# Patient Record
Sex: Female | Born: 1945 | Race: White | Hispanic: No | Smoking: Current every day smoker
Health system: Southern US, Community
[De-identification: ages and names within clinical notes are randomized; demographics above are authoritative.]

## PROBLEM LIST (undated history)

## (undated) DIAGNOSIS — M1990 Unspecified osteoarthritis, unspecified site: Secondary | ICD-10-CM

## (undated) DIAGNOSIS — E7800 Pure hypercholesterolemia, unspecified: Secondary | ICD-10-CM

## (undated) DIAGNOSIS — C801 Malignant (primary) neoplasm, unspecified: Secondary | ICD-10-CM

## (undated) DIAGNOSIS — M069 Rheumatoid arthritis, unspecified: Secondary | ICD-10-CM

## (undated) DIAGNOSIS — H409 Unspecified glaucoma: Secondary | ICD-10-CM

## (undated) DIAGNOSIS — R030 Elevated blood-pressure reading, without diagnosis of hypertension: Secondary | ICD-10-CM

## (undated) DIAGNOSIS — H269 Unspecified cataract: Secondary | ICD-10-CM

## (undated) DIAGNOSIS — E039 Hypothyroidism, unspecified: Secondary | ICD-10-CM

## (undated) DIAGNOSIS — N6019 Diffuse cystic mastopathy of unspecified breast: Secondary | ICD-10-CM

## (undated) DIAGNOSIS — F419 Anxiety disorder, unspecified: Secondary | ICD-10-CM

## (undated) DIAGNOSIS — I10 Essential (primary) hypertension: Secondary | ICD-10-CM

## (undated) DIAGNOSIS — I4892 Unspecified atrial flutter: Secondary | ICD-10-CM

## (undated) DIAGNOSIS — E079 Disorder of thyroid, unspecified: Secondary | ICD-10-CM

## (undated) HISTORY — PX: TONSILLECTOMY: SHX5217

## (undated) HISTORY — DX: Unspecified glaucoma: H40.9

## (undated) HISTORY — DX: Diffuse cystic mastopathy of unspecified breast: N60.19

## (undated) HISTORY — DX: Disorder of thyroid, unspecified: E07.9

## (undated) HISTORY — DX: Rheumatoid arthritis, unspecified: M06.9

## (undated) HISTORY — DX: Essential (primary) hypertension: I10

## (undated) HISTORY — PX: EYE SURGERY: SHX253

## (undated) HISTORY — DX: Elevated blood-pressure reading, without diagnosis of hypertension: R03.0

## (undated) HISTORY — DX: Unspecified cataract: H26.9

## (undated) HISTORY — DX: Unspecified osteoarthritis, unspecified site: M19.90

## (undated) HISTORY — PX: TONSILLECTOMY: SUR1361

## (undated) HISTORY — PX: TUBAL LIGATION: SHX77

## (undated) HISTORY — DX: Pure hypercholesterolemia, unspecified: E78.00

## (undated) HISTORY — DX: Unspecified atrial flutter: I48.92

## (undated) HISTORY — PX: HEMORRHOID SURGERY: SHX153

## (undated) HISTORY — DX: Malignant (primary) neoplasm, unspecified: C80.1

## (undated) HISTORY — PX: ABDOMINAL HYSTERECTOMY: SHX81

---

## 1973-05-23 HISTORY — PX: OTHER SURGICAL HISTORY: SHX169

## 2005-07-28 ENCOUNTER — Ambulatory Visit: Payer: Self-pay | Admitting: Family Medicine

## 2010-06-22 ENCOUNTER — Ambulatory Visit: Payer: Self-pay

## 2011-01-19 ENCOUNTER — Ambulatory Visit: Payer: Self-pay | Admitting: Family Medicine

## 2012-10-22 DIAGNOSIS — M069 Rheumatoid arthritis, unspecified: Secondary | ICD-10-CM | POA: Insufficient documentation

## 2013-06-03 ENCOUNTER — Ambulatory Visit: Payer: Self-pay | Admitting: Rheumatology

## 2013-09-09 ENCOUNTER — Ambulatory Visit: Payer: Self-pay | Admitting: Hematology and Oncology

## 2013-09-20 ENCOUNTER — Ambulatory Visit: Payer: Self-pay | Admitting: Hematology and Oncology

## 2013-09-20 LAB — CBC CANCER CENTER
BASOS PCT: 1.4 %
Basophil #: 0.1 x10 3/mm (ref 0.0–0.1)
EOS ABS: 0.2 x10 3/mm (ref 0.0–0.7)
EOS PCT: 1.8 %
HCT: 46.3 % (ref 35.0–47.0)
HGB: 16.1 g/dL — ABNORMAL HIGH (ref 12.0–16.0)
Lymphocyte #: 2 x10 3/mm (ref 1.0–3.6)
Lymphocyte %: 24 %
MCH: 32.3 pg (ref 26.0–34.0)
MCHC: 34.8 g/dL (ref 32.0–36.0)
MCV: 93 fL (ref 80–100)
MONOS PCT: 6.6 %
Monocyte #: 0.6 x10 3/mm (ref 0.2–0.9)
Neutrophil #: 5.6 x10 3/mm (ref 1.4–6.5)
Neutrophil %: 66.2 %
PLATELETS: 112 x10 3/mm — AB (ref 150–440)
RBC: 4.99 10*6/uL (ref 3.80–5.20)
RDW: 13.1 % (ref 11.5–14.5)
WBC: 8.4 x10 3/mm (ref 3.6–11.0)

## 2013-10-07 DIAGNOSIS — D696 Thrombocytopenia, unspecified: Secondary | ICD-10-CM | POA: Insufficient documentation

## 2013-10-21 ENCOUNTER — Ambulatory Visit: Payer: Self-pay | Admitting: Hematology and Oncology

## 2015-01-27 ENCOUNTER — Telehealth: Payer: Self-pay | Admitting: Family Medicine

## 2015-01-27 DIAGNOSIS — C4490 Unspecified malignant neoplasm of skin, unspecified: Secondary | ICD-10-CM | POA: Insufficient documentation

## 2015-01-27 DIAGNOSIS — M1990 Unspecified osteoarthritis, unspecified site: Secondary | ICD-10-CM | POA: Insufficient documentation

## 2015-01-27 DIAGNOSIS — E039 Hypothyroidism, unspecified: Secondary | ICD-10-CM | POA: Insufficient documentation

## 2015-01-27 DIAGNOSIS — G4700 Insomnia, unspecified: Secondary | ICD-10-CM | POA: Insufficient documentation

## 2015-01-27 DIAGNOSIS — E7800 Pure hypercholesterolemia, unspecified: Secondary | ICD-10-CM | POA: Insufficient documentation

## 2015-01-27 DIAGNOSIS — M069 Rheumatoid arthritis, unspecified: Secondary | ICD-10-CM | POA: Insufficient documentation

## 2015-01-27 DIAGNOSIS — R030 Elevated blood-pressure reading, without diagnosis of hypertension: Secondary | ICD-10-CM | POA: Insufficient documentation

## 2015-01-27 DIAGNOSIS — F4320 Adjustment disorder, unspecified: Secondary | ICD-10-CM | POA: Insufficient documentation

## 2015-01-27 DIAGNOSIS — R748 Abnormal levels of other serum enzymes: Secondary | ICD-10-CM | POA: Insufficient documentation

## 2015-01-27 DIAGNOSIS — J309 Allergic rhinitis, unspecified: Secondary | ICD-10-CM | POA: Insufficient documentation

## 2015-01-27 DIAGNOSIS — D72829 Elevated white blood cell count, unspecified: Secondary | ICD-10-CM | POA: Insufficient documentation

## 2015-01-27 DIAGNOSIS — Z720 Tobacco use: Secondary | ICD-10-CM | POA: Insufficient documentation

## 2015-01-27 DIAGNOSIS — N6019 Diffuse cystic mastopathy of unspecified breast: Secondary | ICD-10-CM | POA: Insufficient documentation

## 2015-01-27 NOTE — Telephone Encounter (Signed)
Pt called sayin gyou wanted to recheck her thyroid.  She needs a lab slip to be picked up.  Call back is [phone]  Thanks teri

## 2015-01-27 NOTE — Telephone Encounter (Signed)
Pt's Synthroid was increase 09/2014.  Also her liver enzymes and cholesterol were elevated.  She didn't start want to start a statin yet, and she had an U/S that showed fatty liver.  Should her cholesterol and met C be recheck?  Thanks,   -Mickel Baas

## 2015-01-27 NOTE — Telephone Encounter (Signed)
Pt advised that her lab slip is at the front desk.   Thanks,   -Mickel Baas

## 2015-01-27 NOTE — Telephone Encounter (Signed)
OK to order all of the labs as below. Thanks.

## 2015-02-03 ENCOUNTER — Telehealth: Payer: Self-pay

## 2015-02-03 DIAGNOSIS — E039 Hypothyroidism, unspecified: Secondary | ICD-10-CM

## 2015-02-03 LAB — LIPID PANEL
CHOL/HDL RATIO: 6.5 ratio — AB (ref 0.0–4.4)
Cholesterol, Total: 239 mg/dL — ABNORMAL HIGH (ref 100–199)
HDL: 37 mg/dL — AB (ref >39)
LDL Calculated: 157 mg/dL — ABNORMAL HIGH (ref 0–99)
Triglycerides: 227 mg/dL — ABNORMAL HIGH (ref 0–149)
VLDL CHOLESTEROL CAL: 45 mg/dL — AB (ref 5–40)

## 2015-02-03 LAB — COMPREHENSIVE METABOLIC PANEL
ALT: 25 IU/L (ref 0–32)
AST: 30 IU/L (ref 0–40)
Albumin/Globulin Ratio: 1.7 (ref 1.1–2.5)
Albumin: 4.4 g/dL (ref 3.5–4.8)
Alkaline Phosphatase: 102 IU/L (ref 39–117)
BUN/Creatinine Ratio: 11 (ref 11–26)
BUN: 10 mg/dL (ref 8–27)
Bilirubin Total: 0.5 mg/dL (ref 0.0–1.2)
CALCIUM: 9.1 mg/dL (ref 8.7–10.3)
CO2: 22 mmol/L (ref 18–29)
CREATININE: 0.88 mg/dL (ref 0.57–1.00)
Chloride: 102 mmol/L (ref 97–108)
GFR, EST AFRICAN AMERICAN: 77 mL/min/{1.73_m2} (ref >59)
GFR, EST NON AFRICAN AMERICAN: 67 mL/min/{1.73_m2} (ref >59)
GLUCOSE: 103 mg/dL — AB (ref 65–99)
Globulin, Total: 2.6 g/dL (ref 1.5–4.5)
Potassium: 4.7 mmol/L (ref 3.5–5.2)
Sodium: 142 mmol/L (ref 134–144)
TOTAL PROTEIN: 7 g/dL (ref 6.0–8.5)

## 2015-02-03 LAB — TSH: TSH: 1.88 u[IU]/mL (ref 0.450–4.500)

## 2015-02-03 MED ORDER — LEVOTHYROXINE SODIUM 75 MCG PO TABS: 75.0000 ug | ORAL_TABLET | Freq: Every day | ORAL | Status: DC

## 2015-02-03 NOTE — Telephone Encounter (Signed)
Advised pt as directed below. Pt stated that she wants to wait a month before deciding because she has an appointment with Dr. Jefm Camberos next month for her arthritis. She stated that she will call [facility] to let us know.  She is also requesting synthroid medication refill.  Thanks,

## 2015-02-03 NOTE — Telephone Encounter (Signed)
-----   Message from Margarita Rana, MD sent at 02/03/2015  8:09 AM EDT ----- Blood sugar borderline.  Thyroid normal. Liver enzymes normal. Cholesterol elevated and 10 year risk of heart disease is 15 percent. Please see if patient would consider a low dose statin. Know she used to be on Zocor. Thanks.

## 2015-05-27 ENCOUNTER — Other Ambulatory Visit: Payer: Self-pay | Admitting: Family Medicine

## 2015-05-27 DIAGNOSIS — E039 Hypothyroidism, unspecified: Secondary | ICD-10-CM

## 2015-05-28 NOTE — Telephone Encounter (Signed)
Last OV 09/2014  Thanks,   -Laura  

## 2015-07-30 DIAGNOSIS — M2041 Other hammer toe(s) (acquired), right foot: Secondary | ICD-10-CM | POA: Diagnosis not present

## 2015-07-30 DIAGNOSIS — M2010 Hallux valgus (acquired), unspecified foot: Secondary | ICD-10-CM | POA: Diagnosis not present

## 2015-07-30 DIAGNOSIS — M79671 Pain in right foot: Secondary | ICD-10-CM | POA: Diagnosis not present

## 2015-07-30 DIAGNOSIS — M79672 Pain in left foot: Secondary | ICD-10-CM | POA: Diagnosis not present

## 2015-07-30 DIAGNOSIS — M2042 Other hammer toe(s) (acquired), left foot: Secondary | ICD-10-CM | POA: Diagnosis not present

## 2015-08-10 DIAGNOSIS — M0579 Rheumatoid arthritis with rheumatoid factor of multiple sites without organ or systems involvement: Secondary | ICD-10-CM | POA: Diagnosis not present

## 2015-08-10 DIAGNOSIS — Z79899 Other long term (current) drug therapy: Secondary | ICD-10-CM | POA: Diagnosis not present

## 2015-08-13 ENCOUNTER — Encounter: Payer: Self-pay | Admitting: Physician Assistant

## 2015-08-13 ENCOUNTER — Ambulatory Visit (INDEPENDENT_AMBULATORY_CARE_PROVIDER_SITE_OTHER): Payer: PPO | Admitting: Physician Assistant

## 2015-08-13 VITALS — BP 140/70 | HR 83 | Temp 98.8°F | Resp 16 | Wt 114.4 lb

## 2015-08-13 DIAGNOSIS — J069 Acute upper respiratory infection, unspecified: Secondary | ICD-10-CM

## 2015-08-13 DIAGNOSIS — R05 Cough: Secondary | ICD-10-CM

## 2015-08-13 DIAGNOSIS — R059 Cough, unspecified: Secondary | ICD-10-CM

## 2015-08-13 MED ORDER — HYDROCODONE-HOMATROPINE 5-1.5 MG/5ML PO SYRP: 5.0000 mL | ORAL_SOLUTION | Freq: Three times a day (TID) | ORAL | Status: DC | PRN

## 2015-08-13 MED ORDER — AZITHROMYCIN 250 MG PO TABS: ORAL_TABLET | ORAL | Status: DC

## 2015-08-13 NOTE — Patient Instructions (Signed)
Upper Respiratory Infection, Adult Most upper respiratory infections (URIs) are a viral infection of the air passages leading to the lungs. A URI affects the nose, throat, and upper air passages. The most common type of URI is nasopharyngitis and is typically referred to as "the common cold." URIs run their course and usually go away on their own. Most of the time, a URI does not require medical attention, but sometimes a bacterial infection in the upper airways can follow a viral infection. This is called a secondary infection. Sinus and middle ear infections are common types of secondary upper respiratory infections. Bacterial pneumonia can also complicate a URI. A URI can worsen asthma and chronic obstructive pulmonary disease (COPD). Sometimes, these complications can require emergency medical care and may be life threatening.  CAUSES Almost all URIs are caused by viruses. A virus is a type of germ and can spread from one person to another.  RISKS FACTORS You may be at risk for a URI if:   You smoke.   You have chronic heart or lung disease.  You have a weakened defense (immune) system.   You are very young or very old.   You have nasal allergies or asthma.  You work in crowded or poorly ventilated areas.  You work in health care facilities or schools. SIGNS AND SYMPTOMS  Symptoms typically develop 2-3 days after you come in contact with a cold virus. Most viral URIs last 7-10 days. However, viral URIs from the influenza virus (flu virus) can last 14-18 days and are typically more severe. Symptoms may include:   Runny or stuffy (congested) nose.   Sneezing.   Cough.   Sore throat.   Headache.   Fatigue.   Fever.   Loss of appetite.   Pain in your forehead, behind your eyes, and over your cheekbones (sinus pain).  Muscle aches.  DIAGNOSIS  Your health care provider may diagnose a URI by:  Physical exam.  Tests to check that your symptoms are not due to  another condition such as:  Strep throat.  Sinusitis.  Pneumonia.  Asthma. TREATMENT  A URI goes away on its own with time. It cannot be cured with medicines, but medicines may be prescribed or recommended to relieve symptoms. Medicines may help:  Reduce your fever.  Reduce your cough.  Relieve nasal congestion. HOME CARE INSTRUCTIONS   Take medicines only as directed by your health care provider.   Gargle warm saltwater or take cough drops to comfort your throat as directed by your health care provider.  Use a warm mist humidifier or inhale steam from a shower to increase air moisture. This may make it easier to breathe.  Drink enough fluid to keep your urine clear or pale yellow.   Eat soups and other clear broths and maintain good nutrition.   Rest as needed.   Return to work when your temperature has returned to normal or as your health care provider advises. You may need to stay home longer to avoid infecting others. You can also use a face mask and careful hand washing to prevent spread of the virus.  Increase the usage of your inhaler if you have asthma.   Do not use any tobacco products, including cigarettes, chewing tobacco, or electronic cigarettes. If you need help quitting, ask your health care provider. PREVENTION  The best way to protect yourself from getting a cold is to practice good hygiene.   Avoid oral or hand contact with people with cold   symptoms.   Wash your hands often if contact occurs.  There is no clear evidence that vitamin C, vitamin E, echinacea, or exercise reduces the chance of developing a cold. However, it is always recommended to get plenty of rest, exercise, and practice good nutrition.  SEEK MEDICAL CARE IF:   You are getting worse rather than better.   Your symptoms are not controlled by medicine.   You have chills.  You have worsening shortness of breath.  You have brown or red mucus.  You have yellow or brown nasal  discharge.  You have pain in your face, especially when you bend forward.  You have a fever.  You have swollen neck glands.  You have pain while swallowing.  You have white areas in the back of your throat. SEEK IMMEDIATE MEDICAL CARE IF:   You have severe or persistent:  Headache.  Ear pain.  Sinus pain.  Chest pain.  You have chronic lung disease and any of the following:  Wheezing.  Prolonged cough.  Coughing up blood.  A change in your usual mucus.  You have a stiff neck.  You have changes in your:  Vision.  Hearing.  Thinking.  Mood. MAKE SURE YOU:   Understand these instructions.  Will watch your condition.  Will get help right away if you are not doing well or get worse.   This information is not intended to replace advice given to you by your health care provider. Make sure you discuss any questions you have with your health care provider.   Document Released: 11/02/2000 Document Revised: 09/23/2014 Document Reviewed: 08/14/2013 Elsevier Interactive Patient Education 2016 Elsevier Inc.  

## 2015-08-13 NOTE — Progress Notes (Signed)
Patient: Tamara Whitehead Female    DOB: 02-06-1946   70 y.o.   MRN: [id number] Visit Date: 08/13/2015  Today's Provider: Mar Daring, PA-C   Chief Complaint  Patient presents with  . URI   Subjective:    URI  This is a new problem. The current episode started in the past 7 days (Monday night). The problem has been gradually worsening. Maximum temperature: Low grade fever; unmeasured. Associated symptoms include congestion, coughing, ear pain (right ear  in the last few days but it feels better), nausea, rhinorrhea, sinus pain, sneezing and a sore throat (scratchy from the postnasal drip on and off). Pertinent negatives include no abdominal pain, chest pain, diarrhea, headaches, neck pain, plugged ear sensation, rash, vomiting or wheezing. She has tried decongestant and NSAIDs (Mucinex, Robitussin cough syrup, Advil, and Aleve) for the symptoms. The treatment provided no relief.   She does have RA and is getting ready to start humira.  She is worried that if she is sick she will not be able to start her humira.  She is currently on other immunosuppressants but they are not offering her full relief.  She also does have seasonal allergies.  She takes Human resources officer as needed. She has not yet started her allergy medication back at this time.  She also is a smoker.  She does have a history of bronchitis and sinus infections.    Allergies  Allergen Reactions  . Codeine Nausea And Vomiting    Can take cough syrups   Previous Medications   CALCIUM CARBONATE (CALCIUM 600 PO)    Take 600 mg by mouth daily.   COENZYME Q-10 100 MG CAPSULE    Take 100 mg by mouth daily.   FEXOFENADINE (ALLEGRA ALLERGY) 180 MG TABLET    Take by mouth.   FOLIC ACID (FOLVITE) 443 MCG TABLET    Take by mouth.   HYDROXYCHLOROQUINE (PLAQUENIL) 200 MG TABLET    TK 2 TS PO ONCE DAILY   IBUPROFEN (ADVIL,MOTRIN) 200 MG TABLET    Take by mouth.   LATANOPROST (XALATAN) 0.005 % OPHTHALMIC SOLUTION    Apply to eye.   LEFLUNOMIDE (ARAVA) 10 MG TABLET    Take by mouth.   LEVOTHYROXINE (SYNTHROID, LEVOTHROID) 75 MCG TABLET    TAKE ONE TABLET BY MOUTH EVERY DAY   MULTIPLE VITAMIN TABLET    Take by mouth.   NAPROXEN SODIUM (ALEVE) 220 MG TABLET    Take 220 mg by mouth 2 (two) times daily with a meal.   VITAMIN C (ASCORBIC ACID) 500 MG TABLET    Take by mouth.   VITAMIN E 400 UNIT CAPSULE    Take by mouth.    Review of Systems  Constitutional: Positive for chills.  HENT: Positive for congestion, ear pain (right ear  in the last few days but it feels better), postnasal drip, rhinorrhea, sinus pressure, sneezing and sore throat (scratchy from the postnasal drip on and off). Negative for trouble swallowing.   Respiratory: Positive for cough, chest tightness and shortness of breath (a little). Negative for wheezing.   Cardiovascular: Negative for chest pain, palpitations and leg swelling.  Gastrointestinal: Positive for nausea. Negative for vomiting, abdominal pain and diarrhea.  Musculoskeletal: Negative for neck pain.  Skin: Negative for rash.  Neurological: Negative for dizziness and headaches.    Social History  Substance Use Topics  . Smoking status: Current Every Day Smoker -- 0.25 packs/day    Types: Cigarettes  .  Smokeless tobacco: Not on file  . Alcohol Use: Yes     Comment: wine occasional   Objective:   BP 140/70 mmHg  Pulse 83  Temp(Src) 98.8 F (37.1 C) (Oral)  Resp 16  Wt 114 lb 6.4 oz (51.891 kg)  SpO2 96%  Physical Exam  Constitutional: She appears well-developed and well-nourished. No distress.  HENT:  Head: Normocephalic and atraumatic.  Right Ear: Hearing, external ear and ear canal normal. Tympanic membrane is not erythematous and not bulging. A middle ear effusion is present.  Left Ear: Hearing, external ear and ear canal normal. Tympanic membrane is not erythematous and not bulging. A middle ear effusion is present.  Nose: Mucosal edema and rhinorrhea present. Right sinus  exhibits no maxillary sinus tenderness and no frontal sinus tenderness. Left sinus exhibits no maxillary sinus tenderness and no frontal sinus tenderness.  Mouth/Throat: Uvula is midline, oropharynx is clear and moist and mucous membranes are normal. No oropharyngeal exudate, posterior oropharyngeal edema or posterior oropharyngeal erythema.  Eyes: Conjunctivae are normal. Pupils are equal, round, and reactive to light. Right eye exhibits no discharge. Left eye exhibits no discharge. No scleral icterus.  Neck: Normal range of motion. Neck supple. No tracheal deviation present. No thyromegaly present.  Cardiovascular: Normal rate, regular rhythm and normal heart sounds.  Exam reveals no gallop and no friction rub.   No murmur heard. Pulmonary/Chest: Effort normal and breath sounds normal. No stridor. No respiratory distress. She has no wheezes. She has no rales.  Lymphadenopathy:    She has no cervical adenopathy.  Skin: Skin is warm and dry. She is not diaphoretic.  Vitals reviewed.       Assessment & Plan:     1. Upper respiratory infection Worsening symptoms. Due to her immunosuppression treatment for her rheumatoid arthritis I will treat her with a Z-Pak as below. I did advise her to go ahead and start her Allegra as well being that allergy season is already started. She may continue Mucinex DM for congestion and daytime cough. I will give Hycodan cough syrup as below for nighttime cough. She needs to make sure she stays well hydrated. She may use Tylenol as needed for fever and body aches. She is to call the office if symptoms fail to improve or worsen. - azithromycin (ZITHROMAX) 250 MG tablet; Take 2 tablets PO on day one, and one tablet PO daily thereafter until completed.  Dispense: 6 tablet; Refill: 0  2. Cough See above medical treatment plan. - HYDROcodone-homatropine (HYCODAN) 5-1.5 MG/5ML syrup; Take 5 mLs by mouth every 8 (eight) hours as needed.  Dispense: 180 mL; Refill: 0         Mar Daring, PA-C  [facility]

## 2015-08-17 DIAGNOSIS — M0579 Rheumatoid arthritis with rheumatoid factor of multiple sites without organ or systems involvement: Secondary | ICD-10-CM | POA: Diagnosis not present

## 2015-08-17 DIAGNOSIS — D696 Thrombocytopenia, unspecified: Secondary | ICD-10-CM | POA: Diagnosis not present

## 2015-08-17 DIAGNOSIS — Z79899 Other long term (current) drug therapy: Secondary | ICD-10-CM | POA: Diagnosis not present

## 2015-11-16 DIAGNOSIS — M0579 Rheumatoid arthritis with rheumatoid factor of multiple sites without organ or systems involvement: Secondary | ICD-10-CM | POA: Diagnosis not present

## 2015-11-16 DIAGNOSIS — Z79899 Other long term (current) drug therapy: Secondary | ICD-10-CM | POA: Diagnosis not present

## 2015-11-30 DIAGNOSIS — D751 Secondary polycythemia: Secondary | ICD-10-CM | POA: Diagnosis not present

## 2015-11-30 DIAGNOSIS — D696 Thrombocytopenia, unspecified: Secondary | ICD-10-CM | POA: Diagnosis not present

## 2015-11-30 DIAGNOSIS — M0579 Rheumatoid arthritis with rheumatoid factor of multiple sites without organ or systems involvement: Secondary | ICD-10-CM | POA: Diagnosis not present

## 2015-12-05 ENCOUNTER — Other Ambulatory Visit: Payer: Self-pay | Admitting: Family Medicine

## 2015-12-05 DIAGNOSIS — E039 Hypothyroidism, unspecified: Secondary | ICD-10-CM

## 2016-03-28 DIAGNOSIS — M0579 Rheumatoid arthritis with rheumatoid factor of multiple sites without organ or systems involvement: Secondary | ICD-10-CM | POA: Diagnosis not present

## 2016-03-28 DIAGNOSIS — D696 Thrombocytopenia, unspecified: Secondary | ICD-10-CM | POA: Diagnosis not present

## 2016-03-28 DIAGNOSIS — D751 Secondary polycythemia: Secondary | ICD-10-CM | POA: Diagnosis not present

## 2016-03-28 DIAGNOSIS — Z79899 Other long term (current) drug therapy: Secondary | ICD-10-CM | POA: Diagnosis not present

## 2016-04-04 DIAGNOSIS — D696 Thrombocytopenia, unspecified: Secondary | ICD-10-CM | POA: Diagnosis not present

## 2016-04-04 DIAGNOSIS — M79671 Pain in right foot: Secondary | ICD-10-CM | POA: Diagnosis not present

## 2016-04-04 DIAGNOSIS — M0579 Rheumatoid arthritis with rheumatoid factor of multiple sites without organ or systems involvement: Secondary | ICD-10-CM | POA: Diagnosis not present

## 2016-04-04 DIAGNOSIS — G8929 Other chronic pain: Secondary | ICD-10-CM | POA: Diagnosis not present

## 2016-05-25 DIAGNOSIS — H401131 Primary open-angle glaucoma, bilateral, mild stage: Secondary | ICD-10-CM | POA: Diagnosis not present

## 2016-06-19 ENCOUNTER — Other Ambulatory Visit: Payer: Self-pay | Admitting: Physician Assistant

## 2016-06-19 DIAGNOSIS — E039 Hypothyroidism, unspecified: Secondary | ICD-10-CM

## 2016-06-20 NOTE — Telephone Encounter (Signed)
Last ov 08/13/15 Last filled 12/06/05 Please review. Thank you. sd

## 2016-10-05 ENCOUNTER — Encounter: Payer: PPO | Admitting: Physician Assistant

## 2016-10-05 DIAGNOSIS — M0579 Rheumatoid arthritis with rheumatoid factor of multiple sites without organ or systems involvement: Secondary | ICD-10-CM | POA: Diagnosis not present

## 2016-10-10 DIAGNOSIS — M0579 Rheumatoid arthritis with rheumatoid factor of multiple sites without organ or systems involvement: Secondary | ICD-10-CM | POA: Diagnosis not present

## 2016-10-10 DIAGNOSIS — M79642 Pain in left hand: Secondary | ICD-10-CM | POA: Diagnosis not present

## 2016-10-10 DIAGNOSIS — D696 Thrombocytopenia, unspecified: Secondary | ICD-10-CM | POA: Diagnosis not present

## 2016-11-07 ENCOUNTER — Encounter: Payer: Self-pay | Admitting: Family Medicine

## 2016-11-07 ENCOUNTER — Ambulatory Visit (INDEPENDENT_AMBULATORY_CARE_PROVIDER_SITE_OTHER): Payer: PPO | Admitting: Family Medicine

## 2016-11-07 VITALS — BP 160/84 | HR 85 | Temp 99.5°F | Ht 61.0 in | Wt 116.8 lb

## 2016-11-07 DIAGNOSIS — R030 Elevated blood-pressure reading, without diagnosis of hypertension: Secondary | ICD-10-CM | POA: Diagnosis not present

## 2016-11-07 DIAGNOSIS — E039 Hypothyroidism, unspecified: Secondary | ICD-10-CM

## 2016-11-07 DIAGNOSIS — L989 Disorder of the skin and subcutaneous tissue, unspecified: Secondary | ICD-10-CM | POA: Diagnosis not present

## 2016-11-07 DIAGNOSIS — M069 Rheumatoid arthritis, unspecified: Secondary | ICD-10-CM | POA: Diagnosis not present

## 2016-11-07 DIAGNOSIS — Z13220 Encounter for screening for lipoid disorders: Secondary | ICD-10-CM | POA: Diagnosis not present

## 2016-11-07 NOTE — Assessment & Plan Note (Signed)
Return for TSH.

## 2016-11-07 NOTE — Assessment & Plan Note (Signed)
She does have 2 seborrheic keratoses. Potentially cutaneous horns on her right arm. We'll have her see dermatology to ensure no malignant process.

## 2016-11-07 NOTE — Assessment & Plan Note (Signed)
Elevated today. I have reviewed her last several vital signs through rheumatology and all her blood pressures are normal. She reports elevated blood pressures in the past when she has been anxious the doctor's offices. Discuss having her check at home and if it starts to become elevated letting us know.

## 2016-11-07 NOTE — Assessment & Plan Note (Signed)
Followed by rheumatology. Continue to follow them.

## 2016-11-07 NOTE — Patient Instructions (Signed)
Nice to meet you. We will have you return for fasting lab work. We'll get you referred to dermatology.

## 2016-11-07 NOTE — Progress Notes (Signed)
Tommi Rumps, MD Phone: [phone]  Tamara Whitehead is a 71 y.o. female who presents today for new patient visit.  Hypothyroidism: Taking Synthroid. No weight changes. No heat or cold intolerance.  Rheumatoid arthritis: Followed by rheumatology. Notes this has been getting a little worse. She didn't do very well with the cold weather this last winter. Currently on Plaquenil and Humira.  Glaucoma: Currently following with ophthalmology. Currently on eyedrops. No recent changes.  No history of hypertension. Blood pressure is typically well controlled. Most recent check at rheumatology was in the 120s over 53s.  Patient notes some rough areas on her skin. They've come up relatively recently. She does have a history of skin cancer removed from her neck previously. Does have several seborrheic keratoses as well.  Active Ambulatory Problems    Diagnosis Date Noted  . Adaptation reaction 01/27/2015  . Allergic rhinitis 01/27/2015  . Current tobacco use 01/27/2015  . Elevated blood-pressure reading without diagnosis of hypertension 01/27/2015  . Leukocytosis 01/27/2015  . Bloodgood disease 01/27/2015  . Hypercholesteremia 01/27/2015  . Adult hypothyroidism 01/27/2015  . Cannot sleep 01/27/2015  . Osteoarthrosis 01/27/2015  . Arthritis or polyarthritis, rheumatoid ([place]) 01/27/2015  . Malignant neoplasm of skin 01/27/2015  . Elevated liver enzymes 01/27/2015  . Thrombocytopenia ([place]) 10/07/2013  . Rheumatoid arthritis ([place]) 10/22/2012  . Skin lesion 11/07/2016   Resolved Ambulatory Problems    Diagnosis Date Noted  . No Resolved Ambulatory Problems   Past Medical History:  Diagnosis Date  . Arthritis   . Cancer ([place])   . Cataract   . Elevated blood pressure   . Fibrocystic breast disease   . Glaucoma   . Hypercholesterolemia   . Thyroid disease     Family History  Problem Relation Age of Onset  . CAD Mother   . Arthritis Mother        Rheumatoid Arthritis  .  Arthritis Father        Rheumatoid/Osteoarthritis and Blindness, bleeding Tendencies,Glaucoma  . CAD Father   . Hypertension Maternal Grandmother   . COPD Neg Hx     Social History   Social History  . Marital status: Single    Spouse name: N/A  . Number of children: N/A  . Years of education: N/A   Occupational History  . Not on file.   Social History Main Topics  . Smoking status: Current Every Day Smoker    Packs/day: 0.25    Types: Cigarettes  . Smokeless tobacco: Never Used  . Alcohol use Yes     Comment: wine occasional  . Drug use: No  . Sexual activity: Not on file   Other Topics Concern  . Not on file   Social History Narrative  . No narrative on file    ROS  General:  Negative for nexplained weight loss, fever Skin: Negative for new or changing mole, sore that won't heal HEENT: Negative for trouble hearing, trouble seeing, ringing in ears, mouth sores, hoarseness, change in voice, dysphagia. CV:  Negative for chest pain, dyspnea, edema, palpitations Resp: Negative for cough, dyspnea, hemoptysis GI: Negative for nausea, vomiting, diarrhea, constipation, abdominal pain, melena, hematochezia. GU: Negative for dysuria, incontinence, urinary hesitance, hematuria, vaginal or penile discharge, polyuria, sexual difficulty, lumps in testicle or breasts MSK: Negative for muscle cramps or aches, joint pain or swelling Neuro: Negative for headaches, weakness, numbness, dizziness, passing out/fainting Psych: Negative for depression, anxiety, memory problems  Objective  Physical Exam Vitals:   11/07/16 1420  BP: Marland Kitchen)  160/84  Pulse: 85  Temp: 99.5 F (37.5 C)    BP Readings from Last 3 Encounters:  11/07/16 (!) 160/84  08/13/15 140/70   Wt Readings from Last 3 Encounters:  11/07/16 116 lb 12.8 oz (53 kg)  08/13/15 114 lb 6.4 oz (51.9 kg)    Physical Exam  Constitutional: No distress.  HENT:  Head: Normocephalic and atraumatic.  Mouth/Throat: Oropharynx  is clear and moist. No oropharyngeal exudate.  Eyes: Conjunctivae are normal. Pupils are equal, round, and reactive to light.  Cardiovascular: Normal rate, regular rhythm and normal heart sounds.   Pulmonary/Chest: Effort normal and breath sounds normal.  Abdominal: Soft. Bowel sounds are normal. She exhibits no distension. There is no tenderness. There is no rebound and no guarding.  Musculoskeletal: She exhibits no edema.  Neurological: She is alert. Gait normal.  Skin: Skin is warm and dry. She is not diaphoretic.  Seborrheic keratosis on posterior aspect of each knee, possible cutaneous horn is still right dorsal forearm and proximal right bicep  Psychiatric: Mood and affect normal.     Assessment/Plan:   Adult hypothyroidism Return for TSH.  Rheumatoid arthritis ([place]) Followed by rheumatology. Continue to follow them.  Elevated blood-pressure reading without diagnosis of hypertension Elevated today. I have reviewed her last several vital signs through rheumatology and all her blood pressures are normal. She reports elevated blood pressures in the past when she has been anxious the doctor's offices. Discuss having her check at home and if it starts to become elevated letting us know.  Skin lesion She does have 2 seborrheic keratoses. Potentially cutaneous horns on her right arm. We'll have her see dermatology to ensure no malignant process.   Orders Placed This Encounter  Procedures  . TSH    Standing Status:   Future    Standing Expiration Date:   11/07/2017  . Comp Met (CMET)    Standing Status:   Future    Standing Expiration Date:   11/07/2017  . Lipid panel    Standing Status:   Future    Standing Expiration Date:   11/07/2017  . Ambulatory referral to Dermatology    Referral Priority:   Routine    Referral Type:   Consultation    Referral Reason:   Specialty Services Required    Requested Specialty:   Dermatology    Number of Visits Requested:   1   Tommi Rumps, MD [place]

## 2016-11-14 ENCOUNTER — Other Ambulatory Visit (INDEPENDENT_AMBULATORY_CARE_PROVIDER_SITE_OTHER): Payer: PPO

## 2016-11-14 DIAGNOSIS — Z13220 Encounter for screening for lipoid disorders: Secondary | ICD-10-CM

## 2016-11-14 DIAGNOSIS — E039 Hypothyroidism, unspecified: Secondary | ICD-10-CM

## 2016-11-14 LAB — COMPREHENSIVE METABOLIC PANEL
ALK PHOS: 71 U/L (ref 39–117)
ALT: 27 U/L (ref 0–35)
AST: 32 U/L (ref 0–37)
Albumin: 4.5 g/dL (ref 3.5–5.2)
BUN: 17 mg/dL (ref 6–23)
CO2: 26 mEq/L (ref 19–32)
Calcium: 9.8 mg/dL (ref 8.4–10.5)
Chloride: 106 mEq/L (ref 96–112)
Creatinine, Ser: 0.99 mg/dL (ref 0.40–1.20)
GFR: 58.63 mL/min — ABNORMAL LOW (ref >60.00)
GLUCOSE: 113 mg/dL — AB (ref 70–99)
POTASSIUM: 4.5 meq/L (ref 3.5–5.1)
Sodium: 138 mEq/L (ref 135–145)
TOTAL PROTEIN: 7.2 g/dL (ref 6.0–8.3)
Total Bilirubin: 0.4 mg/dL (ref 0.2–1.2)

## 2016-11-14 LAB — LIPID PANEL
Cholesterol: 244 mg/dL — ABNORMAL HIGH (ref 0–200)
HDL: 41.2 mg/dL (ref >39.00)
LDL Cholesterol: 180 mg/dL — ABNORMAL HIGH (ref 0–99)
NONHDL: 202.68
Total CHOL/HDL Ratio: 6
Triglycerides: 111 mg/dL (ref 0.0–149.0)
VLDL: 22.2 mg/dL (ref 0.0–40.0)

## 2016-11-14 LAB — TSH: TSH: 1.54 u[IU]/mL (ref 0.35–4.50)

## 2016-11-16 ENCOUNTER — Other Ambulatory Visit: Payer: Self-pay | Admitting: Family Medicine

## 2016-11-16 DIAGNOSIS — E785 Hyperlipidemia, unspecified: Secondary | ICD-10-CM

## 2016-11-16 DIAGNOSIS — R7309 Other abnormal glucose: Secondary | ICD-10-CM

## 2016-11-16 MED ORDER — ROSUVASTATIN CALCIUM 20 MG PO TABS: 20.0000 mg | ORAL_TABLET | Freq: Every day | ORAL | 3 refills | Status: DC

## 2016-12-05 DIAGNOSIS — H401131 Primary open-angle glaucoma, bilateral, mild stage: Secondary | ICD-10-CM | POA: Diagnosis not present

## 2016-12-12 ENCOUNTER — Other Ambulatory Visit: Payer: Self-pay | Admitting: Family Medicine

## 2016-12-12 ENCOUNTER — Other Ambulatory Visit (INDEPENDENT_AMBULATORY_CARE_PROVIDER_SITE_OTHER): Payer: PPO

## 2016-12-12 DIAGNOSIS — E785 Hyperlipidemia, unspecified: Secondary | ICD-10-CM

## 2016-12-12 DIAGNOSIS — R945 Abnormal results of liver function studies: Principal | ICD-10-CM

## 2016-12-12 DIAGNOSIS — R7309 Other abnormal glucose: Secondary | ICD-10-CM

## 2016-12-12 DIAGNOSIS — R7989 Other specified abnormal findings of blood chemistry: Secondary | ICD-10-CM

## 2016-12-12 LAB — HEPATIC FUNCTION PANEL
ALT: 42 U/L — AB (ref 0–35)
AST: 42 U/L — ABNORMAL HIGH (ref 0–37)
Albumin: 4.7 g/dL (ref 3.5–5.2)
Alkaline Phosphatase: 53 U/L (ref 39–117)
BILIRUBIN TOTAL: 0.8 mg/dL (ref 0.2–1.2)
Bilirubin, Direct: 0.1 mg/dL (ref 0.0–0.3)
Total Protein: 7.2 g/dL (ref 6.0–8.3)

## 2016-12-12 LAB — LDL CHOLESTEROL, DIRECT: LDL DIRECT: 59 mg/dL

## 2016-12-12 LAB — HEMOGLOBIN A1C: Hgb A1c MFr Bld: 5.3 % (ref 4.6–6.5)

## 2016-12-27 ENCOUNTER — Telehealth: Payer: Self-pay | Admitting: *Deleted

## 2016-12-27 DIAGNOSIS — E039 Hypothyroidism, unspecified: Secondary | ICD-10-CM

## 2016-12-27 MED ORDER — LEVOTHYROXINE SODIUM 75 MCG PO TABS: 75.0000 ug | ORAL_TABLET | Freq: Every day | ORAL | 1 refills | Status: DC

## 2016-12-27 NOTE — Telephone Encounter (Signed)
Pt requested a medication refill for levothyroxine  Pharmacy [facility] [address]

## 2017-01-09 ENCOUNTER — Other Ambulatory Visit (INDEPENDENT_AMBULATORY_CARE_PROVIDER_SITE_OTHER): Payer: PPO

## 2017-01-09 DIAGNOSIS — R7989 Other specified abnormal findings of blood chemistry: Secondary | ICD-10-CM

## 2017-01-09 DIAGNOSIS — R945 Abnormal results of liver function studies: Principal | ICD-10-CM

## 2017-01-09 LAB — HEPATIC FUNCTION PANEL
ALT: 40 U/L — AB (ref 0–35)
AST: 39 U/L — AB (ref 0–37)
Albumin: 4.3 g/dL (ref 3.5–5.2)
Alkaline Phosphatase: 58 U/L (ref 39–117)
BILIRUBIN DIRECT: 0.2 mg/dL (ref 0.0–0.3)
BILIRUBIN TOTAL: 0.7 mg/dL (ref 0.2–1.2)
TOTAL PROTEIN: 7.8 g/dL (ref 6.0–8.3)

## 2017-01-14 ENCOUNTER — Other Ambulatory Visit: Payer: Self-pay | Admitting: Family Medicine

## 2017-01-14 DIAGNOSIS — R945 Abnormal results of liver function studies: Principal | ICD-10-CM

## 2017-01-14 DIAGNOSIS — R7989 Other specified abnormal findings of blood chemistry: Secondary | ICD-10-CM

## 2017-01-16 NOTE — Progress Notes (Signed)
Pt scheduled for 9/10 @ 8:30 am-mychart message sent with appt info

## 2017-01-30 ENCOUNTER — Ambulatory Visit
Admission: RE | Admit: 2017-01-30 | Discharge: 2017-01-30 | Disposition: A | Discharge status: Home / Self Care | Payer: PPO | Source: Ambulatory Visit | Attending: Family Medicine | Admitting: Family Medicine

## 2017-01-30 DIAGNOSIS — K838 Other specified diseases of biliary tract: Secondary | ICD-10-CM | POA: Diagnosis not present

## 2017-01-30 DIAGNOSIS — R945 Abnormal results of liver function studies: Secondary | ICD-10-CM

## 2017-01-30 DIAGNOSIS — R7989 Other specified abnormal findings of blood chemistry: Secondary | ICD-10-CM | POA: Diagnosis not present

## 2017-01-31 ENCOUNTER — Telehealth: Payer: Self-pay | Admitting: Family Medicine

## 2017-01-31 ENCOUNTER — Other Ambulatory Visit: Payer: Self-pay | Admitting: Family Medicine

## 2017-01-31 DIAGNOSIS — K838 Other specified diseases of biliary tract: Secondary | ICD-10-CM

## 2017-01-31 DIAGNOSIS — R945 Abnormal results of liver function studies: Principal | ICD-10-CM

## 2017-01-31 DIAGNOSIS — R7989 Other specified abnormal findings of blood chemistry: Secondary | ICD-10-CM

## 2017-01-31 NOTE — Telephone Encounter (Signed)
Patient advised of lab results see labs for doucmentation

## 2017-01-31 NOTE — Telephone Encounter (Signed)
Pt called to return your call.  Call pt @ work [phone]. Thank you!

## 2017-02-01 ENCOUNTER — Other Ambulatory Visit: Payer: PPO

## 2017-02-01 DIAGNOSIS — D2271 Melanocytic nevi of right lower limb, including hip: Secondary | ICD-10-CM | POA: Diagnosis not present

## 2017-02-01 DIAGNOSIS — B079 Viral wart, unspecified: Secondary | ICD-10-CM | POA: Diagnosis not present

## 2017-02-01 DIAGNOSIS — D2272 Melanocytic nevi of left lower limb, including hip: Secondary | ICD-10-CM | POA: Diagnosis not present

## 2017-02-01 DIAGNOSIS — D2261 Melanocytic nevi of right upper limb, including shoulder: Secondary | ICD-10-CM | POA: Diagnosis not present

## 2017-02-01 DIAGNOSIS — D485 Neoplasm of uncertain behavior of skin: Secondary | ICD-10-CM | POA: Diagnosis not present

## 2017-02-01 DIAGNOSIS — B078 Other viral warts: Secondary | ICD-10-CM | POA: Diagnosis not present

## 2017-02-01 DIAGNOSIS — D225 Melanocytic nevi of trunk: Secondary | ICD-10-CM | POA: Diagnosis not present

## 2017-03-14 ENCOUNTER — Other Ambulatory Visit (INDEPENDENT_AMBULATORY_CARE_PROVIDER_SITE_OTHER): Payer: PPO

## 2017-03-14 ENCOUNTER — Encounter: Payer: Self-pay | Admitting: Gastroenterology

## 2017-03-14 ENCOUNTER — Ambulatory Visit (INDEPENDENT_AMBULATORY_CARE_PROVIDER_SITE_OTHER): Payer: PPO | Admitting: Gastroenterology

## 2017-03-14 VITALS — BP 174/67 | HR 86 | Temp 97.4°F | Ht 61.0 in | Wt 111.0 lb

## 2017-03-14 DIAGNOSIS — K838 Other specified diseases of biliary tract: Secondary | ICD-10-CM | POA: Diagnosis not present

## 2017-03-14 DIAGNOSIS — R945 Abnormal results of liver function studies: Secondary | ICD-10-CM

## 2017-03-14 DIAGNOSIS — R7989 Other specified abnormal findings of blood chemistry: Secondary | ICD-10-CM

## 2017-03-14 NOTE — Patient Instructions (Addendum)
You are scheduled for an MRCP with and without contrast at [facility] [place] location on Tuesday, Nov 6th at 10:15am. Please arrive at 9:45am. You cannot have anything to eat or drink 4 hours prior.   If you need to reschedule this appointment for any reason, please contact central scheduling at [phone].

## 2017-03-14 NOTE — Progress Notes (Signed)
Gastroenterology Consultation  Referring Provider:     Leone Haven, MD Primary Care Physician:  Leone Haven, MD Primary Gastroenterologist:  Dr. Allen Norris     Reason for Consultation:     Abnormal liver enzymes and a dilated bile duct        HPI:   Tamara Whitehead is a 71 y.o. y/o female referred for consultation & management of Abnormal liver enzymes and a dilated bile duct by Dr. [name], Angela Adam, MD.  This patient comes today with abnormal liver enzymes with the AST of 39 in the ALT of 40.  The patient states that she is followed by rheumatology and has had her liver enzymes checked regularly. The patient has had normal liver enzymes all the way back to May 2016 except for an isolated AST of 40 back in March 2017.  She reports that her liver enzymes were normal in June of this year but increased in July.  She states that the only indication she changed during that time was to add Crestor.  She denies any nausea vomiting fevers chills or abdominal pain. The patient had imaging 2 years ago that showed her bile duct to be 6 mm and now at her last ultrasound it showed it to have increased to 7.8 which was reported to be mildly dilated.  Past Medical History:  Diagnosis Date  . Arthritis    Osteoarthritis/Rheumatoid  . Cancer (HCC)    Skin  . Cataract   . Elevated blood pressure   . Fibrocystic breast disease   . Glaucoma   . Hypercholesterolemia   . Thyroid disease    Hypothyroid    Past Surgical History:  Procedure Laterality Date  . ABDOMINAL HYSTERECTOMY     Total abd. hysterectomu and bilateral salping-oophorectomy  . EYE SURGERY Right    Glaucoma surgery  . HEMORRHOID SURGERY    . status post dilatation and curettage  1975  . TONSILLECTOMY    . TUBAL LIGATION      Prior to Admission medications   Medication Sig Start Date End Date Taking? Authorizing Provider  Adalimumab 40 MG/0.8ML PNKT Inject 40 mg into the skin every 14 (fourteen) days. 10/24/16  Yes  [provider]  Calcium Carbonate (CALCIUM 600 PO) Take 600 mg by mouth daily.   Yes [provider]  Coenzyme Q-10 100 MG capsule Take 100 mg by mouth daily.   Yes [provider]  folic acid (FOLVITE) 932 MCG tablet Take by mouth.   Yes [provider]  hydroxychloroquine (PLAQUENIL) 200 MG tablet TK 2 TS PO ONCE DAILY 07/07/15  Yes [provider]  ibuprofen (ADVIL,MOTRIN) 200 MG tablet Take by mouth.   Yes [provider]  latanoprost (XALATAN) 0.005 % ophthalmic solution Apply to eye.   Yes [provider]  levothyroxine (SYNTHROID, LEVOTHROID) 75 MCG tablet Take 1 tablet (75 mcg total) by mouth daily. 12/27/16  Yes Leone Haven, MD  Multiple Vitamin tablet Take by mouth. 06/22/10  Yes [provider]  naproxen sodium (ALEVE) 220 MG tablet Take 220 mg by mouth 2 (two) times daily with a meal.   Yes [provider]  rosuvastatin (CRESTOR) 20 MG tablet Take 1 tablet (20 mg total) by mouth daily. 11/16/16  Yes Leone Haven, MD  vitamin C (ASCORBIC ACID) 500 MG tablet Take by mouth. 06/22/10  Yes [provider]  vitamin E 400 UNIT capsule Take by mouth. 06/22/10  Yes [provider]  Family History  Problem Relation Age of Onset  . CAD Mother   . Arthritis Mother        Rheumatoid Arthritis  . Arthritis Father        Rheumatoid/Osteoarthritis and Blindness, bleeding Tendencies,Glaucoma  . CAD Father   . Hypertension Maternal Grandmother   . COPD Neg Hx      Social History  Substance Use Topics  . Smoking status: Current Every Day Smoker    Packs/day: 0.25    Types: Cigarettes  . Smokeless tobacco: Never Used  . Alcohol use Yes     Comment: wine occasional    Allergies as of 03/14/2017 - Review Complete 03/14/2017  Allergen Reaction Noted  . Codeine Nausea And Vomiting 01/27/2015    Review of Systems:    All systems reviewed and negative except where noted in HPI.    Physical Exam:  BP (!) 174/67   Pulse 86   Temp (!) 97.4 F (36.3 C) (Oral)   Ht 5\' 1"  (1.549 m)   Wt 111 lb (50.3 kg)   BMI 20.97 kg/m  No LMP recorded. Patient has had a hysterectomy. Psych:  Alert and cooperative. Normal mood and affect. General:   Alert,  Well-developed, well-nourished, pleasant and cooperative in NAD Head:  Normocephalic and atraumatic. Eyes:  Sclera clear, no icterus.   Conjunctiva pink. Ears:  Normal auditory acuity. Nose:  No deformity, discharge, or lesions. Mouth:  No deformity or lesions,oropharynx pink & moist. Neck:  Supple; no masses or thyromegaly. Lungs:  Respirations even and unlabored.  Clear throughout to auscultation.   No wheezes, crackles, or rhonchi. No acute distress. Heart:  Regular rate and rhythm; no murmurs, clicks, rubs, or gallops. Abdomen:  Normal bowel sounds.  No bruits.  Soft, non-tender and non-distended without masses, hepatosplenomegaly or hernias noted.  No guarding or rebound tenderness.  Negative Carnett sign.   Rectal:  Deferred.  Msk:  Symmetrical without gross deformities.  Good, equal movement & strength bilaterally. Pulses:  Normal pulses noted. Extremities:  No clubbing or edema.  No cyanosis. Neurologic:  Alert and oriented x3;  grossly normal neurologically. Skin:  Intact without significant lesions or rashes.  No jaundice. Lymph Nodes:  No significant cervical adenopathy. Psych:  Alert and cooperative. Normal mood and affect.  Imaging Studies: No results found.  Assessment and Plan:   Tamara Whitehead is a 71 y.o. y/o female who has been found to have abnormal liver enzymes and a dilated bile duct.  Her bilirubin and outlet phosphatase are both normal. The patient's increased liver enzymes appear to have gone up with her starting Crestor and holding this medication should be considered to see if her liver enzymes return to normal.  The patient has also been recommended to have an MRCP as recommended by radiology to  better evaluate the bile duct for strictures or neoplasms.  The patient has been explained the plan and is concerned about the cost of the MRCP and we'll decide whether or not to undergo that test depending on its covered by her insurance.  The patient has been explained the plan and agrees with it.  Lucilla Lame, MD. Marval Regal   Note: This dictation was prepared with Dragon dictation along with smaller phrase technology. Any transcriptional errors that result from this process are unintentional.

## 2017-03-14 NOTE — Addendum Note (Signed)
Addended by: Arby Barrette on: 03/14/2017 01:17 PM   Modules accepted: Orders

## 2017-03-15 ENCOUNTER — Other Ambulatory Visit: Payer: PPO

## 2017-03-15 LAB — IRON,TIBC AND FERRITIN PANEL
%SAT: 29 % (ref 11–50)
FERRITIN: 63 ng/mL (ref 20–288)
IRON: 117 ug/dL (ref 45–160)
TIBC: 401 ug/dL (ref 250–450)

## 2017-03-15 LAB — HEPATITIS C ANTIBODY
HEP C AB: NONREACTIVE
SIGNAL TO CUT-OFF: 0.26 (ref <1.00)

## 2017-03-15 LAB — HEPATITIS B SURFACE ANTIGEN: Hepatitis B Surface Ag: NONREACTIVE

## 2017-03-15 LAB — HEPATITIS B SURFACE ANTIBODY,QUALITATIVE: HEP B S AB: NONREACTIVE

## 2017-03-15 LAB — HEPATITIS B CORE ANTIBODY, TOTAL: HEP B C TOTAL AB: NONREACTIVE

## 2017-03-15 LAB — HEPATITIS A ANTIBODY, TOTAL: Hepatitis A AB,Total: REACTIVE — AB

## 2017-03-28 ENCOUNTER — Ambulatory Visit: Payer: PPO

## 2017-03-28 ENCOUNTER — Other Ambulatory Visit: Payer: Self-pay | Admitting: Gastroenterology

## 2017-03-28 DIAGNOSIS — R945 Abnormal results of liver function studies: Secondary | ICD-10-CM

## 2017-03-28 DIAGNOSIS — R7989 Other specified abnormal findings of blood chemistry: Secondary | ICD-10-CM

## 2017-03-29 ENCOUNTER — Ambulatory Visit
Admission: RE | Admit: 2017-03-29 | Discharge: 2017-03-29 | Disposition: A | Discharge status: Home / Self Care | Payer: PPO | Source: Ambulatory Visit | Attending: Gastroenterology | Admitting: Gastroenterology

## 2017-03-29 DIAGNOSIS — R932 Abnormal findings on diagnostic imaging of liver and biliary tract: Secondary | ICD-10-CM | POA: Diagnosis not present

## 2017-03-29 DIAGNOSIS — R7989 Other specified abnormal findings of blood chemistry: Secondary | ICD-10-CM

## 2017-03-29 DIAGNOSIS — R945 Abnormal results of liver function studies: Secondary | ICD-10-CM | POA: Diagnosis not present

## 2017-03-29 LAB — POCT I-STAT CREATININE: Creatinine, Ser: 0.9 mg/dL (ref 0.44–1.00)

## 2017-03-29 MED ORDER — GADOBENATE DIMEGLUMINE 529 MG/ML IV SOLN
10.0000 mL | Freq: Once | INTRAVENOUS | Status: AC | PRN
  Administered 2017-03-29: 10 mL via INTRAVENOUS

## 2017-03-31 ENCOUNTER — Telehealth: Payer: Self-pay

## 2017-03-31 NOTE — Telephone Encounter (Signed)
Pt notified of MRCP results. Pt is having labs drawn on 04/10/17. Will have those forwarded to [name] once completed.

## 2017-03-31 NOTE — Telephone Encounter (Signed)
-----   Message from Lucilla Lame, MD sent at 03/30/2017 10:41 AM EST ----- Let the patient know that her bile duct and pancreatic duct and the entire MRI was normal. She should have her liver enzymes checked again to see if they have come back to normal.

## 2017-04-10 DIAGNOSIS — M0579 Rheumatoid arthritis with rheumatoid factor of multiple sites without organ or systems involvement: Secondary | ICD-10-CM | POA: Diagnosis not present

## 2017-04-10 DIAGNOSIS — R6889 Other general symptoms and signs: Secondary | ICD-10-CM | POA: Diagnosis not present

## 2017-04-22 DIAGNOSIS — R05 Cough: Secondary | ICD-10-CM | POA: Diagnosis not present

## 2017-04-22 DIAGNOSIS — R0602 Shortness of breath: Secondary | ICD-10-CM | POA: Diagnosis not present

## 2017-04-22 DIAGNOSIS — J40 Bronchitis, not specified as acute or chronic: Secondary | ICD-10-CM | POA: Diagnosis not present

## 2017-07-03 DIAGNOSIS — H401131 Primary open-angle glaucoma, bilateral, mild stage: Secondary | ICD-10-CM | POA: Diagnosis not present

## 2017-07-10 DIAGNOSIS — Z79899 Other long term (current) drug therapy: Secondary | ICD-10-CM | POA: Diagnosis not present

## 2017-07-10 DIAGNOSIS — M069 Rheumatoid arthritis, unspecified: Secondary | ICD-10-CM | POA: Diagnosis not present

## 2017-08-21 ENCOUNTER — Other Ambulatory Visit: Payer: Self-pay | Admitting: Family Medicine

## 2017-08-21 DIAGNOSIS — E039 Hypothyroidism, unspecified: Secondary | ICD-10-CM

## 2018-01-07 ENCOUNTER — Other Ambulatory Visit: Payer: Self-pay | Admitting: Family Medicine

## 2018-01-07 DIAGNOSIS — E039 Hypothyroidism, unspecified: Secondary | ICD-10-CM

## 2018-01-08 NOTE — Telephone Encounter (Signed)
Patient has not been seen in over a year.  She needs to have an appointment scheduled and a thyroid function test completed before I can refill.  Once she is scheduled and her TSH is drawn I can refill that medicine.  Thanks.

## 2018-01-08 NOTE — Telephone Encounter (Signed)
Last OV 11/07/16 last filled 08/23/17 90 0rf

## 2018-01-09 NOTE — Telephone Encounter (Signed)
Patient states she can complete it tomorrow during her AWV but she has not been on it for a couple of weeks due to being out of the medication. Please place order

## 2018-01-09 NOTE — Telephone Encounter (Signed)
Noted.  TSH ordered.  She needs to be scheduled for a follow-up visit in the office with me.  Thanks.

## 2018-01-10 ENCOUNTER — Ambulatory Visit (INDEPENDENT_AMBULATORY_CARE_PROVIDER_SITE_OTHER): Payer: PPO

## 2018-01-10 VITALS — BP 142/82 | HR 63 | Temp 98.8°F | Resp 15 | Ht 61.0 in | Wt 108.0 lb

## 2018-01-10 DIAGNOSIS — Z0000 Encounter for general adult medical examination without abnormal findings: Secondary | ICD-10-CM

## 2018-01-10 NOTE — Progress Notes (Signed)
Subjective:   Tamara Whitehead is a 72 y.o. female who presents for an Initial Medicare Annual Wellness Visit.  Review of Systems    No ROS.  Medicare Wellness Visit. Additional risk factors are reflected in the social history.  Cardiac Risk Factors include: advanced age (>46men, >42 women)     Objective:    Today's Vitals   01/10/18 0921  BP: (!) 142/82  Pulse: 63  Resp: 15  Temp: 98.8 F (37.1 C)  TempSrc: Oral  SpO2: 95%  Weight: 108 lb (49 kg)  Height: 5\' 1"  (1.549 m)   Body mass index is 20.41 kg/m.  Advanced Directives 01/10/2018  Does Patient Have a Medical Advance Directive? Yes  Type of Paramedic of [place];Living will  Does patient want to make changes to medical advance directive? No - Patient declined  Copy of [place] in Chart? No - copy requested    Current Medications (verified) Outpatient Encounter Medications as of 01/10/2018  Medication Sig  . Adalimumab 40 MG/0.8ML PNKT Inject 40 mg into the skin every 14 (fourteen) days.  . Calcium Carbonate (CALCIUM 600 PO) Take 600 mg by mouth daily.  . Coenzyme Q-10 100 MG capsule Take 100 mg by mouth daily.  . hydroxychloroquine (PLAQUENIL) 200 MG tablet TK 2 TS PO ONCE DAILY  . latanoprost (XALATAN) 0.005 % ophthalmic solution Apply to eye.  . levothyroxine (SYNTHROID, LEVOTHROID) 75 MCG tablet TAKE 1 TABLET(75 MCG) BY MOUTH DAILY  . Multiple Vitamin tablet Take by mouth.  . naproxen sodium (ALEVE) 220 MG tablet Take 220 mg by mouth 2 (two) times daily with a meal.  . rosuvastatin (CRESTOR) 20 MG tablet Take 1 tablet (20 mg total) by mouth daily.  . Vitamins C E (VITAMIN C/VITAMIN E PO) Take 1 capsule by mouth daily.  . [DISCONTINUED] folic acid (FOLVITE) 322 MCG tablet Take by mouth.  . [DISCONTINUED] vitamin C (ASCORBIC ACID) 500 MG tablet Take by mouth.  . [DISCONTINUED] vitamin E 400 UNIT capsule Take by mouth.  Marland Kitchen ibuprofen (ADVIL,MOTRIN) 200 MG tablet Take by  mouth.   No facility-administered encounter medications on file as of 01/10/2018.     Allergies (verified) Codeine   History: Past Medical History:  Diagnosis Date  . Arthritis    Osteoarthritis/Rheumatoid  . Cancer (HCC)    Skin  . Cataract   . Elevated blood pressure   . Fibrocystic breast disease   . Glaucoma   . Hypercholesterolemia   . Thyroid disease    Hypothyroid   Past Surgical History:  Procedure Laterality Date  . ABDOMINAL HYSTERECTOMY     Total abd. hysterectomu and bilateral salping-oophorectomy  . EYE SURGERY Right    Glaucoma surgery  . HEMORRHOID SURGERY    . status post dilatation and curettage  1975  . TONSILLECTOMY    . TUBAL LIGATION     Family History  Problem Relation Age of Onset  . CAD Mother   . Arthritis Mother        Rheumatoid Arthritis  . Arthritis Father        Rheumatoid/Osteoarthritis and Blindness, bleeding Tendencies,Glaucoma  . CAD Father   . Hypertension Maternal Grandmother   . COPD Neg Hx    Social History   Socioeconomic History  . Marital status: Single    Spouse name: Not on file  . Number of children: Not on file  . Years of education: Not on file  . Highest education level: Not on  file  Occupational History  . Not on file  Social Needs  . Financial resource strain: Not very hard  . Food insecurity:    Worry: Never true    Inability: Never true  . Transportation needs:    Medical: No    Non-medical: No  Tobacco Use  . Smoking status: Current Every Day Smoker    Packs/day: 0.25    Types: Cigarettes  . Smokeless tobacco: Never Used  Substance and Sexual Activity  . Alcohol use: Yes    Comment: wine occasional  . Drug use: No  . Sexual activity: Not on file  Lifestyle  . Physical activity:    Days per week: Not on file    Minutes per session: Not on file  . Stress: Not on file  Relationships  . Social connections:    Talks on phone: Not on file    Gets together: Not on file    Attends religious  service: Not on file    Active member of club or organization: Not on file    Attends meetings of clubs or organizations: Not on file    Relationship status: Not on file  Other Topics Concern  . Not on file  Social History Narrative  . Not on file    Tobacco Counseling Ready to quit: Not Answered Counseling given: Not Answered   Clinical Intake:  Pre-visit preparation completed: Yes  Pain : No/denies pain     Nutritional Status: BMI of 19-24  Normal Diabetes: No  How often do you need to have someone help you when you read instructions, pamphlets, or other written materials from your doctor or pharmacy?: 1 - Never  Interpreter Needed?: No      Activities of Daily Living In your present state of health, do you have any difficulty performing the following activities: 01/10/2018  Hearing? N  Vision? N  Difficulty concentrating or making decisions? N  Walking or climbing stairs? N  Dressing or bathing? N  Doing errands, shopping? N  Preparing Food and eating ? N  Using the Toilet? N  In the past six months, have you accidently leaked urine? Y  Comment Managed with a daily liner  Do you have problems with loss of bowel control? N  Managing your Medications? N  Managing your Finances? N  Housekeeping or managing your Housekeeping? N  Some recent data might be hidden     Immunizations and Health Maintenance Immunization History  Administered Date(s) Administered  . H1N1 04/07/2008  . Influenza Split 05/28/2010  . PPD Test 05/11/2015  . Pneumococcal Conjugate-13 09/29/2014  . Pneumococcal Polysaccharide-23 10/07/2011  . Pneumococcal-Unspecified 10/07/2011   Health Maintenance Due  Topic Date Due  . TETANUS/TDAP  01/25/1964  . MAMMOGRAM  01/25/1995  . COLONOSCOPY  01/25/1995  . DEXA SCAN  01/24/2010  . INFLUENZA VACCINE  12/21/2017    Patient Care Team: Leone Haven, MD as PCP - General (Family Medicine)  Indicate any recent Medical Services you  may have received from other than [facility] providers in the past year (date may be approximate).     Assessment:   This is a routine wellness examination for [name].  The goal of the wellness visit is to assist the patient how to close the gaps in care and create a preventative care plan for the patient.   The roster of all physicians providing medical care to patient is listed in the Snapshot section of the chart.  Osteoarthritis. Osteoporosis risk reviewed.  Dexa  scan deferred per patient request.   Safety issues reviewed; Smoke and carbon monoxide detectors in the home. No firearms in the home. Wears seatbelts when driving or riding with others. No violence in the home.  They do not have excessive sun exposure.  Discussed the need for sun protection: hats, long sleeves and the use of sunscreen if there is significant sun exposure.  Patient is alert, normal appearance, oriented to person/place/and time.  Correctly identified the president of the [place] and recalls of 3/3 words. Performs simple calculations and can read correct time from watch face.  Displays appropriate judgement.  No new identified risk were noted.  No failures at ADL's or IADL's.    BMI- discussed the importance of a healthy diet, water intake and the benefits of aerobic exercise. Educational material provided.   24 hour diet recall: 3 meals daily  Sleep patterns- Sleeps 5-6 hours at night.  Takes OTC aleve pm.   TDAP vaccine deferred per patient preference.  Follow up with insurance.  Educational material provided.  Mammogram deferred per patient request.   Colonoscopy/Cologuard discussed. Educational material provided.  Influenza discussed.   All health maintenance deferred at this time due to taking care of her adult son.  She has concerns about his health and wants to be completely available for him right now.  She plans to follow up with her doctor to close gaps in care as things resolve.   Hearing/Vision  screen Hearing Screening Comments: Patient is able to hear conversational tones without difficulty.  No issues reported.   Vision Screening Comments: Followed by [facility] Wears reading glasses Last OV 06/2017 Cataract extraction, L eye only Glaucoma; drops in use Visual acuity not assessed per patient preference since they have regular follow up with the ophthalmologist  Dietary issues and exercise activities discussed: Current Exercise Habits: Home exercise routine, Type of exercise: walking, Time (Minutes): 30, Frequency (Times/Week): 5, Weekly Exercise (Minutes/Week): 150, Intensity: Mild  Goals    . Maintain weight      Ensure Enlive supplement drink daily with healthy diet      Depression Screen PHQ 2/9 Scores 01/10/2018 11/07/2016  PHQ - 2 Score 0 0  PHQ- 9 Score - 2    Fall Risk Fall Risk  01/10/2018  Falls in the past year? No   Cognitive Function: MMSE - Mini Mental State Exam 01/10/2018  Orientation to time 5  Orientation to Place 5  Registration 3  Attention/ Calculation 5  Recall 3  Language- name 2 objects 2  Language- repeat 1  Language- follow 3 step command 3  Language- read & follow direction 1  Write a sentence 1  Copy design 1  Total score 30        Screening Tests Health Maintenance  Topic Date Due  . TETANUS/TDAP  01/25/1964  . MAMMOGRAM  01/25/1995  . COLONOSCOPY  01/25/1995  . DEXA SCAN  01/24/2010  . INFLUENZA VACCINE  12/21/2017  . Hepatitis C Screening  Completed  . PNA vac Low Risk Adult  Completed     Plan:    End of life planning; Advance aging; Advanced directives discussed. Copy of current HCPOA/Living Will requested upon completion.    I have personally reviewed and noted the following in the patient's chart:   . Medical and social history . Use of alcohol, tobacco or illicit drugs  . Current medications and supplements . Functional ability and status . Nutritional status . Physical activity . Advanced  directives .  List of other physicians . Hospitalizations, surgeries, and ER visits in previous 12 months . Vitals . Screenings to include cognitive, depression, and falls . Referrals and appointments  In addition, I have reviewed and discussed with patient certain preventive protocols, quality metrics, and best practice recommendations. A written personalized care plan for preventive services as well as general preventive health recommendations were provided to patient.     Varney Biles, LPN   [id number]

## 2018-01-10 NOTE — Patient Instructions (Addendum)
Tamara Whitehead , Thank you for taking time to come for your Medicare Wellness Visit. I appreciate your ongoing commitment to your health goals. Please review the following plan we discussed and let me know if I can assist you in the future.   Please try to stop smoking.  Consider mammogram, cologuard, influenza vaccine, tdap vaccine, and dexa scan within the year.   These are the goals we discussed: Goals    . Maintain weight      Ensure Enlive supplement drink daily with healthy diet       This is a list of the screening recommended for you and due dates:  Health Maintenance  Topic Date Due  . Tetanus Vaccine  01/25/1964  . Mammogram  01/25/1995  . Colon Cancer Screening  01/25/1995  . DEXA scan (bone density measurement)  01/24/2010  . Flu Shot  12/21/2017  .  Hepatitis C: One time screening is recommended by Center for Disease Control  (CDC) for  adults born from [birth year] through 1966.   Completed  . Pneumonia vaccines  Completed   Bone Densitometry Bone densitometry is an imaging test that uses a special X-ray to measure the amount of calcium and other minerals in your bones (bone density). This test is also known as a bone mineral density test or dual-energy X-ray absorptiometry (DXA). The test can measure bone density at your hip and your spine. It is similar to having a regular X-ray. You may have this test to:  Diagnose a condition that causes weak or thin bones (osteoporosis).  Predict your risk of a broken bone (fracture).  Determine how well osteoporosis treatment is working.  Tell a health care provider about:  Any allergies you have.  All medicines you are taking, including vitamins, herbs, eye drops, creams, and over-the-counter medicines.  Any problems you or family members have had with anesthetic medicines.  Any blood disorders you have.  Any surgeries you have had.  Any medical conditions you have.  Possibility of pregnancy.  Any other medical test you  had within the previous 14 days that used contrast material. What are the risks? Generally, this is a safe procedure. However, problems can occur and may include the following:  This test exposes you to a very small amount of radiation.  The risks of radiation exposure may be greater to unborn children.  What happens before the procedure?  Do not take any calcium supplements for 24 hours before having the test. You can otherwise eat and drink what you usually do.  Take off all metal jewelry, eyeglasses, dental appliances, and any other metal objects. What happens during the procedure?  You may lie on an exam table. There will be an X-ray generator below you and an imaging device above you.  Other devices, such as boxes or braces, may be used to position your body properly for the scan.  You will need to lie still while the machine slowly scans your body.  The images will show up on a computer monitor. What happens after the procedure? You may need more testing at a later time. This information is not intended to replace advice given to you by your health care provider. Make sure you discuss any questions you have with your health care provider. Document Released: 05/31/2004 Document Revised: 10/15/2015 Document Reviewed: 10/17/2013 Elsevier Interactive Patient Education  2018 [place] A mammogram is an X-ray of the breasts that is done to check for abnormal changes. This procedure  can screen for and detect any changes that may suggest breast cancer. A mammogram can also identify other changes and variations in the breast, such as:  Inflammation of the breast tissue (mastitis).  An infected area that contains a collection of pus (abscess).  A fluid-filled sac (cyst).  Fibrocystic changes. This is when breast tissue becomes denser, which can make the tissue feel rope-like or uneven under the skin.  Tumors that are not cancerous (benign).  Tell a health care  provider about:  Any allergies you have.  If you have breast implants.  If you have had previous breast disease, biopsy, or surgery.  If you are breastfeeding.  Any possibility that you could be pregnant, if this applies.  If you are younger than age 28.  If you have a family history of breast cancer. What are the risks? Generally, this is a safe procedure. However, problems may occur, including:  Exposure to radiation. Radiation levels are very low with this test.  The results being misinterpreted.  The need for further tests.  The inability of the mammogram to detect certain cancers.  What happens before the procedure?  Schedule your test about 1-2 weeks after your menstrual period. This is usually when your breasts are the least tender.  If you have had a mammogram done at a different facility in the past, get the mammogram X-rays or have them sent to your current exam facility in order to compare them.  Wash your breasts and under your arms the day of the test.  Do not wear deodorants, perfumes, lotions, or powders anywhere on your body on the day of the test.  Remove any jewelry from your neck.  Wear clothes that you can change into and out of easily. What happens during the procedure?  You will undress from the waist up and put on a gown.  You will stand in front of the X-ray machine.  Each breast will be placed between two plastic or glass plates. The plates will compress your breast for a few seconds. Try to stay as relaxed as possible during the procedure. This does not cause any harm to your breasts and any discomfort you feel will be very brief.  X-rays will be taken from different angles of each breast. The procedure may vary among health care providers and hospitals. What happens after the procedure?  The mammogram will be examined by a specialist (radiologist).  You may need to repeat certain parts of the test, depending on the quality of the images.  This is commonly done if the radiologist needs a better view of the breast tissue.  Ask when your test results will be ready. Make sure you get your test results.  You may resume your normal activities. This information is not intended to replace advice given to you by your health care provider. Make sure you discuss any questions you have with your health care provider. Document Released: 05/06/2000 Document Revised: 10/12/2015 Document Reviewed: 07/18/2014 Elsevier Interactive Patient Education  [employer].

## 2018-01-12 NOTE — Progress Notes (Signed)
I have reviewed the above note and agree.  Finnlee Guarnieri, M.D.  

## 2018-05-02 ENCOUNTER — Ambulatory Visit: Payer: PPO | Admitting: Family Medicine

## 2018-05-02 ENCOUNTER — Telehealth: Payer: Self-pay | Admitting: Family Medicine

## 2018-05-02 ENCOUNTER — Encounter

## 2018-05-02 DIAGNOSIS — M0579 Rheumatoid arthritis with rheumatoid factor of multiple sites without organ or systems involvement: Secondary | ICD-10-CM | POA: Diagnosis not present

## 2018-05-02 DIAGNOSIS — R030 Elevated blood-pressure reading, without diagnosis of hypertension: Secondary | ICD-10-CM | POA: Diagnosis not present

## 2018-06-09 IMAGING — MR MR ABDOMEN WO/W CM MRCP
18 of 20 series · 43 of 48 positions shown · IV contrast (10ML MULTIHANCE)
Comparison: Ultrasound 2185

CLINICAL DATA: Abnormal results of liver function tests.

EXAM:
MRI ABDOMEN WITHOUT AND WITH CONTRAST (INCLUDING MRCP)
TECHNIQUE: Multiplanar multisequence MR imaging of the abdomen was performed
both before and after the administration of intravenous contrast.
Heavily T2-weighted images of the biliary and pancreatic ducts were
obtained, and three-dimensional MRCP images were rendered by post
processing.
CONTRAST:  10mL MULTIHANCE GADOBENATE DIMEGLUMINE 529 MG/ML IV SOLN

[Series 2: bSSFP · coronal · 6.0mm · 0.74mm/px · 1 of 27 slices shown]
[im 1/27]
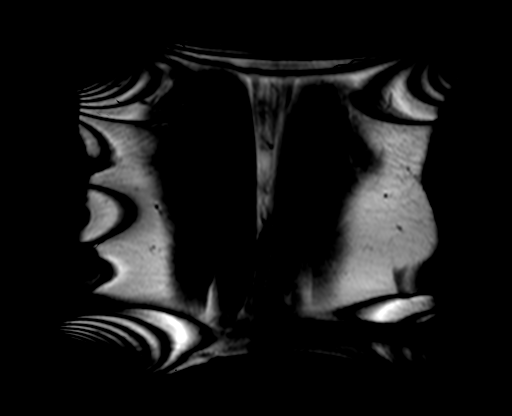

[Series 3: T2 fat-sat · axial · 6.0mm · 1.48mm/px · 1 of 30 slices shown]
[im 1/30]
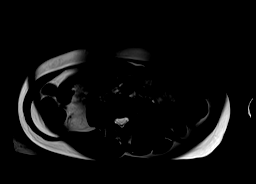

[Series 4: axial ssfse / · axial · 6.0mm · 1.19mm/px · 1 of 30 slices shown]
[im 1/30]
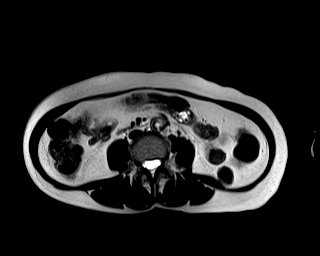

[Series 5: MRCP · coronal · 50.0mm · 0.78mm/px · 1 of 3 slices shown]
[im 1/3]
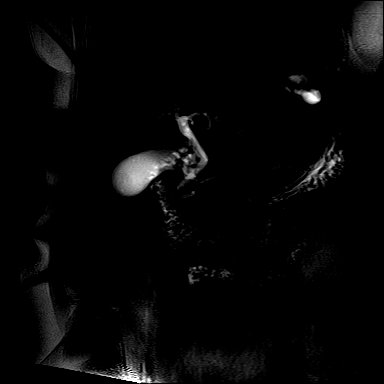

[Series 6: T1 · axial · 6.0mm · 0.78mm/px · z∈[-56,+153]mm · 2 of 60 slices shown]
[im 1/60]
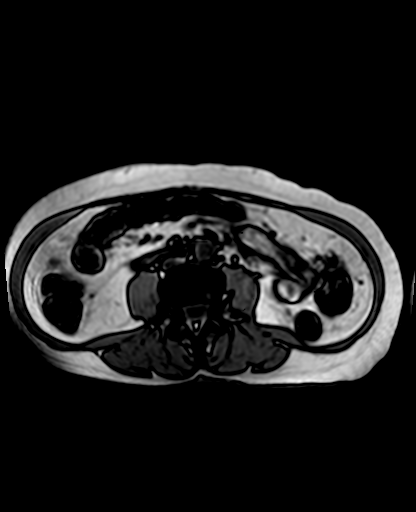
[im 60/60]
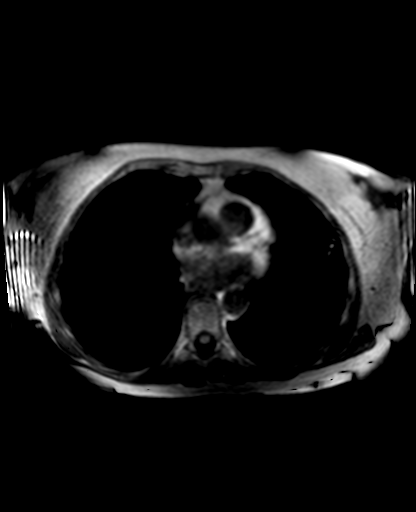

[Series 7: axial dynamic pre · axial · non-contrast · 4.0mm · 1.19mm/px · z∈[-78,+174]mm · 3 of 64 slices shown]
[im 1/64]
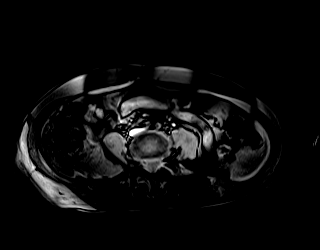
[im 32/64]
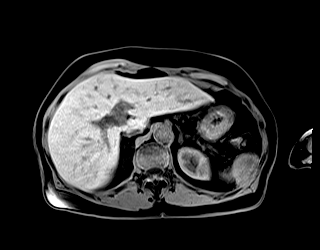
[im 64/64]
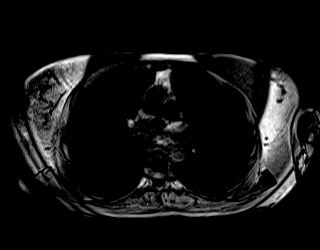

[Series 8: cor thins · coronal · 3.5mm · 0.94mm/px · 1 of 11 slices shown]
[im 1/11]
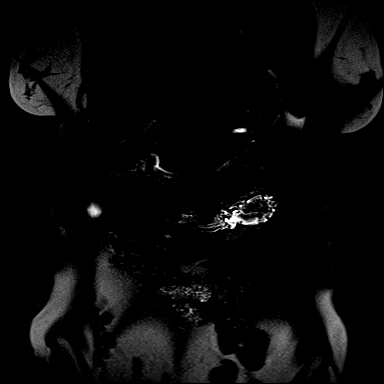

[Series 9: DWI · axial · 6.0mm · 2.00mm/px · z∈[-67,+163]mm · 5 of 99 slices shown]
[im 1/99]
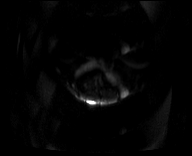
[im 25/99]
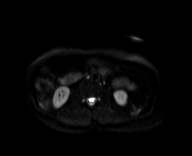
[im 50/99]
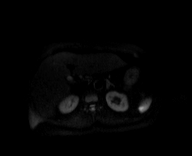
[im 74/99]
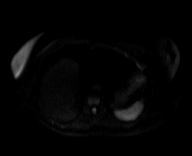
[im 99/99]
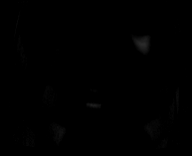

[Series 10: ax dwi_adc · axial · 6.0mm · 2.00mm/px · z∈[-67,+163]mm · 2 of 33 slices shown]
[im 1/33]
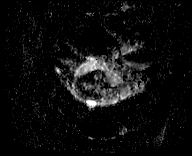
[im 33/33]
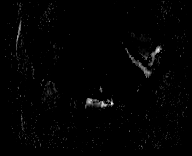

[Series 12: T2 · coronal · 1.5mm · 0.99mm/px · 2 of 40 slices shown]
[im 1/40]
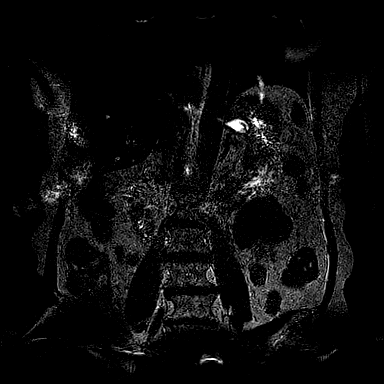
[im 40/40]
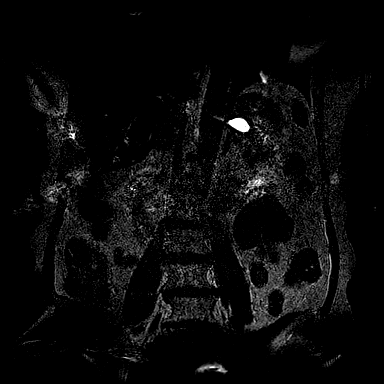

[Series 14: axial dynamic post · axial · 4.0mm · 1.19mm/px · z∈[-78,+174]mm · 3 of 64 slices shown (1 of 6)]
[im 1/64]
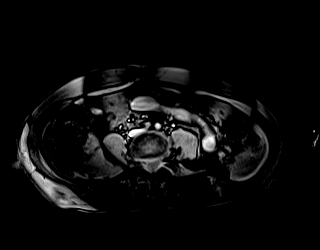
[im 32/64]
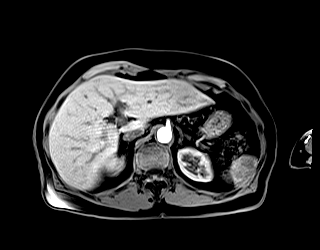
[im 64/64]
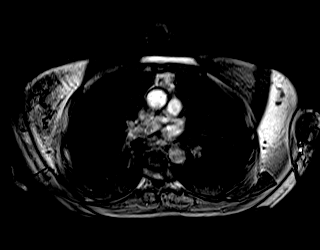

[Series 15: axial dynamic post · axial · 4.0mm · 1.19mm/px · z∈[-78,+174]mm · 3 of 64 slices shown (2 of 6)]
[im 1/64]
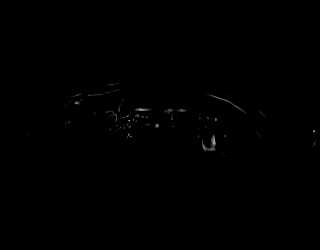
[im 32/64]
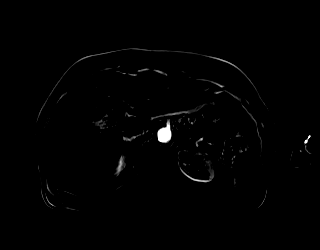
[im 64/64]
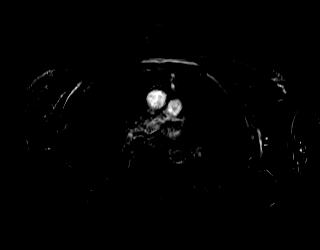

[Series 16: axial dynamic post · axial · 4.0mm · 1.19mm/px · z∈[-78,+174]mm · 3 of 64 slices shown (3 of 6)]
[im 1/64]
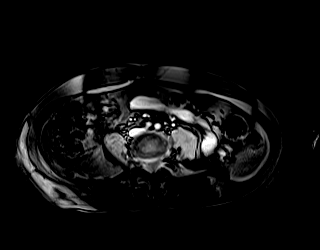
[im 32/64]
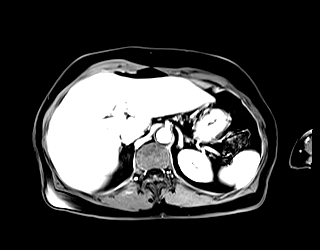
[im 64/64]
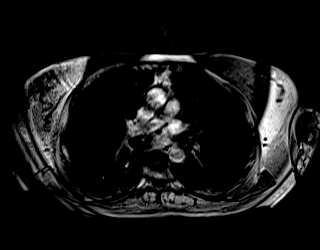

[Series 17: axial dynamic post · axial · 4.0mm · 1.19mm/px · z∈[-78,+174]mm · 3 of 64 slices shown (4 of 6)]
[im 1/64]
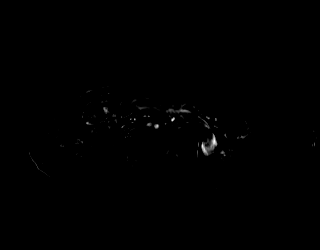
[im 32/64]
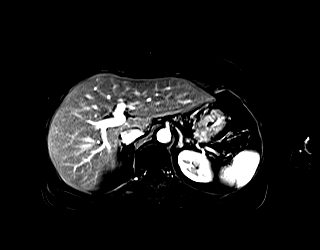
[im 64/64]
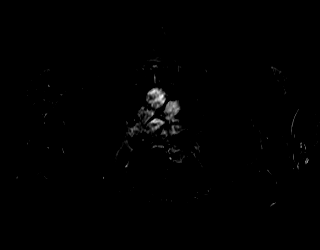

[Series 18: axial dynamic post · axial · 4.0mm · 1.19mm/px · z∈[-78,+174]mm · 3 of 64 slices shown (5 of 6)]
[im 1/64]
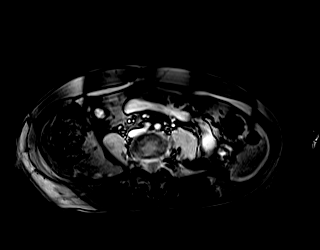
[im 32/64]
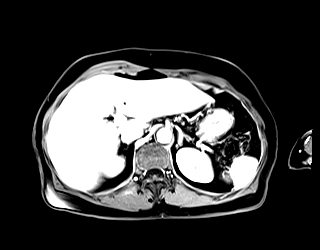
[im 64/64]
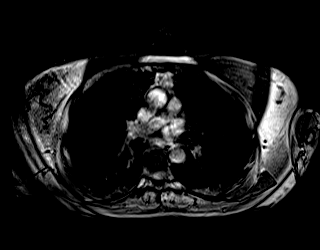

[Series 19: axial dynamic post · axial · 4.0mm · 1.19mm/px · z∈[-78,+174]mm · 3 of 64 slices shown (6 of 6)]
[im 1/64]
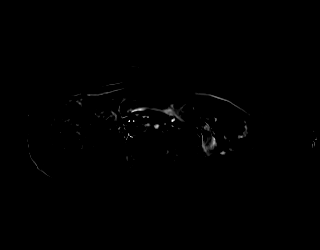
[im 32/64]
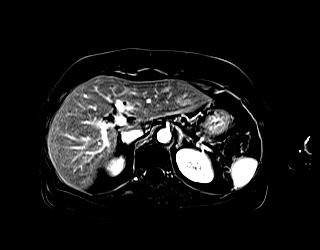
[im 64/64]
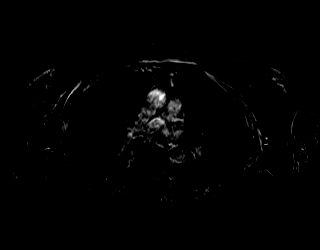

[Series 21: axial dynamic delayed · axial · 4.0mm · 1.19mm/px · z∈[-78,+174]mm · 3 of 64 slices shown]
[im 1/64]
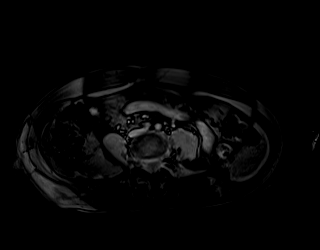
[im 32/64]
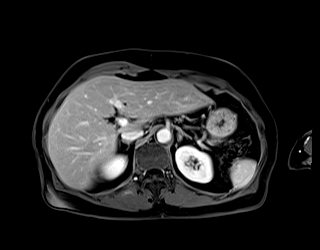
[im 64/64]
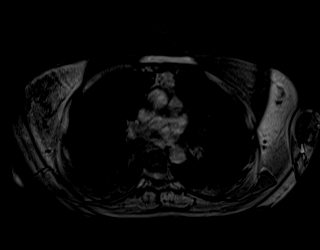

[Series 22: axial dynamic delayed_sub · axial · 4.0mm · 1.19mm/px · z∈[-78,+174]mm · 3 of 64 slices shown]
[im 1/64]
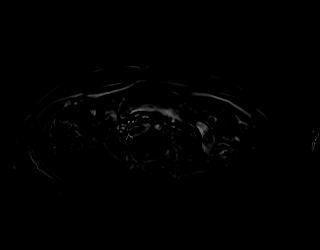
[im 32/64]
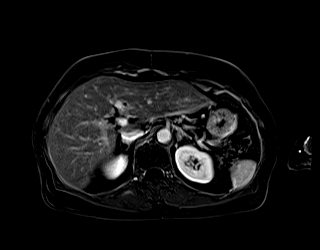
[im 64/64]
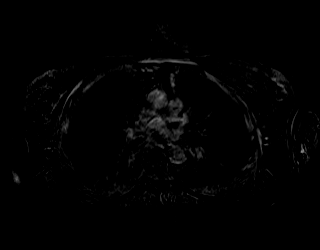

[43 of 48 positions shown; findings below may reference images not displayed]

FINDINGS: Lower chest:  Lung bases are clear.

Hepatobiliary: Normal hepatic parenchymal intensity. No biliary duct
dilatation. No focal hepatic lesion. Gallbladder normal. No hepatic
steatosis evident by opposed phase imaging.

The common bile duct is normal caliber. No filling defect. No
intrahepatic duct dilatation.

Pancreas: Normal pancreatic parenchymal intensity. No ductal
dilatation or inflammation.

Spleen: Normal spleen.

Adrenals/urinary tract: Adrenal glands and kidneys are normal.

Stomach/Bowel: Stomach and limited of the small bowel is
unremarkable

Vascular/Lymphatic: Abdominal aortic normal caliber. No
retroperitoneal periportal lymphadenopathy.

Musculoskeletal: No aggressive osseous lesion
IMPRESSION: 1. Normal liver by MR imaging.
2. Normal biliary tree and gallbladder.
3. Normal pancreas.

## 2018-11-15 DIAGNOSIS — M0579 Rheumatoid arthritis with rheumatoid factor of multiple sites without organ or systems involvement: Secondary | ICD-10-CM | POA: Diagnosis not present

## 2018-11-15 DIAGNOSIS — M1812 Unilateral primary osteoarthritis of first carpometacarpal joint, left hand: Secondary | ICD-10-CM | POA: Diagnosis not present

## 2018-11-15 DIAGNOSIS — Z862 Personal history of diseases of the blood and blood-forming organs and certain disorders involving the immune mechanism: Secondary | ICD-10-CM | POA: Diagnosis not present

## 2019-01-15 ENCOUNTER — Other Ambulatory Visit: Payer: Self-pay

## 2019-01-15 ENCOUNTER — Encounter: Payer: Self-pay | Admitting: Family Medicine

## 2019-01-15 ENCOUNTER — Ambulatory Visit (INDEPENDENT_AMBULATORY_CARE_PROVIDER_SITE_OTHER): Payer: Medicare HMO | Admitting: Family Medicine

## 2019-01-15 ENCOUNTER — Ambulatory Visit (INDEPENDENT_AMBULATORY_CARE_PROVIDER_SITE_OTHER): Payer: Medicare HMO

## 2019-01-15 VITALS — BP 110/70 | HR 61 | Temp 97.9°F | Ht 61.0 in | Wt 109.2 lb

## 2019-01-15 DIAGNOSIS — M069 Rheumatoid arthritis, unspecified: Secondary | ICD-10-CM | POA: Diagnosis not present

## 2019-01-15 DIAGNOSIS — D696 Thrombocytopenia, unspecified: Secondary | ICD-10-CM | POA: Diagnosis not present

## 2019-01-15 DIAGNOSIS — Z1211 Encounter for screening for malignant neoplasm of colon: Secondary | ICD-10-CM

## 2019-01-15 DIAGNOSIS — Z720 Tobacco use: Secondary | ICD-10-CM

## 2019-01-15 DIAGNOSIS — E7800 Pure hypercholesterolemia, unspecified: Secondary | ICD-10-CM | POA: Diagnosis not present

## 2019-01-15 DIAGNOSIS — E039 Hypothyroidism, unspecified: Secondary | ICD-10-CM | POA: Diagnosis not present

## 2019-01-15 DIAGNOSIS — Z0000 Encounter for general adult medical examination without abnormal findings: Secondary | ICD-10-CM | POA: Diagnosis not present

## 2019-01-15 LAB — COMPREHENSIVE METABOLIC PANEL
ALT: 29 U/L (ref 0–35)
AST: 31 U/L (ref 0–37)
Albumin: 4.8 g/dL (ref 3.5–5.2)
Alkaline Phosphatase: 80 U/L (ref 39–117)
BUN: 9 mg/dL (ref 6–23)
CO2: 27 mEq/L (ref 19–32)
Calcium: 9.4 mg/dL (ref 8.4–10.5)
Chloride: 103 mEq/L (ref 96–112)
Creatinine, Ser: 0.87 mg/dL (ref 0.40–1.20)
GFR: 63.65 mL/min (ref >60.00)
Glucose, Bld: 108 mg/dL — ABNORMAL HIGH (ref 70–99)
Potassium: 4.2 mEq/L (ref 3.5–5.1)
Sodium: 138 mEq/L (ref 135–145)
Total Bilirubin: 0.7 mg/dL (ref 0.2–1.2)
Total Protein: 7.2 g/dL (ref 6.0–8.3)

## 2019-01-15 LAB — TSH: TSH: 8.58 u[IU]/mL — ABNORMAL HIGH (ref 0.35–4.50)

## 2019-01-15 LAB — CBC
HCT: 47.2 % — ABNORMAL HIGH (ref 36.0–46.0)
Hemoglobin: 16 g/dL — ABNORMAL HIGH (ref 12.0–15.0)
MCHC: 33.8 g/dL (ref 30.0–36.0)
MCV: 97.8 fl (ref 78.0–100.0)
Platelets: 204 10*3/uL (ref 150.0–400.0)
RBC: 4.82 Mil/uL (ref 3.87–5.11)
RDW: 13.2 % (ref 11.5–15.5)
WBC: 9.5 10*3/uL (ref 4.0–10.5)

## 2019-01-15 LAB — LIPID PANEL
Cholesterol: 288 mg/dL — ABNORMAL HIGH (ref 0–200)
HDL: 39.3 mg/dL (ref >39.00)
NonHDL: 248.9
Total CHOL/HDL Ratio: 7
Triglycerides: 233 mg/dL — ABNORMAL HIGH (ref 0.0–149.0)
VLDL: 46.6 mg/dL — ABNORMAL HIGH (ref 0.0–40.0)

## 2019-01-15 LAB — LDL CHOLESTEROL, DIRECT: Direct LDL: 195 mg/dL

## 2019-01-15 NOTE — Assessment & Plan Note (Signed)
Check lipid panel  

## 2019-01-15 NOTE — Assessment & Plan Note (Signed)
Stable.  She will continue to see rheumatology.

## 2019-01-15 NOTE — Assessment & Plan Note (Signed)
Check TSH.  Determine need for Synthroid once this returns.

## 2019-01-15 NOTE — Progress Notes (Signed)
Subjective:   Tamara Whitehead is a 73 y.o. female who presents for Medicare Annual (Subsequent) preventive examination.  Review of Systems:  No ROS.  Medicare Wellness Virtual Visit.  Visual/audio telehealth visit, UTA vital signs.   Patient has in office visit later today with her doctor. See social history for additional risk factors.   Cardiac Risk Factors include: advanced age (>60men, >60 women)     Objective:     Vitals: There were no vitals taken for this visit.  There is no height or weight on file to calculate BMI.  Advanced Directives 01/15/2019 01/10/2018  Does Patient Have a Medical Advance Directive? Yes Yes  Type of Advance Directive Living will [name];Living will  Does patient want to make changes to medical advance directive? No - Patient declined No - Patient declined  Copy of [place] in Chart? - No - copy requested    Tobacco Social History   Tobacco Use  Smoking Status Current Every Day Smoker  . Packs/day: 0.25  . Types: Cigarettes  Smokeless Tobacco Never Used     Ready to quit: Not Answered Counseling given: Not Answered   Clinical Intake:  Pre-visit preparation completed: Yes        Diabetes: No  How often do you need to have someone help you when you read instructions, pamphlets, or other written materials from your doctor or pharmacy?: 1 - Never  Interpreter Needed?: No     Past Medical History:  Diagnosis Date  . Arthritis    Osteoarthritis/Rheumatoid  . Cancer (HCC)    Skin  . Cataract   . Elevated blood pressure   . Fibrocystic breast disease   . Glaucoma   . Hypercholesterolemia   . Thyroid disease    Hypothyroid   Past Surgical History:  Procedure Laterality Date  . ABDOMINAL HYSTERECTOMY     Total abd. hysterectomu and bilateral salping-oophorectomy  . EYE SURGERY Right    Glaucoma surgery  . HEMORRHOID SURGERY    . status post dilatation and curettage  1975  .  TONSILLECTOMY    . TUBAL LIGATION     Family History  Problem Relation Age of Onset  . CAD Mother   . Arthritis Mother        Rheumatoid Arthritis  . Arthritis Father        Rheumatoid/Osteoarthritis and Blindness, bleeding Tendencies,Glaucoma  . CAD Father   . Hypertension Maternal Grandmother   . COPD Neg Hx    Social History   Socioeconomic History  . Marital status: Single    Spouse name: Not on file  . Number of children: Not on file  . Years of education: Not on file  . Highest education level: Not on file  Occupational History  . Not on file  Social Needs  . Financial resource strain: Not very hard  . Food insecurity    Worry: Never true    Inability: Never true  . Transportation needs    Medical: No    Non-medical: No  Tobacco Use  . Smoking status: Current Every Day Smoker    Packs/day: 0.25    Types: Cigarettes  . Smokeless tobacco: Never Used  Substance and Sexual Activity  . Alcohol use: Yes    Comment: wine occasional  . Drug use: No  . Sexual activity: Not on file  Lifestyle  . Physical activity    Days per week: 5 days    Minutes per session: 30  min  . Stress: Only a little  Relationships  . Social Herbalist on phone: Not on file    Gets together: Not on file    Attends religious service: Not on file    Active member of club or organization: Not on file    Attends meetings of clubs or organizations: Not on file    Relationship status: Not on file  Other Topics Concern  . Not on file  Social History Narrative  . Not on file    Outpatient Encounter Medications as of 01/15/2019  Medication Sig  . Adalimumab 40 MG/0.8ML PNKT Inject 40 mg into the skin every 14 (fourteen) days.  . Calcium Carbonate (CALCIUM 600 PO) Take 600 mg by mouth daily.  . Coenzyme Q-10 100 MG capsule Take 100 mg by mouth daily.  Marland Kitchen ibuprofen (ADVIL,MOTRIN) 200 MG tablet Take by mouth.  . Multiple Vitamin tablet Take by mouth.  . naproxen sodium (ALEVE) 220  MG tablet Take 220 mg by mouth 2 (two) times daily with a meal.  . levothyroxine (SYNTHROID, LEVOTHROID) 75 MCG tablet TAKE 1 TABLET(75 MCG) BY MOUTH DAILY  . [DISCONTINUED] hydroxychloroquine (PLAQUENIL) 200 MG tablet TK 2 TS PO ONCE DAILY  . [DISCONTINUED] latanoprost (XALATAN) 0.005 % ophthalmic solution Apply to eye.  . [DISCONTINUED] rosuvastatin (CRESTOR) 20 MG tablet Take 1 tablet (20 mg total) by mouth daily.  . [DISCONTINUED] Vitamins C E (VITAMIN C/VITAMIN E PO) Take 1 capsule by mouth daily.   No facility-administered encounter medications on file as of 01/15/2019.     Activities of Daily Living In your present state of health, do you have any difficulty performing the following activities: 01/15/2019  Hearing? N  Vision? N  Difficulty concentrating or making decisions? N  Walking or climbing stairs? N  Dressing or bathing? N  Doing errands, shopping? N  Preparing Food and eating ? N  Using the Toilet? N  In the past six months, have you accidently leaked urine? N  Do you have problems with loss of bowel control? N  Managing your Medications? N  Managing your Finances? N  Housekeeping or managing your Housekeeping? N  Some recent data might be hidden    Patient Care Team: Leone Haven, MD as PCP - General (Family Medicine)    Assessment:   This is a routine wellness examination for Tamara Whitehead.  I connected with patient 01/15/19 at  9:00 AM EDT by an audio enabled telemedicine application and verified that I am speaking with the correct person using two identifiers. Patient stated full name and DOB. Patient gave permission to continue with virtual visit. Patient's location was at home and Nurse's location was at [place] office.   Health Maintenance Due: Influenza vaccine 2020- discussed; to be completed in season with doctor or local pharmacy.   Tdap- postponed per patient preference.   Dexa Scan- waiting for pandemic to improve; postponed. Mammogram- waiting for  pandemic to improve; postponed. Eye Exam- plans to schedule Colonoscopy- declined Cologuard- consent given  Hgb A1c- 12/12/16 (5.3) Update all pending maintenance due as appropriate.   See completed HM at the end of note.   Eye: Visual acuity not assessed. Virtual visit. Wears corrective lenses. She plans to schedule.Glaucoma- drops no longer in use until next scheduled appointment.  Dental: Visits every 6 months.    Hearing: Demonstrates normal hearing during visit.  Safety:  Patient feels safe at home- yes Patient does have smoke detectors at home- yes Patient does  wear sunscreen or protective clothing when in direct sunlight - yes Patient does wear seat belt when in a moving vehicle - yes Patient drives- yes Adequate lighting in walkways free from debris- yes Grab bars and handrails used as appropriate- yes Ambulates with no assistive device  Social: Alcohol intake - yes      Smoking history- current Smokers in home? self Illicit drug use? none  Depression: PHQ 2 &9 complete. See screening below. Denies irritability, anhedonia, sadness/tearfullness.  Stable.   Falls: See screening below.    Medication: Taking as directed and without issues. Holding levothyroxine until blood work complete.   Covid-19: Precautions and sickness symptoms discussed. Wears mask, social distancing, hand hygiene as appropriate.   Activities of Daily Living Patient denies needing assistance with: household chores, feeding themselves, getting from bed to chair, getting to the toilet, bathing/showering, dressing, managing money, or preparing meals.   Memory: Patient is alert. Patient denies difficulty focusing or concentrating. Correctly identified the president of the [place], season and recall. Patient likes to read and works full time as a Radio broadcast assistant for brain stimulation.  BMI- discussed the importance of a healthy diet, water intake and the benefits of aerobic exercise.  Educational  material provided.  Physical activity- walking 5 days weekly, 30 minutes  Diet: regular Water: good intake  Advanced Directive: End of life planning; Advance aging; Advanced directives discussed.  Copy of current HCPOA/Living Will requested.    Other Providers Patient Care Team: Leone Haven, MD as PCP - General (Family Medicine)  Exercise Activities and Dietary recommendations Current Exercise Habits: Home exercise routine, Type of exercise: walking, Intensity: Mild  Goals      Patient Stated   . Follow up with Primary Care Provider (pt-stated)       Fall Risk Fall Risk  01/15/2019 01/10/2018  Falls in the past year? 0 No   Depression Screen PHQ 2/9 Scores 01/15/2019 01/10/2018 11/07/2016  PHQ - 2 Score 1 0 0  PHQ- 9 Score - - 2     Cognitive Function MMSE - Mini Mental State Exam 01/10/2018  Orientation to time 5  Orientation to Place 5  Registration 3  Attention/ Calculation 5  Recall 3  Language- name 2 objects 2  Language- repeat 1  Language- follow 3 step command 3  Language- read & follow direction 1  Write a sentence 1  Copy design 1  Total score 30     6CIT Screen 01/15/2019  What Year? 0 points  What month? 0 points  What time? 0 points  Count back from 20 0 points  Months in reverse 0 points  Repeat phrase 0 points  Total Score 0    Immunization History  Administered Date(s) Administered  . H1N1 04/07/2008  . Influenza Split 05/28/2010  . PPD Test 05/11/2015  . Pneumococcal Conjugate-13 09/29/2014  . Pneumococcal Polysaccharide-23 10/07/2011  . Pneumococcal-Unspecified 10/07/2011   Screening Tests Health Maintenance  Topic Date Due  . Fecal DNA (Cologuard)  01/25/1995  . MAMMOGRAM  08/07/2019 (Originally 01/25/1995)  . DEXA SCAN  08/07/2019 (Originally 01/24/2010)  . INFLUENZA VACCINE  08/21/2019 (Originally 12/22/2018)  . TETANUS/TDAP  01/15/2020 (Originally [birth year]25/1965)  . Hepatitis C Screening  Completed  . PNA vac Low Risk Adult   Completed      Plan:    Keep all routine maintenance appointments.   Follow up 01/15/19 with your doctor  Medicare Attestation I have personally reviewed: The patient's medical and social history Their use of alcohol,  tobacco or illicit drugs Their current medications and supplements The patient's functional ability including ADLs,fall risks, home safety risks, cognitive, and hearing and visual impairment Diet and physical activities Evidence for depression   In addition, I have reviewed and discussed with patient certain preventive protocols, quality metrics, and best practice recommendations. A written personalized care plan for preventive services as well as general preventive health recommendations were provided to patient.     Varney Biles, LPN  01/19/5620

## 2019-01-15 NOTE — Patient Instructions (Signed)
Nice to see you. We will get lab work today. Please consider quitting smoking as we discussed. When you are ready to get a mammogram and bone density scan please let us know. Please get your flu shot at pharmacy.

## 2019-01-15 NOTE — Assessment & Plan Note (Addendum)
Smoking cessation counseling was provided.  Approximately 4 minutes were spent discussing the rationale for tobacco cessation and strategies for doing so.  Discussed the risk of lung cancer, oral cancer, cardiovascular disease, stroke, heart attack, and PAD.  Adjuncts, including nicotine patches, nicotine lozenges, varenicline and buproprion were recommended.  Plan for follow-up in 6 months regarding this. Discussed that the patient does not meet criteria for lung cancer screening given the duration of her smoking.

## 2019-01-15 NOTE — Patient Instructions (Addendum)
  Tamara Whitehead , Thank you for taking time to come for your Medicare Wellness Visit. I appreciate your ongoing commitment to your health goals. Please review the following plan we discussed and let me know if I can assist you in the future.   These are the goals we discussed: Goals      Patient Stated   . Follow up with Primary Care Provider (pt-stated)       This is a list of the screening recommended for you and due dates:  Health Maintenance  Topic Date Due  . Tetanus Vaccine  01/25/1964  . Mammogram  01/25/1995  . Colon Cancer Screening  01/25/1995  . DEXA scan (bone density measurement)  01/24/2010  . Flu Shot  12/22/2018  .  Hepatitis C: One time screening is recommended by Center for Disease Control  (CDC) for  adults born from [birth year] through 1966.   Completed  . Pneumonia vaccines  Completed

## 2019-01-15 NOTE — Assessment & Plan Note (Signed)
History of this.  Check CBC.

## 2019-01-15 NOTE — Progress Notes (Signed)
Tommi Rumps, MD Phone: [phone]  Tamara Whitehead is a 73 y.o. female who presents today for f/u.  HYPOTHYROIDISM Disease Monitoring Weight changes: stable  Skin Changes: no  Heat/Cold intolerance: no Constipation: No  Medication Monitoring Compliance:  Off medication   Last TSH:   Lab Results  Component Value Date   TSH 1.54 11/14/2016   HYPERLIPIDEMIA Symptoms Chest pain on exertion:  no   Leg claudication:   no Medications: Compliance- not on medication   Rheumatoid arthritis: Patient is on Humira.  This is working quite well and she has had no flare since going on this.  No longer on Plaquenil.  Rare arthritic pain.  Tobacco abuse: Smoking half a pack per day.  She is not ready to quit.  She used to smoke a pack per day.  She started smoking in her 17s.    Social History   Tobacco Use  Smoking Status Current Every Day Smoker  . Packs/day: 0.25  . Types: Cigarettes  Smokeless Tobacco Never Used     ROS see history of present illness  Objective  Physical Exam Vitals:   01/15/19 0943  BP: 110/70  Pulse: 61  Temp: 97.9 F (36.6 C)  SpO2: 99%    BP Readings from Last 3 Encounters:  01/15/19 110/70  01/10/18 (!) 142/82  03/14/17 (!) 174/67   Wt Readings from Last 3 Encounters:  01/15/19 109 lb 3.2 oz (49.5 kg)  01/10/18 108 lb (49 kg)  03/14/17 111 lb (50.3 kg)    Physical Exam Constitutional:      General: She is not in acute distress.    Appearance: She is not diaphoretic.  Neck:     Thyroid: No thyroid mass, thyromegaly or thyroid tenderness.  Cardiovascular:     Rate and Rhythm: Normal rate and regular rhythm.     Heart sounds: Normal heart sounds.  Pulmonary:     Effort: Pulmonary effort is normal.     Breath sounds: Normal breath sounds.  Musculoskeletal:     Right lower leg: No edema.     Left lower leg: No edema.  Skin:    General: Skin is warm and dry.  Neurological:     Mental Status: She is alert.       Assessment/Plan: Please see individual problem list.  Adult hypothyroidism Check TSH.  Determine need for Synthroid once this returns.  Rheumatoid arthritis ([place]) Stable.  She will continue to see rheumatology.  Current tobacco use Smoking cessation counseling was provided.  Approximately 4 minutes were spent discussing the rationale for tobacco cessation and strategies for doing so.  Discussed the risk of lung cancer, oral cancer, cardiovascular disease, stroke, heart attack, and PAD.  Adjuncts, including nicotine patches, nicotine lozenges, varenicline and buproprion were recommended.  Plan for follow-up in 6 months regarding this. Discussed that the patient does not meet criteria for lung cancer screening given the duration of her smoking.  Hypercholesteremia Check lipid panel.  Thrombocytopenia ([place]) History of this.  Check CBC.   Health Maintenance: Patient deferred flu shot until later in the season.  She will get this at the pharmacy.  She defers mammogram and bone density scan given the COVID-19 pandemic.  She understands the risk that we could be missing a breast cancer though she notes no breast lesions or nipple discharge or any other breast issues.  She understands the risk of fractures without completing a bone density scan to determine if she has osteoporosis.  She is going to  complete Cologuard.  She denies family history of colon cancer.  No personal history of polyps.  No rectal bleeding.  I did have a discussion with her that if the Cologuard was positive she may be responsible for the cost of her colonoscopy as it would no longer be a screening test and that she would need to undergo a colonoscopy if it was positive.  Orders Placed This Encounter  Procedures  . Comp Met (CMET)  . TSH  . Lipid panel  . CBC    No orders of the defined types were placed in this encounter.    Tommi Rumps, MD [place]

## 2019-01-16 ENCOUNTER — Other Ambulatory Visit: Payer: Self-pay | Admitting: Family Medicine

## 2019-01-16 DIAGNOSIS — E039 Hypothyroidism, unspecified: Secondary | ICD-10-CM

## 2019-01-16 DIAGNOSIS — D582 Other hemoglobinopathies: Secondary | ICD-10-CM

## 2019-01-16 MED ORDER — LEVOTHYROXINE SODIUM 50 MCG PO TABS
50.0000 ug | ORAL_TABLET | Freq: Every day | ORAL | 1 refills | Status: DC
Start: 2019-01-16 — End: 2019-08-29

## 2019-01-16 NOTE — Progress Notes (Signed)
I have reviewed the above note and agree.  Aiyla Baucom, M.D.  

## 2019-01-17 ENCOUNTER — Telehealth: Payer: Self-pay

## 2019-01-17 ENCOUNTER — Telehealth: Payer: Self-pay | Admitting: Family Medicine

## 2019-01-17 DIAGNOSIS — E782 Mixed hyperlipidemia: Secondary | ICD-10-CM

## 2019-01-17 NOTE — Telephone Encounter (Signed)
Patient returning call for lab results. Not specified if [facility] can release- no documentation.

## 2019-01-18 NOTE — Telephone Encounter (Signed)
err

## 2019-01-20 MED ORDER — ROSUVASTATIN CALCIUM 40 MG PO TABS: 40.0000 mg | ORAL_TABLET | Freq: Every day | ORAL | 3 refills | Status: DC

## 2019-01-20 NOTE — Telephone Encounter (Signed)
Crestor sent to pharmacy. If it is too expensive she should contact us and let us know. She will need labs 6 weeks after she starts the medication. Please get her scheduled for those. Please include in the lab notes that the lipid panel and the hepatic function panel do not need to be completed until 6 weeks from now and please change the 02/07/19 lab notes to say that only the cbc should be drawn. Thanks.

## 2019-01-21 NOTE — Telephone Encounter (Signed)
Called and spoke with patient and informed her the medication Crestor was sent to pharmacy and if it too expensive to let us know.  Also patient will call back to schedule labs because she is not picking up RX today.  Nina,cma

## 2019-01-21 NOTE — Telephone Encounter (Signed)
Called and spoke with patient and patient understood her results and she will call back after she picks up the thyroid medication to schedule in 6 weeks, already has appt for labs for her cholesterol.  Tamara Whitehead,cma

## 2019-02-07 ENCOUNTER — Other Ambulatory Visit: Payer: Medicare HMO

## 2019-03-25 DIAGNOSIS — Z1211 Encounter for screening for malignant neoplasm of colon: Secondary | ICD-10-CM | POA: Diagnosis not present

## 2019-03-30 LAB — COLOGUARD: Cologuard: NEGATIVE

## 2019-04-04 ENCOUNTER — Ambulatory Visit (INDEPENDENT_AMBULATORY_CARE_PROVIDER_SITE_OTHER): Payer: Medicare HMO | Admitting: Internal Medicine

## 2019-04-04 ENCOUNTER — Encounter: Payer: Self-pay | Admitting: Emergency Medicine

## 2019-04-04 ENCOUNTER — Other Ambulatory Visit: Payer: Self-pay

## 2019-04-04 ENCOUNTER — Emergency Department: Payer: Medicare HMO

## 2019-04-04 ENCOUNTER — Inpatient Hospital Stay
Admission: EM | Admit: 2019-04-04 | Discharge: 2019-04-10 | DRG: 871 | Disposition: A | Discharge status: Home Health | Payer: Medicare HMO | Attending: Family Medicine | Admitting: Family Medicine

## 2019-04-04 VITALS — Ht 61.0 in | Wt 102.0 lb

## 2019-04-04 DIAGNOSIS — R05 Cough: Secondary | ICD-10-CM

## 2019-04-04 DIAGNOSIS — R059 Cough, unspecified: Secondary | ICD-10-CM

## 2019-04-04 DIAGNOSIS — J9601 Acute respiratory failure with hypoxia: Secondary | ICD-10-CM | POA: Diagnosis not present

## 2019-04-04 DIAGNOSIS — A419 Sepsis, unspecified organism: Principal | ICD-10-CM | POA: Diagnosis present

## 2019-04-04 DIAGNOSIS — J181 Lobar pneumonia, unspecified organism: Secondary | ICD-10-CM | POA: Diagnosis not present

## 2019-04-04 DIAGNOSIS — Z8701 Personal history of pneumonia (recurrent): Secondary | ICD-10-CM

## 2019-04-04 DIAGNOSIS — E876 Hypokalemia: Secondary | ICD-10-CM | POA: Diagnosis present

## 2019-04-04 DIAGNOSIS — J90 Pleural effusion, not elsewhere classified: Secondary | ICD-10-CM | POA: Diagnosis not present

## 2019-04-04 DIAGNOSIS — D849 Immunodeficiency, unspecified: Secondary | ICD-10-CM | POA: Insufficient documentation

## 2019-04-04 DIAGNOSIS — J189 Pneumonia, unspecified organism: Secondary | ICD-10-CM

## 2019-04-04 DIAGNOSIS — R748 Abnormal levels of other serum enzymes: Secondary | ICD-10-CM

## 2019-04-04 DIAGNOSIS — Z20828 Contact with and (suspected) exposure to other viral communicable diseases: Secondary | ICD-10-CM | POA: Diagnosis present

## 2019-04-04 DIAGNOSIS — E039 Hypothyroidism, unspecified: Secondary | ICD-10-CM | POA: Diagnosis present

## 2019-04-04 DIAGNOSIS — M069 Rheumatoid arthritis, unspecified: Secondary | ICD-10-CM | POA: Diagnosis present

## 2019-04-04 DIAGNOSIS — E785 Hyperlipidemia, unspecified: Secondary | ICD-10-CM | POA: Diagnosis present

## 2019-04-04 DIAGNOSIS — D696 Thrombocytopenia, unspecified: Secondary | ICD-10-CM | POA: Diagnosis present

## 2019-04-04 DIAGNOSIS — E871 Hypo-osmolality and hyponatremia: Secondary | ICD-10-CM | POA: Diagnosis not present

## 2019-04-04 DIAGNOSIS — I4892 Unspecified atrial flutter: Secondary | ICD-10-CM | POA: Diagnosis not present

## 2019-04-04 DIAGNOSIS — B370 Candidal stomatitis: Secondary | ICD-10-CM | POA: Diagnosis not present

## 2019-04-04 DIAGNOSIS — R112 Nausea with vomiting, unspecified: Secondary | ICD-10-CM | POA: Diagnosis present

## 2019-04-04 DIAGNOSIS — R7989 Other specified abnormal findings of blood chemistry: Secondary | ICD-10-CM | POA: Diagnosis not present

## 2019-04-04 DIAGNOSIS — E860 Dehydration: Secondary | ICD-10-CM | POA: Diagnosis present

## 2019-04-04 DIAGNOSIS — R000 Tachycardia, unspecified: Secondary | ICD-10-CM | POA: Diagnosis present

## 2019-04-04 DIAGNOSIS — R531 Weakness: Secondary | ICD-10-CM | POA: Diagnosis not present

## 2019-04-04 DIAGNOSIS — Z85828 Personal history of other malignant neoplasm of skin: Secondary | ICD-10-CM

## 2019-04-04 DIAGNOSIS — F419 Anxiety disorder, unspecified: Secondary | ICD-10-CM | POA: Diagnosis present

## 2019-04-04 DIAGNOSIS — E440 Moderate protein-calorie malnutrition: Secondary | ICD-10-CM | POA: Diagnosis not present

## 2019-04-04 DIAGNOSIS — H409 Unspecified glaucoma: Secondary | ICD-10-CM | POA: Diagnosis present

## 2019-04-04 DIAGNOSIS — Z90710 Acquired absence of both cervix and uterus: Secondary | ICD-10-CM

## 2019-04-04 DIAGNOSIS — J309 Allergic rhinitis, unspecified: Secondary | ICD-10-CM | POA: Diagnosis present

## 2019-04-04 DIAGNOSIS — R42 Dizziness and giddiness: Secondary | ICD-10-CM

## 2019-04-04 DIAGNOSIS — I639 Cerebral infarction, unspecified: Secondary | ICD-10-CM | POA: Diagnosis not present

## 2019-04-04 DIAGNOSIS — Z79890 Hormone replacement therapy (postmenopausal): Secondary | ICD-10-CM

## 2019-04-04 DIAGNOSIS — F17210 Nicotine dependence, cigarettes, uncomplicated: Secondary | ICD-10-CM | POA: Diagnosis present

## 2019-04-04 DIAGNOSIS — K760 Fatty (change of) liver, not elsewhere classified: Secondary | ICD-10-CM | POA: Diagnosis present

## 2019-04-04 DIAGNOSIS — Z885 Allergy status to narcotic agent status: Secondary | ICD-10-CM

## 2019-04-04 DIAGNOSIS — I10 Essential (primary) hypertension: Secondary | ICD-10-CM | POA: Diagnosis present

## 2019-04-04 DIAGNOSIS — I4891 Unspecified atrial fibrillation: Secondary | ICD-10-CM | POA: Diagnosis not present

## 2019-04-04 DIAGNOSIS — Z8261 Family history of arthritis: Secondary | ICD-10-CM

## 2019-04-04 DIAGNOSIS — Z79899 Other long term (current) drug therapy: Secondary | ICD-10-CM

## 2019-04-04 DIAGNOSIS — Z681 Body mass index (BMI) 19 or less, adult: Secondary | ICD-10-CM | POA: Diagnosis not present

## 2019-04-04 DIAGNOSIS — R9431 Abnormal electrocardiogram [ECG] [EKG]: Secondary | ICD-10-CM | POA: Diagnosis not present

## 2019-04-04 DIAGNOSIS — R55 Syncope and collapse: Secondary | ICD-10-CM

## 2019-04-04 DIAGNOSIS — Z8249 Family history of ischemic heart disease and other diseases of the circulatory system: Secondary | ICD-10-CM

## 2019-04-04 HISTORY — DX: Pneumonia, unspecified organism: J18.9

## 2019-04-04 LAB — CBC WITH DIFFERENTIAL/PLATELET
Abs Immature Granulocytes: 0.29 10*3/uL — ABNORMAL HIGH (ref 0.00–0.07)
Basophils Absolute: 0.1 10*3/uL (ref 0.0–0.1)
Basophils Relative: 0 %
Eosinophils Absolute: 0 10*3/uL (ref 0.0–0.5)
Eosinophils Relative: 0 %
HCT: 44.7 % (ref 36.0–46.0)
Hemoglobin: 15.7 g/dL — ABNORMAL HIGH (ref 12.0–15.0)
Immature Granulocytes: 1 %
Lymphocytes Relative: 2 %
Lymphs Abs: 0.5 10*3/uL — ABNORMAL LOW (ref 0.7–4.0)
MCH: 32.5 pg (ref 26.0–34.0)
MCHC: 35.1 g/dL (ref 30.0–36.0)
MCV: 92.5 fL (ref 80.0–100.0)
Monocytes Absolute: 0.6 10*3/uL (ref 0.1–1.0)
Monocytes Relative: 2 %
Neutro Abs: 23.2 10*3/uL — ABNORMAL HIGH (ref 1.7–7.7)
Neutrophils Relative %: 95 %
Platelets: 156 10*3/uL (ref 150–400)
RBC: 4.83 MIL/uL (ref 3.87–5.11)
RDW: 12.9 % (ref 11.5–15.5)
WBC: 24.6 10*3/uL — ABNORMAL HIGH (ref 4.0–10.5)
nRBC: 0 % (ref 0.0–0.2)

## 2019-04-04 LAB — PROTIME-INR
INR: 1.4 — ABNORMAL HIGH (ref 0.8–1.2)
Prothrombin Time: 17 seconds — ABNORMAL HIGH (ref 11.4–15.2)

## 2019-04-04 LAB — COMPREHENSIVE METABOLIC PANEL
ALT: 60 U/L — ABNORMAL HIGH (ref 0–44)
AST: 59 U/L — ABNORMAL HIGH (ref 15–41)
Albumin: 3.8 g/dL (ref 3.5–5.0)
Alkaline Phosphatase: 70 U/L (ref 38–126)
Anion gap: 14 (ref 5–15)
BUN: 14 mg/dL (ref 8–23)
CO2: 23 mmol/L (ref 22–32)
Calcium: 9.3 mg/dL (ref 8.9–10.3)
Chloride: 94 mmol/L — ABNORMAL LOW (ref 98–111)
Creatinine, Ser: 1.1 mg/dL — ABNORMAL HIGH (ref 0.44–1.00)
GFR calc Af Amer: 57 mL/min — ABNORMAL LOW (ref >60)
GFR calc non Af Amer: 49 mL/min — ABNORMAL LOW (ref >60)
Glucose, Bld: 150 mg/dL — ABNORMAL HIGH (ref 70–99)
Potassium: 4 mmol/L (ref 3.5–5.1)
Sodium: 131 mmol/L — ABNORMAL LOW (ref 135–145)
Total Bilirubin: 1.2 mg/dL (ref 0.3–1.2)
Total Protein: 7.8 g/dL (ref 6.5–8.1)

## 2019-04-04 LAB — LACTIC ACID, PLASMA: Lactic Acid, Venous: 1.8 mmol/L (ref 0.5–1.9)

## 2019-04-04 LAB — URINALYSIS, COMPLETE (UACMP) WITH MICROSCOPIC
Bilirubin Urine: NEGATIVE
Glucose, UA: NEGATIVE mg/dL
Ketones, ur: NEGATIVE mg/dL
Leukocytes,Ua: NEGATIVE
Nitrite: NEGATIVE
Protein, ur: 100 mg/dL — AB
Specific Gravity, Urine: 1.021 (ref 1.005–1.030)
pH: 5 (ref 5.0–8.0)

## 2019-04-04 MED ORDER — SODIUM CHLORIDE 0.9 % IV BOLUS (SEPSIS)
500.0000 mL | Freq: Once | INTRAVENOUS | Status: AC
  Administered 2019-04-04: 500 mL via INTRAVENOUS

## 2019-04-04 MED ORDER — VANCOMYCIN HCL IN DEXTROSE 1-5 GM/200ML-% IV SOLN
1000.0000 mg | Freq: Once | INTRAVENOUS | Status: AC
  Administered 2019-04-04: 1000 mg via INTRAVENOUS
  Filled 2019-04-04: qty 200

## 2019-04-04 MED ORDER — SODIUM CHLORIDE 0.9 % IV BOLUS (SEPSIS)
1000.0000 mL | Freq: Once | INTRAVENOUS | Status: AC
  Administered 2019-04-04: 17:00:00 1000 mL via INTRAVENOUS

## 2019-04-04 MED ORDER — METRONIDAZOLE IN NACL 5-0.79 MG/ML-% IV SOLN
500.0000 mg | Freq: Once | INTRAVENOUS | Status: AC
  Administered 2019-04-04: 500 mg via INTRAVENOUS
  Filled 2019-04-04: qty 100

## 2019-04-04 MED ORDER — ACETAMINOPHEN 325 MG PO TABS
ORAL_TABLET | ORAL | Status: AC
  Administered 2019-04-05: 650 mg via ORAL
  Filled 2019-04-04: qty 2

## 2019-04-04 MED ORDER — ACETAMINOPHEN 325 MG PO TABS
650.0000 mg | ORAL_TABLET | Freq: Once | ORAL | Status: AC | PRN
  Administered 2019-04-04: 16:00:00 650 mg via ORAL

## 2019-04-04 MED ORDER — ONDANSETRON HCL 4 MG/2ML IJ SOLN
4.0000 mg | Freq: Four times a day (QID) | INTRAMUSCULAR | Status: DC | PRN
  Administered 2019-04-04 – 2019-04-08 (×3): 4 mg via INTRAVENOUS
  Filled 2019-04-04 (×3): qty 2

## 2019-04-04 MED ORDER — SODIUM CHLORIDE 0.9 % IV SOLN
2.0000 g | Freq: Once | INTRAVENOUS | Status: AC
  Administered 2019-04-04: 2 g via INTRAVENOUS
  Filled 2019-04-04: qty 2

## 2019-04-04 NOTE — ED Triage Notes (Signed)
Here for syncope today.  Pt has had vomiting for 2 days.  Mucous membranes dry.  Febrile in triage.  Generalized weakness.  Unlabored.  Pain between shoulder blades per pt.  No chest pain.

## 2019-04-04 NOTE — H&P (Addendum)
History and Physical:    DAILYNN NANCARROW   GGY:[id number] DOB: May 02, 1946 DOA: 04/04/2019  Referring MD/provider:  PCP: Leone Haven, MD   Patient coming from: Dr. Duffy Bruce  Chief Complaint: Vomiting and "feeling faint"  History of Present Illness:   Tamara Whitehead is an 73 y.o. female with medical history significant for rheumatoid arthritis on Humira, recent use of prednisone, osteoarthritis, hypercholesterolemia, hypothyroidism.  She presented to the hospital because of vomiting and "feeling faint".  Said she started vomiting about 2 days ago.  She vomited about 4 times today.  Vomitus is nonbloody but it was not much because she had nothing in her stomach.  She has also had a cough that is productive of clear sputum for about 2 days now.  She has pleuritic pain between her shoulder blades when she coughs.  She was feeling very weak today and she was unsteady on her feet.  She also felt faint.  She did not check her temperature at home.  No shortness of breath, wheezing, abdominal pain, diarrhea, headache, urinary symptoms or loss of consciousness.  She has not traveled recently and she has not had any contact with individuals with coronavirus infection.   ED Course: Patient was found to be febrile and tachycardic the patient had a chest x-ray in the ED which showed right upper lobe pneumonia.  She received IV vancomycin, cefepime and Flagyl.  She was also given 1.5 L of normal saline.  ROS:   ROS all other systems reviewed were negative  Past Medical History:   Past Medical History:  Diagnosis Date  . Arthritis    Osteoarthritis/Rheumatoid  . Cancer (HCC)    Skin  . Cataract   . Elevated blood pressure   . Fibrocystic breast disease   . Glaucoma   . Hypercholesterolemia   . Thyroid disease    Hypothyroid    Past Surgical History:   Past Surgical History:  Procedure Laterality Date  . ABDOMINAL HYSTERECTOMY     Total abd. hysterectomu and bilateral  salping-oophorectomy  . EYE SURGERY Right    Glaucoma surgery  . HEMORRHOID SURGERY    . status post dilatation and curettage  1975  . TONSILLECTOMY    . TUBAL LIGATION      Social History:   Social History   Socioeconomic History  . Marital status: Single    Spouse name: Not on file  . Number of children: Not on file  . Years of education: Not on file  . Highest education level: Not on file  Occupational History  . Not on file  Social Needs  . Financial resource strain: Not very hard  . Food insecurity    Worry: Never true    Inability: Never true  . Transportation needs    Medical: No    Non-medical: No  Tobacco Use  . Smoking status: Current Every Day Smoker    Packs/day: 0.25    Types: Cigarettes  . Smokeless tobacco: Never Used  Substance and Sexual Activity  . Alcohol use: Yes    Comment: wine occasional  . Drug use: No  . Sexual activity: Not on file  Lifestyle  . Physical activity    Days per week: 5 days    Minutes per session: 30 min  . Stress: Only a little  Relationships  . Social Herbalist on phone: Not on file    Gets together: Not on file    Attends religious service:  Not on file    Active member of club or organization: Not on file    Attends meetings of clubs or organizations: Not on file    Relationship status: Not on file  . Intimate partner violence    Fear of current or ex partner: Not on file    Emotionally abused: Not on file    Physically abused: Not on file    Forced sexual activity: Not on file  Other Topics Concern  . Not on file  Social History Narrative  . Not on file    Allergies   Codeine  Family history:   Family History  Problem Relation Age of Onset  . CAD Mother   . Arthritis Mother        Rheumatoid Arthritis  . Arthritis Father        Rheumatoid/Osteoarthritis and Blindness, bleeding Tendencies,Glaucoma  . CAD Father   . Hypertension Maternal Grandmother   . COPD Neg Hx     Current  Medications:   Prior to Admission medications   Medication Sig Start Date End Date Taking? Authorizing Provider  Adalimumab 40 MG/0.8ML PNKT Inject 40 mg into the skin every 14 (fourteen) days. 10/24/16  Yes [provider]  Calcium Carbonate (CALCIUM 600 PO) Take 600 mg by mouth daily.   Yes [provider]  Coenzyme Q-10 100 MG capsule Take 100 mg by mouth daily.   Yes [provider]  levothyroxine (SYNTHROID) 50 MCG tablet Take 1 tablet (50 mcg total) by mouth daily before breakfast. 01/16/19  Yes Leone Haven, MD  rosuvastatin (CRESTOR) 40 MG tablet Take 1 tablet (40 mg total) by mouth daily. 01/20/19  Yes Leone Haven, MD  naproxen sodium (ALEVE) 220 MG tablet Take 220 mg by mouth 2 (two) times daily with a meal.    [provider]    Physical Exam:   Vitals:   04/04/19 1519  BP: 134/76  Pulse: (!) 110  Resp: 18  Temp: (!) 102.3 F (39.1 C)  TempSrc: Oral  SpO2: 97%     Physical Exam: Blood pressure 134/76, pulse (!) 110, temperature (!) 102.3 F (39.1 C), temperature source Oral, resp. rate 18, SpO2 97 %. Gen: No acute distress. Head: Normocephalic, atraumatic. Eyes: Pupils equal, round and reactive to light. Extraocular movements intact.  Sclerae nonicteric. No lid lag. Mouth: Oropharynx normal Neck: Supple, no thyromegaly, no lymphadenopathy, no jugular venous distention. Chest: Lungs are clear to auscultation with good air movement. No rales, rhonchi or wheezes.  CV: Heart sounds are regular with an S1, S2. No murmurs, rubs, clicks, or gallops.  Abdomen: Soft, nontender, nondistended with normal active bowel sounds. No hepatosplenomegaly or palpable masses. Extremities: Extremities are without clubbing, or cyanosis. No edema. Pedal pulses 2+.  Skin: Warm and dry. No rashes, lesions or wounds. Neuro: Alert and oriented times 3; grossly nonfocal.  Psych: Insight is good and judgment is appropriate. Mood and affect  normal.   Data Review:    Labs: Basic Metabolic Panel: Recent Labs  Lab 04/04/19 1530  NA 131*  K 4.0  CL 94*  CO2 23  GLUCOSE 150*  BUN 14  CREATININE 1.10*  CALCIUM 9.3   Liver Function Tests: Recent Labs  Lab 04/04/19 1530  AST 59*  ALT 60*  ALKPHOS 70  BILITOT 1.2  PROT 7.8  ALBUMIN 3.8   No results for input(s): LIPASE, AMYLASE in the last 168 hours. No results for input(s): AMMONIA in the last 168 hours. CBC:  Recent Labs  Lab 04/04/19 1530  WBC 24.6*  NEUTROABS 23.2*  HGB 15.7*  HCT 44.7  MCV 92.5  PLT 156   Cardiac Enzymes: No results for input(s): CKTOTAL, CKMB, CKMBINDEX, TROPONINI in the last 168 hours.  BNP (last 3 results) No results for input(s): PROBNP in the last 8760 hours. CBG: No results for input(s): GLUCAP in the last 168 hours.  Urinalysis No results found for: COLORURINE, APPEARANCEUR, LABSPEC, PHURINE, GLUCOSEU, HGBUR, BILIRUBINUR, KETONESUR, PROTEINUR, UROBILINOGEN, NITRITE, LEUKOCYTESUR    Radiographic Studies: Dg Chest 2 View  Result Date: 04/04/2019 CLINICAL DATA:  Patient feels like she is going to pass out. Nauseous for 2 days. EXAM: CHEST - 2 VIEW COMPARISON:  01/19/2011 FINDINGS: There is focal consolidation in the right upper lobe that is new from the prior study. This is consistent with pneumonia. No other areas of lung consolidation. Lungs are hyperexpanded. There is linear scarring or atelectasis at the bases. No evidence of pulmonary edema. No pleural effusion or pneumothorax. Cardiac silhouette is normal in size. No mediastinal or hilar masses. No evidence of adenopathy. Skeletal structures are intact. IMPRESSION: Right upper lobe consolidation consistent with pneumonia. Electronically Signed   By: Lajean Manes M.D.   On: 04/04/2019 16:25    EKG: Independently reviewed.  Sinus tachycardia, no acute ST-T changes.   Assessment/Plan:   Active Problems:   Community acquired pneumonia   Hyponatremia   Nausea and  vomiting   Dehydration   Sepsis secondary to community-acquired pneumonia in an immunocompromised patient: Admit to medical unit and monitor on telemetry. She is febrile, tachycardic and has significant leukocytosis.  Treat with empiric IV Rocephin and azithromycin.  Place on contact and airborne isolation while coronavirus test is pending.  Hyponatremia likely from dehydration: She has already received IV fluids in the ED. Hold additional IV fluids for now and repeat BMP tomorrow.  Nausea and vomiting: Antiemetics as needed  Mildly elevated liver enzymes: Repeat CMP tomorrow  Rheumatoid arthritis and osteoarthritis: Analgesics as needed for pain       There is no height or weight on file to calculate BMI.  Other information:   DVT prophylaxis: Lovenox ordered. Code Status: Full code. Family Communication: Plan discussed with patient and her son, Ronalee Belts, at the bedside. Disposition Plan: Possible discharge to home in 2 to 3 days Consults called: None Admission status: Inpatient  The medical decision making on this patient was of high complexity and the patient is at high risk for clinical deterioration, therefore this is a level 3 visit.  The medical decision making is of moderate complexity, therefore this is a level 2 visit.  Jennye Boroughs [facility] Pager [phone]   How to contact the [facility] Attending or Consulting provider [place] or covering provider during after hours [place], for this patient?   1. Check the care team in [facility] and look for a) attending/consulting [facility] provider listed and b) the [facility] team listed 2. Log into [email or web address] and use [place]'s universal password to access. If you do not have the password, please contact the hospital operator. 3. Locate the [facility] provider you are looking for under [facility] and page to a number that you can be directly reached. 4. If you still have difficulty reaching the provider, please page the [facility]  (Director on Call) for the Hospitalists listed on amion for assistance.  04/04/2019, 6:28 PM

## 2019-04-04 NOTE — Progress Notes (Signed)
CODE SEPSIS - PHARMACY COMMUNICATION  **Broad Spectrum Antibiotics should be administered within 1 hour of Sepsis diagnosis**  Time Code Sepsis Called/Page Received: 1609  Antibiotics Ordered: cefepime, metronidzole, vancomycin  Time of 1st antibiotic administration: 1633  Additional action taken by pharmacy: [place] Resident 04/04/2019  4:20 PM

## 2019-04-04 NOTE — ED Notes (Signed)
Left AC cultures by dorian NT

## 2019-04-04 NOTE — Consult Note (Signed)
PHARMACY -  BRIEF ANTIBIOTIC NOTE   Pharmacy has received consult(s) for cefepime and vancomycin from an ED provider. Patient also ordered metronidazole. The patient's profile has been reviewed for ht/wt/allergies/indication/available labs. No allergies to antibiotics in chart.     Patient is a 73 y/o F who presents to [facility] ED c/o syncope, vomiting, fever. WBC 24.6 with left shift and febrile to 102.3.   One time order(s) placed for  -Vancomycin 1 g already ordered -Cefepime 2 g already ordered  Further antibiotics/pharmacy consults should be ordered by admitting physician if indicated.                       Thank you, [place] Resident 04/04/2019  4:16 PM

## 2019-04-04 NOTE — Progress Notes (Signed)
Telephone Note  I connected with Tamara Whitehead   on 04/04/19 at  1:00 PM EST by a telephone  and verified that I am speaking with the correct person using two identifiers.  Location patient: home Location provider:work or home office Persons participating in the virtual visit: patient, provider, pts son  I discussed the limitations of evaluation and management by telemedicine and the availability of in person appointments. The patient expressed understanding and agreed to proceed.   HPI: 1. Nausea/vomiting x 2 days and cough productive when she walks she is unsteady on her feet and feels like she is about to pass out and unable to tolerate po intake. No chest pain but having pain in mid back in between shoulder blades mild to moderate  H/o GB distension on previous imaging. Denies chest pain   ROS: See pertinent positives and negatives per HPI.  Past Medical History:  Diagnosis Date  . Arthritis    Osteoarthritis/Rheumatoid  . Cancer (HCC)    Skin  . Cataract   . Elevated blood pressure   . Fibrocystic breast disease   . Glaucoma   . Hypercholesterolemia   . Thyroid disease    Hypothyroid    Past Surgical History:  Procedure Laterality Date  . ABDOMINAL HYSTERECTOMY     Total abd. hysterectomu and bilateral salping-oophorectomy  . EYE SURGERY Right    Glaucoma surgery  . HEMORRHOID SURGERY    . status post dilatation and curettage  1975  . TONSILLECTOMY    . TUBAL LIGATION      Family History  Problem Relation Age of Onset  . CAD Mother   . Arthritis Mother        Rheumatoid Arthritis  . Arthritis Father        Rheumatoid/Osteoarthritis and Blindness, bleeding Tendencies,Glaucoma  . CAD Father   . Hypertension Maternal Grandmother   . COPD Neg Hx     SOCIAL HX: lives at home   Current Outpatient Medications:  .  Adalimumab 40 MG/0.8ML PNKT, Inject 40 mg into the skin every 14 (fourteen) days., Disp: , Rfl:  .  Calcium Carbonate (CALCIUM 600 PO), Take 600  mg by mouth daily., Disp: , Rfl:  .  Coenzyme Q-10 100 MG capsule, Take 100 mg by mouth daily., Disp: , Rfl:  .  ibuprofen (ADVIL,MOTRIN) 200 MG tablet, Take by mouth., Disp: , Rfl:  .  levothyroxine (SYNTHROID) 50 MCG tablet, Take 1 tablet (50 mcg total) by mouth daily before breakfast., Disp: 90 tablet, Rfl: 1 .  Multiple Vitamin tablet, Take by mouth., Disp: , Rfl:  .  naproxen sodium (ALEVE) 220 MG tablet, Take 220 mg by mouth 2 (two) times daily with a meal., Disp: , Rfl:  .  rosuvastatin (CRESTOR) 40 MG tablet, Take 1 tablet (40 mg total) by mouth daily., Disp: 90 tablet, Rfl: 3  EXAM:  VITALS per patient if applicable:  GENERAL: alert, oriented, appears well and in no acute distress   PSYCH/NEURO: pleasant and cooperative, no obvious depression or anxiety, speech and thought processing grossly intact  ASSESSMENT AND PLAN:  Discussed the following assessment and plan:  Presyncope with nausea vomiting and cough ddx likely dehydrated with possibility of pneumonia as pt is smoker. R/o PE with presyncope if orthostatics negative  Possibly GB issues with prev CBD dilatation and distension of GB on imaging causing nausea vs possibly pneumonia  -rec labs cmet, cbc, repeat tsh, UA. Trop, d dimer  -rec vitals, orthostatics -CXR, [name] -consider CTA chest  if d dimer elevated  -called ED charge RN and let her know pt on the way  Son will take pt to eD spoke with him  -we discussed possible serious and likely etiologies, options for evaluation and workup, limitations of telemedicine visit vs in person visit, treatment, treatment risks and precautions. Pt prefers to treat via telemedicine empirically rather then risking or undertaking an in person visit at this moment. Patient agrees to seek prompt in person care if worsening, new symptoms arise, or if is not improving with treatment.   I discussed the assessment and treatment plan with the patient. The patient was provided an opportunity to  ask questions and all were answered. The patient agreed with the plan and demonstrated an understanding of the instructions.   The patient was advised to call back or seek an in-person evaluation if the symptoms worsen or if the condition fails to improve as anticipated.  Time spent 15 min Delorise Jackson, MD

## 2019-04-04 NOTE — ED Provider Notes (Addendum)
[facility] Emergency Department Provider Note  ____________________________________________   First MD Initiated Contact with Patient 04/04/19 1611     (approximate)  I have reviewed the triage vital signs and the nursing notes.   HISTORY  Chief Complaint Emesis and Loss of Consciousness    HPI Tamara Whitehead is a 73 y.o. female with past medical history of rheumatoid arthritis on Humira and recent burst of prednisone here with nausea, vomiting, and generalized weakness.  The patient states that her symptoms started approximately 2 days ago.  She states that starting approximately 2-1/2 days ago, she began to feel very fatigued and nauseous.  She felt weak every time she tried to move and short of breath.  She then shortly thereafter developed a productive, harsh cough with occasional yellow-green sputum.  She has since had worsening chills and general fatigue.  She states that over the last 24 hours, her symptoms have worsened.  She has been essentially only able to tolerate a small amount of food and liquid over the last day.  She called her PCP today who told her to come to the ED.  She is had persistent worsening cough.  No alleviating factors.  No known coronavirus exposures.        Past Medical History:  Diagnosis Date  . Arthritis    Osteoarthritis/Rheumatoid  . Cancer (HCC)    Skin  . Cataract   . Elevated blood pressure   . Fibrocystic breast disease   . Glaucoma   . Hypercholesterolemia   . Thyroid disease    Hypothyroid    Patient Active Problem List   Diagnosis Date Noted  . Skin lesion 11/07/2016  . Allergic rhinitis 01/27/2015  . Current tobacco use 01/27/2015  . Elevated blood-pressure reading without diagnosis of hypertension 01/27/2015  . Leukocytosis 01/27/2015  . Hypercholesteremia 01/27/2015  . Adult hypothyroidism 01/27/2015  . Cannot sleep 01/27/2015  . Osteoarthrosis 01/27/2015  . Malignant neoplasm of skin 01/27/2015   . Elevated liver enzymes 01/27/2015  . Thrombocytopenia ([place]) 10/07/2013  . Rheumatoid arthritis ([place]) 10/22/2012    Past Surgical History:  Procedure Laterality Date  . ABDOMINAL HYSTERECTOMY     Total abd. hysterectomu and bilateral salping-oophorectomy  . EYE SURGERY Right    Glaucoma surgery  . HEMORRHOID SURGERY    . status post dilatation and curettage  1975  . TONSILLECTOMY    . TUBAL LIGATION      Prior to Admission medications   Medication Sig Start Date End Date Taking? Authorizing Provider  Adalimumab 40 MG/0.8ML PNKT Inject 40 mg into the skin every 14 (fourteen) days. 10/24/16   [provider]  Calcium Carbonate (CALCIUM 600 PO) Take 600 mg by mouth daily.    [provider]  Coenzyme Q-10 100 MG capsule Take 100 mg by mouth daily.    [provider]  ibuprofen (ADVIL,MOTRIN) 200 MG tablet Take by mouth.    [provider]  levothyroxine (SYNTHROID) 50 MCG tablet Take 1 tablet (50 mcg total) by mouth daily before breakfast. 01/16/19   Leone Haven, MD  Multiple Vitamin tablet Take by mouth. 06/22/10   [provider]  naproxen sodium (ALEVE) 220 MG tablet Take 220 mg by mouth 2 (two) times daily with a meal.    [provider]  rosuvastatin (CRESTOR) 40 MG tablet Take 1 tablet (40 mg total) by mouth daily. 01/20/19   Leone Haven, MD    Allergies Codeine  Family History  Problem Relation Age of Onset  . CAD Mother   . Arthritis Mother        Rheumatoid Arthritis  . Arthritis Father        Rheumatoid/Osteoarthritis and Blindness, bleeding Tendencies,Glaucoma  . CAD Father   . Hypertension Maternal Grandmother   . COPD Neg Hx     Social History Social History   Tobacco Use  . Smoking status: Current Every Day Smoker    Packs/day: 0.25    Types: Cigarettes  . Smokeless tobacco: Never Used  Substance Use Topics  . Alcohol use: Yes    Comment: wine occasional  . Drug use: No    Review of  Systems  Review of Systems  Constitutional: Positive for fatigue. Negative for fever.  HENT: Negative for congestion and sore throat.   Eyes: Negative for visual disturbance.  Respiratory: Positive for cough and shortness of breath.   Cardiovascular: Negative for chest pain.  Gastrointestinal: Positive for nausea and vomiting. Negative for abdominal pain and diarrhea.  Genitourinary: Negative for flank pain.  Musculoskeletal: Negative for back pain and neck pain.  Skin: Negative for rash and wound.  Neurological: Positive for weakness.  All other systems reviewed and are negative.    ____________________________________________  PHYSICAL EXAM:      VITAL SIGNS: ED Triage Vitals  Enc Vitals Group     BP 04/04/19 1519 134/76     Pulse Rate 04/04/19 1519 (!) 110     Resp 04/04/19 1519 18     Temp 04/04/19 1519 (!) 102.3 F (39.1 C)     Temp Source 04/04/19 1519 Oral     SpO2 04/04/19 1519 97 %     Weight --      Height --      Head Circumference --      Peak Flow --      Pain Score 04/04/19 1514 4     Pain Loc --      Pain Edu? --      Excl. in [place]? --      Physical Exam Vitals signs and nursing note reviewed.  Constitutional:      General: She is not in acute distress.    Appearance: She is well-developed.  HENT:     Head: Normocephalic and atraumatic.     Mouth/Throat:     Mouth: Mucous membranes are dry.  Eyes:     Conjunctiva/sclera: Conjunctivae normal.  Neck:     Musculoskeletal: Neck supple.  Cardiovascular:     Rate and Rhythm: Regular rhythm. Tachycardia present.     Heart sounds: Normal heart sounds. No murmur. No friction rub.  Pulmonary:     Effort: Pulmonary effort is normal. No respiratory distress.     Breath sounds: Rhonchi (right-sided) and rales (RML) present. No wheezing.  Abdominal:     General: There is no distension.     Palpations: Abdomen is soft.     Tenderness: There is no abdominal tenderness.     Comments: Non-tender, specifically  no RUQ TTP, neg Murphy's  Skin:    General: Skin is warm.     Capillary Refill: Capillary refill takes less than 2 seconds.  Neurological:     Mental Status: She is alert and oriented to person, place, and time.     Motor: No abnormal muscle tone.       ____________________________________________   LABS (all labs ordered are listed, but only abnormal results are displayed)  Labs Reviewed  COMPREHENSIVE METABOLIC PANEL -  Abnormal; Notable for the following components:      Result Value   Sodium 131 (*)    Chloride 94 (*)    Glucose, Bld 150 (*)    Creatinine, Ser 1.10 (*)    AST 59 (*)    ALT 60 (*)    GFR calc non Af Amer 49 (*)    GFR calc Af Amer 57 (*)    All other components within normal limits  CBC WITH DIFFERENTIAL/PLATELET - Abnormal; Notable for the following components:   WBC 24.6 (*)    Hemoglobin 15.7 (*)    Neutro Abs 23.2 (*)    Lymphs Abs 0.5 (*)    Abs Immature Granulocytes 0.29 (*)    All other components within normal limits  PROTIME-INR - Abnormal; Notable for the following components:   Prothrombin Time 17.0 (*)    INR 1.4 (*)    All other components within normal limits  CULTURE, BLOOD (ROUTINE X 2)  CULTURE, BLOOD (ROUTINE X 2)  URINE CULTURE  SARS CORONAVIRUS 2 (TAT 6-24 HRS)  LACTIC ACID, PLASMA  LACTIC ACID, PLASMA  URINALYSIS, COMPLETE (UACMP) WITH MICROSCOPIC    ____________________________________________  EKG: Sinus tachycardia, VR 108. PR 114, QRS 72, QTc 372. Non-specific st changes, no acute elevations or depressions. ________________________________________  RADIOLOGY All imaging, including plain films, CT scans, and ultrasounds, independently reviewed by me, and interpretations confirmed via formal radiology reads.  ED MD interpretation:   CXR: RUL PNA  Official radiology report(s): Dg Chest 2 View  Result Date: 04/04/2019 CLINICAL DATA:  Patient feels like she is going to pass out. Nauseous for 2 days. EXAM: CHEST - 2  VIEW COMPARISON:  01/19/2011 FINDINGS: There is focal consolidation in the right upper lobe that is new from the prior study. This is consistent with pneumonia. No other areas of lung consolidation. Lungs are hyperexpanded. There is linear scarring or atelectasis at the bases. No evidence of pulmonary edema. No pleural effusion or pneumothorax. Cardiac silhouette is normal in size. No mediastinal or hilar masses. No evidence of adenopathy. Skeletal structures are intact. IMPRESSION: Right upper lobe consolidation consistent with pneumonia. Electronically Signed   By: Lajean Manes M.D.   On: 04/04/2019 16:25    ____________________________________________  PROCEDURES   Procedure(s) performed (including Critical Care):  .Critical Care Performed by: Duffy Bruce, MD Authorized by: Duffy Bruce, MD   Critical care provider statement:    Critical care time (minutes):  45   Critical care time was exclusive of:  Separately billable procedures and treating other patients and teaching time   Critical care was necessary to treat or prevent imminent or life-threatening deterioration of the following conditions:  Respiratory failure, circulatory failure, cardiac failure and sepsis   Critical care was time spent personally by me on the following activities:  Development of treatment plan with patient or surrogate, discussions with consultants, evaluation of patient's response to treatment, examination of patient, obtaining history from patient or surrogate, ordering and performing treatments and interventions, ordering and review of laboratory studies, ordering and review of radiographic studies, pulse oximetry, re-evaluation of patient's condition and review of old charts   I assumed direction of critical care for this patient from another provider in my specialty: no      ____________________________________________  INITIAL IMPRESSION / MDM / [place] / ED COURSE  As part of my  medical decision making, I reviewed the following data within the [place] notes reviewed and incorporated, Old  chart reviewed, Notes from prior ED visits, and [place] Controlled Substance Database       *MABREY HOWLAND was evaluated in Emergency Department on 04/04/2019 for the symptoms described in the history of present illness. She was evaluated in the context of the global COVID-19 pandemic, which necessitated consideration that the patient might be at risk for infection with the SARS-CoV-2 virus that causes COVID-19. Institutional protocols and algorithms that pertain to the evaluation of patients at risk for COVID-19 are in a state of rapid change based on information released by regulatory bodies including the CDC and federal and state organizations. These policies and algorithms were followed during the patient's care in the ED.  Some ED evaluations and interventions may be delayed as a result of limited staffing during the pandemic.*     Medical Decision Making:  73 yo F here with sepsis 2/2 RUL PNA. No COVID exposures, CXR is more c/w focal bacterial PNA. Pt initially activated as CODE SEPSIS 2/2 fever, tachycardia. Broad spectrum ABX started initially 2/2 initial unknown source and immunosupp status. She has no RUQ or other abd TTP, suspect her n/v is 2/2 her PNA. Admit to medicine.   ____________________________________________  FINAL CLINICAL IMPRESSION(S) / ED DIAGNOSES  Final diagnoses:  Sepsis due to pneumonia ([place])  Immunosuppressed status ([place])     MEDICATIONS GIVEN DURING THIS VISIT:  Medications  acetaminophen (TYLENOL) 325 MG tablet (has no administration in time range)  sodium chloride 0.9 % bolus 1,000 mL (1,000 mLs Intravenous New Bag/Given 04/04/19 1635)    And  sodium chloride 0.9 % bolus 500 mL (has no administration in time range)  metroNIDAZOLE (FLAGYL) IVPB 500 mg (has no administration in time range)  vancomycin (VANCOCIN) IVPB 1000  mg/200 mL premix (has no administration in time range)  acetaminophen (TYLENOL) tablet 650 mg (650 mg Oral Given 04/04/19 1545)  ceFEPIme (MAXIPIME) 2 g in sodium chloride 0.9 % 100 mL IVPB (2 g Intravenous New Bag/Given 04/04/19 1633)     ED Discharge Orders    None       Note:  This document was prepared using Dragon voice recognition software and may include unintentional dictation errors.   Duffy Bruce, MD 04/04/19 1710    Duffy Bruce, MD 04/04/19 [phone]

## 2019-04-05 ENCOUNTER — Other Ambulatory Visit: Payer: Self-pay

## 2019-04-05 DIAGNOSIS — A419 Sepsis, unspecified organism: Secondary | ICD-10-CM

## 2019-04-05 DIAGNOSIS — J189 Pneumonia, unspecified organism: Secondary | ICD-10-CM

## 2019-04-05 LAB — CBC WITH DIFFERENTIAL/PLATELET
Abs Immature Granulocytes: 0.1 10*3/uL — ABNORMAL HIGH (ref 0.00–0.07)
Basophils Absolute: 0 10*3/uL (ref 0.0–0.1)
Basophils Relative: 0 %
Eosinophils Absolute: 0 10*3/uL (ref 0.0–0.5)
Eosinophils Relative: 0 %
HCT: 38.9 % (ref 36.0–46.0)
Hemoglobin: 13.6 g/dL (ref 12.0–15.0)
Immature Granulocytes: 1 %
Lymphocytes Relative: 3 %
Lymphs Abs: 0.5 10*3/uL — ABNORMAL LOW (ref 0.7–4.0)
MCH: 32.2 pg (ref 26.0–34.0)
MCHC: 35 g/dL (ref 30.0–36.0)
MCV: 92.2 fL (ref 80.0–100.0)
Monocytes Absolute: 0.4 10*3/uL (ref 0.1–1.0)
Monocytes Relative: 3 %
Neutro Abs: 14.8 10*3/uL — ABNORMAL HIGH (ref 1.7–7.7)
Neutrophils Relative %: 93 %
Platelets: 127 10*3/uL — ABNORMAL LOW (ref 150–400)
RBC: 4.22 MIL/uL (ref 3.87–5.11)
RDW: 13.1 % (ref 11.5–15.5)
Smear Review: NORMAL
WBC: 15.9 10*3/uL — ABNORMAL HIGH (ref 4.0–10.5)
nRBC: 0 % (ref 0.0–0.2)

## 2019-04-05 LAB — COMPREHENSIVE METABOLIC PANEL
ALT: 93 U/L — ABNORMAL HIGH (ref 0–44)
AST: 143 U/L — ABNORMAL HIGH (ref 15–41)
Albumin: 3 g/dL — ABNORMAL LOW (ref 3.5–5.0)
Alkaline Phosphatase: 54 U/L (ref 38–126)
Anion gap: 11 (ref 5–15)
BUN: 18 mg/dL (ref 8–23)
CO2: 21 mmol/L — ABNORMAL LOW (ref 22–32)
Calcium: 8 mg/dL — ABNORMAL LOW (ref 8.9–10.3)
Chloride: 101 mmol/L (ref 98–111)
Creatinine, Ser: 1.02 mg/dL — ABNORMAL HIGH (ref 0.44–1.00)
GFR calc Af Amer: >60 mL/min (ref >60)
GFR calc non Af Amer: 54 mL/min — ABNORMAL LOW (ref >60)
Glucose, Bld: 124 mg/dL — ABNORMAL HIGH (ref 70–99)
Potassium: 3.2 mmol/L — ABNORMAL LOW (ref 3.5–5.1)
Sodium: 133 mmol/L — ABNORMAL LOW (ref 135–145)
Total Bilirubin: 1.1 mg/dL (ref 0.3–1.2)
Total Protein: 6.7 g/dL (ref 6.5–8.1)

## 2019-04-05 LAB — MAGNESIUM: Magnesium: 2 mg/dL (ref 1.7–2.4)

## 2019-04-05 LAB — SARS CORONAVIRUS 2 (TAT 6-24 HRS): SARS Coronavirus 2: NEGATIVE

## 2019-04-05 MED ORDER — SODIUM CHLORIDE 0.9 % IV SOLN
2.0000 g | INTRAVENOUS | Status: DC
  Administered 2019-04-05 – 2019-04-08 (×4): 2 g via INTRAVENOUS
  Filled 2019-04-05 (×3): qty 2
  Filled 2019-04-05: qty 20

## 2019-04-05 MED ORDER — CALCIUM CARBONATE ANTACID 500 MG PO CHEW
500.0000 mg | CHEWABLE_TABLET | Freq: Every day | ORAL | Status: DC
  Administered 2019-04-05 – 2019-04-10 (×6): 500 mg via ORAL
  Filled 2019-04-05 (×4): qty 3
  Filled 2019-04-05: qty 1
  Filled 2019-04-05 (×2): qty 3

## 2019-04-05 MED ORDER — ROSUVASTATIN CALCIUM 20 MG PO TABS
40.0000 mg | ORAL_TABLET | Freq: Every day | ORAL | Status: DC
  Administered 2019-04-05: 40 mg via ORAL
  Filled 2019-04-05 (×2): qty 2
  Filled 2019-04-05: qty 4

## 2019-04-05 MED ORDER — HYDROCODONE-HOMATROPINE 5-1.5 MG/5ML PO SYRP
5.0000 mL | ORAL_SOLUTION | Freq: Four times a day (QID) | ORAL | Status: DC | PRN
  Administered 2019-04-05 – 2019-04-09 (×9): 5 mL via ORAL
  Filled 2019-04-05 (×14): qty 5

## 2019-04-05 MED ORDER — ACETAMINOPHEN 325 MG PO TABS
650.0000 mg | ORAL_TABLET | Freq: Four times a day (QID) | ORAL | Status: DC | PRN
  Administered 2019-04-05 – 2019-04-07 (×3): 650 mg via ORAL
  Filled 2019-04-05 (×2): qty 2

## 2019-04-05 MED ORDER — LEVOTHYROXINE SODIUM 50 MCG PO TABS
50.0000 ug | ORAL_TABLET | Freq: Every day | ORAL | Status: DC
  Administered 2019-04-05 – 2019-04-10 (×5): 50 ug via ORAL
  Filled 2019-04-05 (×6): qty 1

## 2019-04-05 MED ORDER — POTASSIUM CHLORIDE IN NACL 20-0.9 MEQ/L-% IV SOLN
INTRAVENOUS | Status: DC
Start: 2019-04-05 — End: 2019-04-06
  Administered 2019-04-05 – 2019-04-06 (×2): via INTRAVENOUS
  Filled 2019-04-05 (×3): qty 1000

## 2019-04-05 MED ORDER — ENOXAPARIN SODIUM 40 MG/0.4ML ~~LOC~~ SOLN
40.0000 mg | SUBCUTANEOUS | Status: DC
  Administered 2019-04-05 – 2019-04-08 (×4): 40 mg via SUBCUTANEOUS
  Filled 2019-04-05 (×5): qty 0.4

## 2019-04-05 MED ORDER — BENZONATATE 100 MG PO CAPS
100.0000 mg | ORAL_CAPSULE | Freq: Two times a day (BID) | ORAL | Status: DC | PRN
  Administered 2019-04-05 – 2019-04-09 (×4): 100 mg via ORAL
  Filled 2019-04-05 (×4): qty 1

## 2019-04-05 MED ORDER — SODIUM CHLORIDE 0.9 % IV SOLN
500.0000 mg | INTRAVENOUS | Status: AC
  Administered 2019-04-05 – 2019-04-07 (×3): 500 mg via INTRAVENOUS
  Filled 2019-04-05 (×2): qty 500

## 2019-04-05 NOTE — Progress Notes (Signed)
NP Ouma made aware that tele monitoring reports pt with irregular heart rate being in and out of aflutter and ST with PACs heart rate as high at 170s but not sustained, also reported that pt coughing during this episode, HR dropped back down a sustained at 108, per Ouma she will look into this momentarily

## 2019-04-05 NOTE — Progress Notes (Signed)
NP Ouma made aware that pt had episode of increase HR back to 170s, non sustained, asymptomatic and lying in bed, HR decrease back to 116, pt with no complaints, night nurse aware

## 2019-04-05 NOTE — ED Notes (Signed)
Assisted pt to bathroom

## 2019-04-05 NOTE — Progress Notes (Signed)
Report given to Warrick Parisian RN

## 2019-04-05 NOTE — ED Notes (Signed)
Provided pt with meal tray.

## 2019-04-05 NOTE — Progress Notes (Signed)
   04/05/19 1800  Clinical Encounter Type  Visited With Patient  Visit Type Initial;Spiritual support  Referral From Nurse  Consult/Referral To Chaplain  Spiritual Encounters  Spiritual Needs Brochure;Prayer;Emotional  Stress Factors  Patient Stress Factors Financial concerns;Health changes  Chaplain received OR for AD and visit patient. Patient was lying bed resting. Chaplain introduce herself and ask patient how she was doing and patient explained her stay here has been in the ER for two days until room was available. Patient also express the pain that has been  Having in her chest, but the doctor gave her something that she believes is working for her now. Patient express to Chaplain her stressors with health and financial situations. Chaplain presented active listening and empathy. Chaplain offered encouraging words and prayer and patient was expressive through thankfulness and tears. Chaplain left documentation with patient to read over and patient will complete at her timing.

## 2019-04-05 NOTE — Progress Notes (Signed)
  BRIEF OVERNIGHT REPORT  SUBJECTIVE: Patient is going in and out of irregular rhythm up to 170s non-sustained per primary nurse.  OBJECTIVE: On arrival to the bedside, she was afebrile with blood pressure 140/53 mm Hg and pulse rate 114 beats/min. There were no focal neurological deficits; she was alert and oriented x4, and denies SOB, chest pain or palpitation.  ASSESSMENT: 73 y.o female with past medical hx of thrombocytopenia, hypothyroidism, RA, on Humira and recent steroid use, HLD, recurrent pneumonias, anxiety, and glaucoma admitted with sepsis secondary to pneumonia.  PLAN:  1. Irregular heart rhythm -  Elevated HR up to 170S asymptomatic. No evidence of hemodynamic instability. Concerns for afib with rvr, patient also on Azithromycin which can cause arrythmias - Will check EKG - Metoprolol IV prn - Check BMP+Mag and replace   Rufina Falco, DNP, CCRN, FNP-C [facility] Nurse Practitioner Between 7pm to [place] - Pager [phone]   After 7am go to [email or web address] - [email or web address] [facility]  [facility] [employer]  [phone]

## 2019-04-05 NOTE — ED Notes (Signed)
NAD. Pt informed of bed on floor. Unlabored.

## 2019-04-05 NOTE — Progress Notes (Addendum)
Progress Note    Tamara Whitehead  YKD:[id number] DOB: June 29, 1945  DOA: 04/04/2019 PCP: Leone Haven, MD      Brief Narrative:    Medical records reviewed and are as summarized below:  Tamara Whitehead is an 73 y.o. female with history of rheumatoid arthritis on Humira, osteoarthritis, recent use of prednisone, hypercholesterolemia, hypothyroidism,.  She presented to the hospital with vomiting, dizziness, unsteady gait, productive cough, pleuritic pain between the shoulder blades and exertional shortness of breath.  She was found to have sepsis secondary to right upper lobe pneumonia.      Assessment/Plan:   Active Problems:   Community acquired pneumonia   Hyponatremia   Nausea and vomiting   Dehydration     Sepsis secondary to community-acquired pneumonia in an immunocompromised patient: Continue IV Rocephin and Zithromax.  Follow-up blood cultures.  Coronavirus test was negative.  Leukocytosis is improving.  Hyponatremia: Treat with IV fluids  Hypokalemia: Replete potassium intravenously and IV fluids  Dehydration/mild elevated creatinine: Hydrate with IV fluids.  Nausea and vomiting: Resolved  Elevated liver enzymes: Liver enzymes trending up.  Etiology unclear.  Continue to monitor liver enzymes.    Family Communication/Anticipated D/C date and plan/Code Status   DVT prophylaxis: Lovenox Code Status: Full code Family Communication: Plan discussed with the patient Disposition Plan: Home in 1 to 2 days      Subjective:   C/o severe cough with pleuritic chest pain. She also feels short of breath with little exertion.  No vomiting or diarrhea.  Objective:    Vitals:   04/05/19 0805 04/05/19 0939 04/05/19 1247 04/05/19 1458  BP: (!) 116/55 (!) 122/55 (!) 115/94 117/61  Pulse: 88 78 85 92  Resp: (!) 22 20 16 17   Temp: 98.7 F (37.1 C)  99.3 F (37.4 C)   TempSrc: Oral  Oral   SpO2: 93% 93% 95% 93%    Intake/Output Summary (Last 24  hours) at 04/05/2019 1551 Last data filed at 04/05/2019 1132 Gross per 24 hour  Intake 2490 ml  Output -  Net 2490 ml   There were no vitals filed for this visit.  Exam:  GEN: NAD SKIN: No rash EYES: EOMI ENT: MMM CV: RRR PULM: CTA B ABD: soft, ND, NT, +BS CNS: AAO x 3, non focal EXT: No edema or tenderness   Data Reviewed:   I have personally reviewed following labs and imaging studies:  Labs: Labs show the following:   Basic Metabolic Panel: Recent Labs  Lab 04/04/19 1530 04/05/19 0840  NA 131* 133*  K 4.0 3.2*  CL 94* 101  CO2 23 21*  GLUCOSE 150* 124*  BUN 14 18  CREATININE 1.10* 1.02*  CALCIUM 9.3 8.0*   GFR Estimated Creatinine Clearance: 35.4 mL/min (A) (by C-G formula based on SCr of 1.02 mg/dL (H)). Liver Function Tests: Recent Labs  Lab 04/04/19 1530 04/05/19 0840  AST 59* 143*  ALT 60* 93*  ALKPHOS 70 54  BILITOT 1.2 1.1  PROT 7.8 6.7  ALBUMIN 3.8 3.0*   No results for input(s): LIPASE, AMYLASE in the last 168 hours. No results for input(s): AMMONIA in the last 168 hours. Coagulation profile Recent Labs  Lab 04/04/19 1530  INR 1.4*    CBC: Recent Labs  Lab 04/04/19 1530 04/05/19 0645  WBC 24.6* 15.9*  NEUTROABS 23.2* 14.8*  HGB 15.7* 13.6  HCT 44.7 38.9  MCV 92.5 92.2  PLT 156 127*   Cardiac Enzymes: No results for  input(s): CKTOTAL, CKMB, CKMBINDEX, TROPONINI in the last 168 hours. BNP (last 3 results) No results for input(s): PROBNP in the last 8760 hours. CBG: No results for input(s): GLUCAP in the last 168 hours. D-Dimer: No results for input(s): DDIMER in the last 72 hours. Hgb A1c: No results for input(s): HGBA1C in the last 72 hours. Lipid Profile: No results for input(s): CHOL, HDL, LDLCALC, TRIG, CHOLHDL, LDLDIRECT in the last 72 hours. Thyroid function studies: No results for input(s): TSH, T4TOTAL, T3FREE, THYROIDAB in the last 72 hours.  Invalid input(s): FREET3 Anemia work up: No results for  input(s): VITAMINB12, FOLATE, FERRITIN, TIBC, IRON, RETICCTPCT in the last 72 hours. Sepsis Labs: Recent Labs  Lab 04/04/19 1530 04/05/19 0645  WBC 24.6* 15.9*  LATICACIDVEN 1.8  --     Microbiology Recent Results (from the past 240 hour(s))  Culture, blood (Routine x 2)     Status: None (Preliminary result)   Collection Time: 04/04/19  3:28 PM   Specimen: BLOOD  Result Value Ref Range Status   Specimen Description BLOOD RIGHT ANTECUBITAL  Final   Special Requests   Final    BOTTLES DRAWN AEROBIC AND ANAEROBIC Blood Culture adequate volume   Culture   Final    NO GROWTH < 24 HOURS Performed at [facility], [address], [place], [place] [address]    Report Status PENDING  Incomplete  Culture, blood (Routine x 2)     Status: None (Preliminary result)   Collection Time: 04/04/19  3:31 PM   Specimen: BLOOD  Result Value Ref Range Status   Specimen Description BLOOD LEFT ANTECUBITAL  Final   Special Requests   Final    BOTTLES DRAWN AEROBIC AND ANAEROBIC Blood Culture results may not be optimal due to an excessive volume of blood received in culture bottles   Culture   Final    NO GROWTH < 24 HOURS Performed at [facility], [address] [place], [place] [address]    Report Status PENDING  Incomplete  SARS CORONAVIRUS 2 (TAT 6-24 HRS) Nasopharyngeal Nasopharyngeal Swab     Status: None   Collection Time: 04/04/19  4:53 PM   Specimen: Nasopharyngeal Swab  Result Value Ref Range Status   SARS Coronavirus 2 NEGATIVE NEGATIVE Final    Comment: (NOTE) SARS-CoV-2 target nucleic acids are NOT DETECTED. The SARS-CoV-2 RNA is generally detectable in upper and lower respiratory specimens during the acute phase of infection. Negative results do not preclude SARS-CoV-2 infection, do not rule out co-infections with other pathogens, and should not be used as the sole basis for treatment or other patient management decisions. Negative results must be combined  with clinical observations, patient history, and epidemiological information. The expected result is Negative. Fact Sheet for Patients: SugarRoll.be Fact Sheet for Healthcare Providers: https://www.woods-mathews.com/ This test is not yet approved or cleared by the [place] FDA and  has been authorized for detection and/or diagnosis of SARS-CoV-2 by FDA under an Emergency Use Authorization (EUA). This EUA will remain  in effect (meaning this test can be used) for the duration of the COVID-19 declaration under Section 56 4(b)(1) of the Act, 21 U.S.C. section 360bbb-3(b)(1), unless the authorization is terminated or revoked sooner. Performed at [facility], [address] [place], [place] [address]     Procedures and diagnostic studies:  Dg Chest 2 View  Result Date: 04/04/2019 CLINICAL DATA:  Patient feels like she is going to pass out. Nauseous for 2 days. EXAM: CHEST -  2 VIEW COMPARISON:  01/19/2011 FINDINGS: There is focal consolidation in the right upper lobe that is new from the prior study. This is consistent with pneumonia. No other areas of lung consolidation. Lungs are hyperexpanded. There is linear scarring or atelectasis at the bases. No evidence of pulmonary edema. No pleural effusion or pneumothorax. Cardiac silhouette is normal in size. No mediastinal or hilar masses. No evidence of adenopathy. Skeletal structures are intact. IMPRESSION: Right upper lobe consolidation consistent with pneumonia. Electronically Signed   By: Lajean Manes M.D.   On: 04/04/2019 16:25    Medications:   . calcium carbonate  500 mg Oral Daily  . enoxaparin (LOVENOX) injection  40 mg Subcutaneous Q24H  . levothyroxine  50 mcg Oral QAC breakfast  . rosuvastatin  40 mg Oral q1800   Continuous Infusions: . 0.9 % NaCl with KCl 20 mEq / L    . azithromycin Stopped (04/05/19 1132)  . cefTRIAXone (ROCEPHIN)  IV Stopped (04/05/19 1132)     LOS:  1 day   Sharyl Panchal  [facility] Pager [phone].   *Please refer to [email or web address], password [email or web address] to get updated schedule on who will round on this patient, as hospitalists switch teams weekly. If 7PM-7AM, please contact night-coverage at [email or web address], password [email or web address] for any overnight needs.  04/05/2019, 3:51 PM

## 2019-04-05 NOTE — ED Notes (Signed)
Pt transferred to [place] pending room assignment.

## 2019-04-06 DIAGNOSIS — R748 Abnormal levels of other serum enzymes: Secondary | ICD-10-CM

## 2019-04-06 LAB — CBC WITH DIFFERENTIAL/PLATELET
Abs Immature Granulocytes: 0.11 10*3/uL — ABNORMAL HIGH (ref 0.00–0.07)
Basophils Absolute: 0.1 10*3/uL (ref 0.0–0.1)
Basophils Relative: 0 %
Eosinophils Absolute: 0 10*3/uL (ref 0.0–0.5)
Eosinophils Relative: 0 %
HCT: 44.6 % (ref 36.0–46.0)
Hemoglobin: 15.2 g/dL — ABNORMAL HIGH (ref 12.0–15.0)
Immature Granulocytes: 1 %
Lymphocytes Relative: 4 %
Lymphs Abs: 0.6 10*3/uL — ABNORMAL LOW (ref 0.7–4.0)
MCH: 31.9 pg (ref 26.0–34.0)
MCHC: 34.1 g/dL (ref 30.0–36.0)
MCV: 93.5 fL (ref 80.0–100.0)
Monocytes Absolute: 0.3 10*3/uL (ref 0.1–1.0)
Monocytes Relative: 2 %
Neutro Abs: 12.7 10*3/uL — ABNORMAL HIGH (ref 1.7–7.7)
Neutrophils Relative %: 93 %
Platelets: 130 10*3/uL — ABNORMAL LOW (ref 150–400)
RBC: 4.77 MIL/uL (ref 3.87–5.11)
RDW: 13.2 % (ref 11.5–15.5)
Smear Review: NORMAL
WBC: 13.8 10*3/uL — ABNORMAL HIGH (ref 4.0–10.5)
nRBC: 0 % (ref 0.0–0.2)

## 2019-04-06 LAB — COMPREHENSIVE METABOLIC PANEL
ALT: 110 U/L — ABNORMAL HIGH (ref 0–44)
AST: 167 U/L — ABNORMAL HIGH (ref 15–41)
Albumin: 3 g/dL — ABNORMAL LOW (ref 3.5–5.0)
Alkaline Phosphatase: 61 U/L (ref 38–126)
Anion gap: 12 (ref 5–15)
BUN: 15 mg/dL (ref 8–23)
CO2: 19 mmol/L — ABNORMAL LOW (ref 22–32)
Calcium: 8.2 mg/dL — ABNORMAL LOW (ref 8.9–10.3)
Chloride: 101 mmol/L (ref 98–111)
Creatinine, Ser: 0.83 mg/dL (ref 0.44–1.00)
GFR calc Af Amer: >60 mL/min (ref >60)
GFR calc non Af Amer: >60 mL/min (ref >60)
Glucose, Bld: 90 mg/dL (ref 70–99)
Potassium: 3.7 mmol/L (ref 3.5–5.1)
Sodium: 132 mmol/L — ABNORMAL LOW (ref 135–145)
Total Bilirubin: 0.8 mg/dL (ref 0.3–1.2)
Total Protein: 7.1 g/dL (ref 6.5–8.1)

## 2019-04-06 LAB — URINE CULTURE
Culture: <10000 — AB
Special Requests: NORMAL

## 2019-04-06 MED ORDER — LABETALOL HCL 5 MG/ML IV SOLN
10.0000 mg | INTRAVENOUS | Status: DC | PRN
  Administered 2019-04-06 – 2019-04-08 (×6): 10 mg via INTRAVENOUS
  Filled 2019-04-06 (×6): qty 4

## 2019-04-06 NOTE — Progress Notes (Signed)
Patient has had multiple episodes of high heart rate throughout the day.  MD is aware and a cardiology consult has been placed.

## 2019-04-06 NOTE — TOC Progression Note (Signed)
Transition of Care [facility]) - Progression Note    Patient Details  Name: Tamara Whitehead MRN: [id number] Date of Birth: [birth year]  Transition of Care [facility]) CM/SW Contact  Katrina Stack, RN Phone Number: 04/06/2019, 3:07 PM  Clinical Narrative:   Cardiology consult for elevated heart rate. Low risk score. Room air Independent in all adls, denies issues accessing medical care, obtaining medications or with transportation.  Current with PCP.  No discharge needs identified at present by care manager or members of care team        Expected Discharge Plan and Services                                                 Social Determinants of Health (SDOH) Interventions    Readmission Risk Interventions No flowsheet data found.

## 2019-04-06 NOTE — Progress Notes (Signed)
Progress Note    Tamara Whitehead  BJY:[id number] DOB: 07/22/1945  DOA: 04/04/2019 PCP: Leone Haven, MD      Brief Narrative:    Medical records reviewed and are as summarized below:  Tamara Whitehead is an 73 y.o. female with history of rheumatoid arthritis on Humira, osteoarthritis, recent use of prednisone, hypercholesterolemia, hypothyroidism,.  She presented to the hospital with vomiting, dizziness, unsteady gait, productive cough, pleuritic pain between the shoulder blades and exertional shortness of breath.  She was found to have sepsis secondary to right upper lobe pneumonia.      Assessment/Plan:   Active Problems:   Community acquired pneumonia   Hyponatremia   Nausea and vomiting   Dehydration   Sepsis due to pneumonia ([place])     Sepsis secondary to community-acquired pneumonia in an immunocompromised patient: No growth on blood cultures thus far.  Leukocytosis is improving.  Intermittent, transient arrhythmia on telemetry: This appears to be sinus tachycardia with APCs, ?atrial flutter,  and it looks like it occurs when she has coughing spells.  Continue to monitor on telemetry.  Mild hyponatremia: Asymptomatic and stable.  Discontinue IV fluids  Acute hypoxemic respiratory failure: Oxygen saturation dropped to 86% on room air at some point.  She is tolerating room air for now.  Continue to monitor.  Hypokalemia: Improved.  Dehydration/mild elevated creatinine: Improved.  Discontinued IV fluids.  Nausea and vomiting: Resolved  Elevated liver enzymes: Liver enzymes trending up.  She said she was told she had fatty liver but she "cleared it up".  Order ultrasound for further evaluation.   Family Communication/Anticipated D/C date and plan/Code Status   DVT prophylaxis: Lovenox Code Status: Full code Family Communication: Plan discussed with the patient Disposition Plan: Home in 1 to 2 days      Subjective:   She feels better.  She thinks  the Hycodan syrup was helpful.  However, she continues to have intermittent coughing spells.  She feels a little short of breath when she has her coughing spells.  She also has palpitations when she has her coughing spells.  Objective:    Vitals:   04/05/19 2024 04/05/19 2232 04/06/19 0404 04/06/19 0815  BP: (!) 140/53 (!) 143/63 (!) 164/65 137/60  Pulse: 95 95 (!) 109 95  Resp: (!) 28 (!) 28 18 17   Temp: 99.5 F (37.5 C) 99.8 F (37.7 C) 99.8 F (37.7 C) 99.8 F (37.7 C)  TempSrc: Oral Oral Oral Oral  SpO2: 93% 90% (!) 88% (!) 86%  Weight:      Height:        Intake/Output Summary (Last 24 hours) at 04/06/2019 1325 Last data filed at 04/06/2019 0400 Gross per 24 hour  Intake 823.74 ml  Output -  Net 823.74 ml   Filed Weights   04/05/19 1628  Weight: 47.8 kg    Exam:  GEN: NAD SKIN: No rash EYES: no pallor or icterus ENT: MMM CV: RRR PULM: CTA B ABD: soft, ND, NT, +BS CNS: AAO x 3, non focal EXT: No edema or tenderness    Data Reviewed:   I have personally reviewed following labs and imaging studies:  Labs: Labs show the following:   Basic Metabolic Panel: Recent Labs  Lab 04/04/19 1530 04/05/19 0840 04/05/19 1601 04/06/19 0427  NA 131* 133*  --  132*  K 4.0 3.2*  --  3.7  CL 94* 101  --  101  CO2 23 21*  --  19*  GLUCOSE 150* 124*  --  90  BUN 14 18  --  15  CREATININE 1.10* 1.02*  --  0.83  CALCIUM 9.3 8.0*  --  8.2*  MG  --   --  2.0  --    GFR Estimated Creatinine Clearance: 44.9 mL/min (by C-G formula based on SCr of 0.83 mg/dL). Liver Function Tests: Recent Labs  Lab 04/04/19 1530 04/05/19 0840 04/06/19 0427  AST 59* 143* 167*  ALT 60* 93* 110*  ALKPHOS 70 54 61  BILITOT 1.2 1.1 0.8  PROT 7.8 6.7 7.1  ALBUMIN 3.8 3.0* 3.0*   No results for input(s): LIPASE, AMYLASE in the last 168 hours. No results for input(s): AMMONIA in the last 168 hours. Coagulation profile Recent Labs  Lab 04/04/19 1530  INR 1.4*    CBC:  Recent Labs  Lab 04/04/19 1530 04/05/19 0645 04/06/19 0427  WBC 24.6* 15.9* 13.8*  NEUTROABS 23.2* 14.8* 12.7*  HGB 15.7* 13.6 15.2*  HCT 44.7 38.9 44.6  MCV 92.5 92.2 93.5  PLT 156 127* 130*   Cardiac Enzymes: No results for input(s): CKTOTAL, CKMB, CKMBINDEX, TROPONINI in the last 168 hours. BNP (last 3 results) No results for input(s): PROBNP in the last 8760 hours. CBG: No results for input(s): GLUCAP in the last 168 hours. D-Dimer: No results for input(s): DDIMER in the last 72 hours. Hgb A1c: No results for input(s): HGBA1C in the last 72 hours. Lipid Profile: No results for input(s): CHOL, HDL, LDLCALC, TRIG, CHOLHDL, LDLDIRECT in the last 72 hours. Thyroid function studies: No results for input(s): TSH, T4TOTAL, T3FREE, THYROIDAB in the last 72 hours.  Invalid input(s): FREET3 Anemia work up: No results for input(s): VITAMINB12, FOLATE, FERRITIN, TIBC, IRON, RETICCTPCT in the last 72 hours. Sepsis Labs: Recent Labs  Lab 04/04/19 1530 04/05/19 0645 04/06/19 0427  WBC 24.6* 15.9* 13.8*  LATICACIDVEN 1.8  --   --     Microbiology Recent Results (from the past 240 hour(s))  Culture, blood (Routine x 2)     Status: None (Preliminary result)   Collection Time: 04/04/19  3:28 PM   Specimen: BLOOD  Result Value Ref Range Status   Specimen Description BLOOD RIGHT ANTECUBITAL  Final   Special Requests   Final    BOTTLES DRAWN AEROBIC AND ANAEROBIC Blood Culture adequate volume   Culture   Final    NO GROWTH 2 DAYS Performed at [facility], [address] [place], [place] [address]    Report Status PENDING  Incomplete  Culture, blood (Routine x 2)     Status: None (Preliminary result)   Collection Time: 04/04/19  3:31 PM   Specimen: BLOOD  Result Value Ref Range Status   Specimen Description BLOOD LEFT ANTECUBITAL  Final   Special Requests   Final    BOTTLES DRAWN AEROBIC AND ANAEROBIC Blood Culture results may not be optimal due to an excessive  volume of blood received in culture bottles   Culture   Final    NO GROWTH 2 DAYS Performed at [facility], [address] [place], [place] [address]    Report Status PENDING  Incomplete  SARS CORONAVIRUS 2 (TAT 6-24 HRS) Nasopharyngeal Nasopharyngeal Swab     Status: None   Collection Time: 04/04/19  4:53 PM   Specimen: Nasopharyngeal Swab  Result Value Ref Range Status   SARS Coronavirus 2 NEGATIVE NEGATIVE Final    Comment: (NOTE) SARS-CoV-2 target nucleic acids are NOT DETECTED. The SARS-CoV-2 RNA is generally  detectable in upper and lower respiratory specimens during the acute phase of infection. Negative results do not preclude SARS-CoV-2 infection, do not rule out co-infections with other pathogens, and should not be used as the sole basis for treatment or other patient management decisions. Negative results must be combined with clinical observations, patient history, and epidemiological information. The expected result is Negative. Fact Sheet for Patients: SugarRoll.be Fact Sheet for Healthcare Providers: https://www.woods-mathews.com/ This test is not yet approved or cleared by the [place] FDA and  has been authorized for detection and/or diagnosis of SARS-CoV-2 by FDA under an Emergency Use Authorization (EUA). This EUA will remain  in effect (meaning this test can be used) for the duration of the COVID-19 declaration under Section 56 4(b)(1) of the Act, 21 U.S.C. section 360bbb-3(b)(1), unless the authorization is terminated or revoked sooner. Performed at [facility], [place] [address] [place], [place] [address]   Urine culture     Status: Abnormal   Collection Time: 04/04/19  7:00 PM   Specimen: Urine, Clean Catch  Result Value Ref Range Status   Specimen Description   Final    URINE, CLEAN CATCH Performed at [facility], [address], [place], [place] [address]    Special Requests    Final    Normal Performed at [facility], [address] [place], [place] [address]    Culture (A)  Final    <10,000 COLONIES/mL INSIGNIFICANT GROWTH Performed at [facility], [place] [address], [place],  [address]    Report Status 04/06/2019 FINAL  Final    Procedures and diagnostic studies:  Dg Chest 2 View  Result Date: 04/04/2019 CLINICAL DATA:  Patient feels like she is going to pass out. Nauseous for 2 days. EXAM: CHEST - 2 VIEW COMPARISON:  01/19/2011 FINDINGS: There is focal consolidation in the right upper lobe that is new from the prior study. This is consistent with pneumonia. No other areas of lung consolidation. Lungs are hyperexpanded. There is linear scarring or atelectasis at the bases. No evidence of pulmonary edema. No pleural effusion or pneumothorax. Cardiac silhouette is normal in size. No mediastinal or hilar masses. No evidence of adenopathy. Skeletal structures are intact. IMPRESSION: Right upper lobe consolidation consistent with pneumonia. Electronically Signed   By: Lajean Manes M.D.   On: 04/04/2019 16:25    Medications:   . calcium carbonate  500 mg Oral Daily  . enoxaparin (LOVENOX) injection  40 mg Subcutaneous Q24H  . levothyroxine  50 mcg Oral QAC breakfast  . rosuvastatin  40 mg Oral q1800   Continuous Infusions: . 0.9 % NaCl with KCl 20 mEq / L 75 mL/hr at 04/06/19 0703  . azithromycin 500 mg (04/06/19 0559)  . cefTRIAXone (ROCEPHIN)  IV 2 g (04/06/19 0600)     LOS: 2 days   Garon Melander  [facility] Pager [phone].   *Please refer to [email or web address], password [email or web address] to get updated schedule on who will round on this patient, as hospitalists switch teams weekly. If 7PM-7AM, please contact night-coverage at [email or web address], password [email or web address] for any overnight needs.  04/06/2019, 1:25 PM

## 2019-04-07 ENCOUNTER — Inpatient Hospital Stay
Admit: 2019-04-07 | Discharge: 2019-04-07 | Disposition: A | Discharge status: Home / Self Care | Payer: Medicare HMO | Attending: Nurse Practitioner | Admitting: Nurse Practitioner

## 2019-04-07 ENCOUNTER — Inpatient Hospital Stay: Payer: Medicare HMO

## 2019-04-07 DIAGNOSIS — I4892 Unspecified atrial flutter: Secondary | ICD-10-CM

## 2019-04-07 LAB — COMPREHENSIVE METABOLIC PANEL
ALT: 111 U/L — ABNORMAL HIGH (ref 0–44)
AST: 210 U/L — ABNORMAL HIGH (ref 15–41)
Albumin: 2.3 g/dL — ABNORMAL LOW (ref 3.5–5.0)
Alkaline Phosphatase: 55 U/L (ref 38–126)
Anion gap: 8 (ref 5–15)
BUN: 14 mg/dL (ref 8–23)
CO2: 24 mmol/L (ref 22–32)
Calcium: 7.9 mg/dL — ABNORMAL LOW (ref 8.9–10.3)
Chloride: 99 mmol/L (ref 98–111)
Creatinine, Ser: 0.76 mg/dL (ref 0.44–1.00)
GFR calc Af Amer: >60 mL/min (ref >60)
GFR calc non Af Amer: >60 mL/min (ref >60)
Glucose, Bld: 92 mg/dL (ref 70–99)
Potassium: 3.7 mmol/L (ref 3.5–5.1)
Sodium: 131 mmol/L — ABNORMAL LOW (ref 135–145)
Total Bilirubin: 0.8 mg/dL (ref 0.3–1.2)
Total Protein: 5.5 g/dL — ABNORMAL LOW (ref 6.5–8.1)

## 2019-04-07 LAB — ECHOCARDIOGRAM COMPLETE
Height: 61.5 in
Weight: 1686.08 oz

## 2019-04-07 LAB — TSH: TSH: 1.853 u[IU]/mL (ref 0.350–4.500)

## 2019-04-07 MED ORDER — SODIUM CHLORIDE 0.9 % IV SOLN
INTRAVENOUS | Status: DC | PRN
  Administered 2019-04-07: 250 mL via INTRAVENOUS
  Administered 2019-04-08 – 2019-04-09 (×2): 20 mL via INTRAVENOUS

## 2019-04-07 MED ORDER — ENSURE ENLIVE PO LIQD
237.0000 mL | Freq: Two times a day (BID) | ORAL | Status: DC
  Administered 2019-04-08 – 2019-04-10 (×4): 237 mL via ORAL

## 2019-04-07 MED ORDER — POTASSIUM CHLORIDE CRYS ER 20 MEQ PO TBCR
40.0000 meq | EXTENDED_RELEASE_TABLET | Freq: Once | ORAL | Status: AC
  Administered 2019-04-07: 40 meq via ORAL
  Filled 2019-04-07: qty 2

## 2019-04-07 NOTE — Progress Notes (Addendum)
Progress Note    Tamara Whitehead  ERD:[id number] DOB: 1946-01-05  DOA: 04/04/2019 PCP: Leone Haven, MD      Brief Narrative:    Medical records reviewed and are as summarized below:  Tamara Whitehead is an 73 y.o. female with history of rheumatoid arthritis on Humira, osteoarthritis, recent use of prednisone, hypercholesterolemia, hypothyroidism,.  She presented to the hospital with vomiting, dizziness, unsteady gait, productive cough, pleuritic pain between the shoulder blades and exertional shortness of breath.  She was found to have sepsis secondary to right upper lobe pneumonia.      Assessment/Plan:   Active Problems:   Community acquired pneumonia   Hyponatremia   Nausea and vomiting   Dehydration   Sepsis due to pneumonia [facility])   Atrial flutter with rapid ventricular response ([facility])     Sepsis secondary to community-acquired pneumonia in an immunocompromised patient: Leukocytosis has improved.  No growth on blood cultures.  Atrial flutter with rapid ventricular response/sinus tachycardia: Patient has been evaluated by cardiologist.  No anticoagulation recommended by cardiologist since this was transient but cardiologist will continue to follow the patient.    Mild hyponatremia: Asymptomatic and stable.   Acute hypoxemic respiratory failure: Improved.  She is tolerating room air.  Hypokalemia: We will aim for potassium of 4 or more.  Replete potassium.  Dehydration/mild elevated creatinine: Improved  Nausea and vomiting: Resolved  Elevated liver enzymes: Enzymes are still trending up.  Liver ultrasound is pending.  Cardiologist recommended discontinuation of rosuvastatin.   Family Communication/Anticipated D/C date and plan/Code Status   DVT prophylaxis: Lovenox Code Status: Full code Family Communication: Plan discussed with the patient Disposition Plan: Home in 1 to 2 days depending on clinical improvement      Subjective:   Overnight  events noted.  She still had arrhythmia on telemetry (sinus tachycardia and atrial flutter with RVR).  She still has some shortness of breath and cough but no chest pain.    Objective:    Vitals:   04/06/19 2112 04/07/19 0042 04/07/19 0300 04/07/19 0504  BP: (!) 141/87 125/76  (!) 153/71  Pulse: (!) 152 (!) 133 93 92  Resp:      Temp:    (!) 100.4 F (38 C)  TempSrc:    Oral  SpO2:    100%  Weight:      Height:        Intake/Output Summary (Last 24 hours) at 04/07/2019 1245 Last data filed at 04/07/2019 1017 Gross per 24 hour  Intake 1236.7 ml  Output -  Net 1236.7 ml   Filed Weights   04/05/19 1628  Weight: 47.8 kg    Exam:   GEN: NAD SKIN: No rash EYES: EOMI ENT: MMM CV: tachycardic, irregular rate and rhythm PULM: Rales heard on right middle zone area, no wheezing ABD: soft, ND, NT, +BS CNS: AAO x 3, non focal EXT: No edema or tenderness    Data Reviewed:   I have personally reviewed following labs and imaging studies:  Labs: Labs show the following:   Basic Metabolic Panel: Recent Labs  Lab 04/04/19 1530 04/05/19 0840 04/05/19 1601 04/06/19 0427 04/07/19 0405  NA 131* 133*  --  132* 131*  K 4.0 3.2*  --  3.7 3.7  CL 94* 101  --  101 99  CO2 23 21*  --  19* 24  GLUCOSE 150* 124*  --  90 92  BUN 14 18  --  15 14  CREATININE 1.10* 1.02*  --  0.83 0.76  CALCIUM 9.3 8.0*  --  8.2* 7.9*  MG  --   --  2.0  --   --    GFR Estimated Creatinine Clearance: 46.6 mL/min (by C-G formula based on SCr of 0.76 mg/dL). Liver Function Tests: Recent Labs  Lab 04/04/19 1530 04/05/19 0840 04/06/19 0427 04/07/19 0405  AST 59* 143* 167* 210*  ALT 60* 93* 110* 111*  ALKPHOS 70 54 61 55  BILITOT 1.2 1.1 0.8 0.8  PROT 7.8 6.7 7.1 5.5*  ALBUMIN 3.8 3.0* 3.0* 2.3*   No results for input(s): LIPASE, AMYLASE in the last 168 hours. No results for input(s): AMMONIA in the last 168 hours. Coagulation profile Recent Labs  Lab 04/04/19 1530  INR 1.4*     CBC: Recent Labs  Lab 04/04/19 1530 04/05/19 0645 04/06/19 0427  WBC 24.6* 15.9* 13.8*  NEUTROABS 23.2* 14.8* 12.7*  HGB 15.7* 13.6 15.2*  HCT 44.7 38.9 44.6  MCV 92.5 92.2 93.5  PLT 156 127* 130*   Cardiac Enzymes: No results for input(s): CKTOTAL, CKMB, CKMBINDEX, TROPONINI in the last 168 hours. BNP (last 3 results) No results for input(s): PROBNP in the last 8760 hours. CBG: No results for input(s): GLUCAP in the last 168 hours. D-Dimer: No results for input(s): DDIMER in the last 72 hours. Hgb A1c: No results for input(s): HGBA1C in the last 72 hours. Lipid Profile: No results for input(s): CHOL, HDL, LDLCALC, TRIG, CHOLHDL, LDLDIRECT in the last 72 hours. Thyroid function studies: Recent Labs    04/07/19 0405  TSH 1.853   Anemia work up: No results for input(s): VITAMINB12, FOLATE, FERRITIN, TIBC, IRON, RETICCTPCT in the last 72 hours. Sepsis Labs: Recent Labs  Lab 04/04/19 1530 04/05/19 0645 04/06/19 0427  WBC 24.6* 15.9* 13.8*  LATICACIDVEN 1.8  --   --     Microbiology Recent Results (from the past 240 hour(s))  Culture, blood (Routine x 2)     Status: None (Preliminary result)   Collection Time: 04/04/19  3:28 PM   Specimen: BLOOD  Result Value Ref Range Status   Specimen Description BLOOD RIGHT ANTECUBITAL  Final   Special Requests   Final    BOTTLES DRAWN AEROBIC AND ANAEROBIC Blood Culture adequate volume   Culture   Final    NO GROWTH 2 DAYS Performed at [facility], [address], [place], [place] [address]    Report Status PENDING  Incomplete  Culture, blood (Routine x 2)     Status: None (Preliminary result)   Collection Time: 04/04/19  3:31 PM   Specimen: BLOOD  Result Value Ref Range Status   Specimen Description BLOOD LEFT ANTECUBITAL  Final   Special Requests   Final    BOTTLES DRAWN AEROBIC AND ANAEROBIC Blood Culture results may not be optimal due to an excessive volume of blood received in culture bottles   Culture    Final    NO GROWTH 2 DAYS Performed at [facility], [address] [place], [place] [address]    Report Status PENDING  Incomplete  SARS CORONAVIRUS 2 (TAT 6-24 HRS) Nasopharyngeal Nasopharyngeal Swab     Status: None   Collection Time: 04/04/19  4:53 PM   Specimen: Nasopharyngeal Swab  Result Value Ref Range Status   SARS Coronavirus 2 NEGATIVE NEGATIVE Final    Comment: (NOTE) SARS-CoV-2 target nucleic acids are NOT DETECTED. The SARS-CoV-2 RNA is generally detectable in upper and lower respiratory specimens during  the acute phase of infection. Negative results do not preclude SARS-CoV-2 infection, do not rule out co-infections with other pathogens, and should not be used as the sole basis for treatment or other patient management decisions. Negative results must be combined with clinical observations, patient history, and epidemiological information. The expected result is Negative. Fact Sheet for Patients: SugarRoll.be Fact Sheet for Healthcare Providers: https://www.woods-mathews.com/ This test is not yet approved or cleared by the [place] FDA and  has been authorized for detection and/or diagnosis of SARS-CoV-2 by FDA under an Emergency Use Authorization (EUA). This EUA will remain  in effect (meaning this test can be used) for the duration of the COVID-19 declaration under Section 56 4(b)(1) of the Act, 21 U.S.C. section 360bbb-3(b)(1), unless the authorization is terminated or revoked sooner. Performed at [facility], [place] [address], [place], [place] [address]   Urine culture     Status: Abnormal   Collection Time: 04/04/19  7:00 PM   Specimen: Urine, Clean Catch  Result Value Ref Range Status   Specimen Description   Final    URINE, CLEAN CATCH Performed at [facility], [address], [place], [place] [address]    Special Requests   Final    Normal Performed at [facility],  [address] [place], [place] [address]    Culture (A)  Final    <10,000 COLONIES/mL INSIGNIFICANT GROWTH Performed at [facility], [place] [address], [place], [place] [address]    Report Status 04/06/2019 FINAL  Final    Procedures and diagnostic studies:  No results found.  Medications:   . calcium carbonate  500 mg Oral Daily  . enoxaparin (LOVENOX) injection  40 mg Subcutaneous Q24H  . levothyroxine  50 mcg Oral QAC breakfast  . potassium chloride  40 mEq Oral Once   Continuous Infusions: . sodium chloride 250 mL (04/07/19 0549)  . cefTRIAXone (ROCEPHIN)  IV 2 g (04/07/19 0654)     LOS: 3 days   Enedelia Martorelli  [facility] Pager [phone].   *Please refer to [email or web address], password [email or web address] to get updated schedule on who will round on this patient, as hospitalists switch teams weekly. If 7PM-7AM, please contact night-coverage at [email or web address], password [email or web address] for any overnight needs.  04/07/2019, 12:45 PM

## 2019-04-07 NOTE — Progress Notes (Signed)
  BRIEF OVERNIGHT REPORT  SUBJECTIVE: Patient continues to have irregular rhythm up to 140s sustaining per primary nurse and not responding to betablocker.  OBJECTIVE: She is afebrile with blood pressure 125/76 mm Hg and pulse rate 133 beats/min. There were no focal neurological deficits; she was alert and oriented x4, and denies SOB, chest pain or palpitation.  ASSESSMENT: 73 y.o female with past medical hx of thrombocytopenia, hypothyroidism, RA, on Humira and recent steroid use, HLD, recurrent pneumonias, anxiety, and glaucoma admitted with sepsis secondary to pneumonia.  PLAN: 1. Intermittent tachy arrythmias -  Continues to have elevated HR up to 140S asymptomatic. No evidence of hemodynamic instability. Concerns for afib with rvr, - Will check Echocardiogram - Continue telemetry monitoring - Cardiology consult has been placed. Will send message to cardiology via [facility] as well have attending provider give them a call in the am. - Metoprolol IV prn - Check BMP+Mag and replace    Rufina Falco, DNP, CCRN, FNP-C [facility] Nurse Practitioner Between 7pm to 7am - Pager [phone] Actively using [employer] secure chat messaging  After 7am go to [email or web address] - [email or web address] select [facility]  [facility] [employer]  [phone]

## 2019-04-07 NOTE — Progress Notes (Signed)
Initial Nutrition Assessment  DOCUMENTATION CODES:   Non-severe (moderate) malnutrition in context of social or environmental circumstances  INTERVENTION:  Provide Ensure Enlive po BID, each supplement provides 350 kcal and 20 grams of protein.  Provided patient with recipe for homemade oral nutrition supplement she can make at home.  Encouraged adequate intake of calories and protein at meals.  NUTRITION DIAGNOSIS:   Moderate Malnutrition related to social / environmental circumstances(inadequate oral intake) as evidenced by moderate fat depletion, moderate-severe muscle depletion.  GOAL:   Patient will meet greater than or equal to 90% of their needs  MONITOR:   PO intake, Supplement acceptance, Labs, Weight trends, I & O's  REASON FOR ASSESSMENT:   Malnutrition Screening Tool    ASSESSMENT:   73 year old female with PMHx of rheumatoid arthritis on Humira, osteoarthritis, hypercholesterolemia, hypothyroidism admitted with sepsis secondary to PNA, atrial flutter with RVR.   Met with patient at bedside. She reports she has had a decreased appetite and intake for a while PTA but she is unsure exactly how long. She reports she is eating small amounts at meals at home. She had started to drink Ensure a while ago at home but could not afford to continue drinking them. She is amenable to drinking oral nutrition supplements as long as she can afford them. Patient reports her appetite is improving. She was able to eat almost all of her breakfast today.  Patient reports her UBW was 116 lbs and she last weighed this in August 2019. According to chart she hasn't weighed that since 2018 and weight has been slowly trending down. Patient is currently 47.8 kg (105.38 lbs).  Medications reviewed and include: calcium carbonate 500 mg daily, levothyroxine, potassium chloride 40 mEq once today, ceftriaxone.  Labs reviewed: Sodium 131.  NUTRITION - FOCUSED PHYSICAL EXAM:    Most Recent Value   Orbital Region  Moderate depletion  Upper Arm Region  Severe depletion  Thoracic and Lumbar Region  Moderate depletion  Buccal Region  Moderate depletion  Temple Region  Severe depletion  Clavicle Bone Region  Severe depletion  Clavicle and Acromion Bone Region  Moderate depletion  Scapular Bone Region  Moderate depletion  Dorsal Hand  Moderate depletion  Patellar Region  Moderate depletion  Anterior Thigh Region  Moderate depletion  Posterior Calf Region  Moderate depletion  Edema (RD Assessment)  None  Hair  Reviewed  Eyes  Reviewed  Mouth  Reviewed  Skin  Reviewed  Nails  Reviewed     Diet Order:   Diet Order            Diet Heart Room service appropriate? Yes; Fluid consistency: Thin  Diet effective now             EDUCATION NEEDS:   Education needs have been addressed  Skin:  Skin Assessment: Reviewed RN Assessment  Last BM:  04/05/2019 per chart  Height:   Ht Readings from Last 1 Encounters:  04/05/19 5' 1.5" (1.562 m)   Weight:   Wt Readings from Last 1 Encounters:  04/05/19 47.8 kg   Ideal Body Weight:  48.9 kg  BMI:  Body mass index is 19.59 kg/m.  Estimated Nutritional Needs:   Kcal:  1400-1600  Protein:  70-80 grams  Fluid:  1.4-1.6 L/day  Jacklynn Barnacle, MS, RD, LDN Office: [phone] Pager: [phone] After Hours/Weekend Pager: [phone]

## 2019-04-07 NOTE — Progress Notes (Signed)
*  PRELIMINARY RESULTS* Echocardiogram 2D Echocardiogram has been performed.  [name] Tamara Whitehead 04/07/2019, 9:24 AM

## 2019-04-07 NOTE — Consult Note (Addendum)
Cardiology Consultation Note    Patient ID: Tamara Whitehead, MRN: [id number], DOB/AGE: 28-Dec-1945 73 y.o. Admit date: 04/04/2019   Date of Consult: 04/07/2019 Primary Physician: Leone Haven, MD Primary Cardiologist: none  Chief Complaint: sob Reason for Consultation: afib Requesting MD: Dr. Mal Misty  HPI: Tamara Whitehead is a 73 y.o. female with history of hypertension and rheumatoid arthritis who was admitted with pneumonia.  She was noted to go into a rapid atrial fibrillation/flutter last p.m.  Has had no cardiac problems previous to this.  She is currently back in sinus rhythm.  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 She takes crestor 40 mg daily and levothyroxine as an out patient. She was placed on iv abx on admission.  Chest x-ray on admission revealed right upper lobe consolidation consistent with pneumonia.  Electrocardiogram on admission revealed sinus rhythm.  Last p.m. she developed atrial flutter with rapid ventricular response.  Rates were up to 140.  Electrolytes were normal as was her creatinine.  Liver transaminases were mildly elevated.  Magnesium was normal.  This morning she is in sinus rhythm.  She is asymptomatic other than a pleuritic cough.  She has no prior history of exertional chest pain, arrhythmia.  She is reasonably active.  She denies syncope or presyncope.  She does have a family history of heart disease.  Past Medical History:  Diagnosis Date  . Arthritis    Osteoarthritis/Rheumatoid  . Cancer (HCC)    Skin  . Cataract   . Elevated blood pressure   . Fibrocystic breast disease   . Glaucoma   . Hypercholesterolemia   . Thyroid disease    Hypothyroid      Surgical History:  Past Surgical History:  Procedure Laterality Date  . ABDOMINAL HYSTERECTOMY     Total abd. hysterectomu and bilateral  salping-oophorectomy  . EYE SURGERY Right    Glaucoma surgery  . HEMORRHOID SURGERY    . status post dilatation and curettage  1975  . TONSILLECTOMY    . TUBAL LIGATION       Home Meds: Prior to Admission medications   Medication Sig Start Date End Date Taking? Authorizing Provider  Adalimumab 40 MG/0.8ML PNKT Inject 40 mg into the skin every 14 (fourteen) days. 10/24/16  Yes [provider]  Calcium Carbonate (CALCIUM 600 PO) Take 600 mg by mouth daily.   Yes [provider]  Coenzyme Q-10 100 MG capsule Take 100 mg by mouth daily.   Yes [provider]  levothyroxine (SYNTHROID) 50 MCG tablet Take 1 tablet (50 mcg total) by mouth daily before breakfast. 01/16/19  Yes Leone Haven, MD  rosuvastatin (CRESTOR) 40 MG tablet Take 1 tablet (40 mg total) by mouth daily. 01/20/19  Yes Leone Haven, MD  naproxen sodium (ALEVE) 220 MG tablet Take 220 mg by mouth 2 (two) times daily with a meal.    [provider]    Inpatient Medications:  . calcium carbonate  500 mg Oral Daily  . enoxaparin (LOVENOX) injection  40 mg Subcutaneous Q24H  . levothyroxine  50 mcg Oral QAC breakfast  . rosuvastatin  40 mg Oral q1800   . sodium chloride 250 mL (04/07/19 0549)  . cefTRIAXone (ROCEPHIN)  IV 2 g (04/07/19 0654)    Allergies:  Allergies  Allergen Reactions  . Codeine Nausea And Vomiting  Can take cough syrups    Social History   Socioeconomic History  . Marital status: Single    Spouse name: Not on file  . Number of children: Not on file  . Years of education: Not on file  . Highest education level: Not on file  Occupational History  . Not on file  Social Needs  . Financial resource strain: Not very hard  . Food insecurity    Worry: Never true    Inability: Never true  . Transportation needs    Medical: No    Non-medical: No  Tobacco Use  . Smoking status: Current Every Day Smoker    Packs/day: 0.25    Types: Cigarettes  .  Smokeless tobacco: Never Used  Substance and Sexual Activity  . Alcohol use: Yes    Comment: wine occasional  . Drug use: No  . Sexual activity: Not on file  Lifestyle  . Physical activity    Days per week: 5 days    Minutes per session: 30 min  . Stress: Only a little  Relationships  . Social Herbalist on phone: Not on file    Gets together: Not on file    Attends religious service: Not on file    Active member of club or organization: Not on file    Attends meetings of clubs or organizations: Not on file    Relationship status: Not on file  . Intimate partner violence    Fear of current or ex partner: Not on file    Emotionally abused: Not on file    Physically abused: Not on file    Forced sexual activity: Not on file  Other Topics Concern  . Not on file  Social History Narrative  . Not on file     Family History  Problem Relation Age of Onset  . CAD Mother   . Arthritis Mother        Rheumatoid Arthritis  . Arthritis Father        Rheumatoid/Osteoarthritis and Blindness, bleeding Tendencies,Glaucoma  . CAD Father   . Hypertension Maternal Grandmother   . COPD Neg Hx      Review of Systems: A 12-system review of systems was performed and is negative except as noted in the HPI.  Labs: No results for input(s): CKTOTAL, CKMB, TROPONINI in the last 72 hours. Lab Results  Component Value Date   WBC 13.8 (H) 04/06/2019   HGB 15.2 (H) 04/06/2019   HCT 44.6 04/06/2019   MCV 93.5 04/06/2019   PLT 130 (L) 04/06/2019    Recent Labs  Lab 04/07/19 0405  NA 131*  K 3.7  CL 99  CO2 24  BUN 14  CREATININE 0.76  CALCIUM 7.9*  PROT 5.5*  BILITOT 0.8  ALKPHOS 55  ALT 111*  AST 210*  GLUCOSE 92   Lab Results  Component Value Date   CHOL 288 (H) 01/15/2019   HDL 39.30 01/15/2019   LDLCALC 180 (H) 11/14/2016   TRIG 233.0 (H) 01/15/2019   No results found for: DDIMER  Radiology/Studies:  Dg Chest 2 View  Result Date: 04/04/2019 CLINICAL  DATA:  Patient feels like she is going to pass out. Nauseous for 2 days. EXAM: CHEST - 2 VIEW COMPARISON:  01/19/2011 FINDINGS: There is focal consolidation in the right upper lobe that is new from the prior study. This is consistent with pneumonia. No other areas of lung consolidation. Lungs are hyperexpanded. There is linear scarring or atelectasis  at the bases. No evidence of pulmonary edema. No pleural effusion or pneumothorax. Cardiac silhouette is normal in size. No mediastinal or hilar masses. No evidence of adenopathy. Skeletal structures are intact. IMPRESSION: Right upper lobe consolidation consistent with pneumonia. Electronically Signed   By: Lajean Manes M.D.   On: 04/04/2019 16:25    Wt Readings from Last 3 Encounters:  04/05/19 47.8 kg  04/04/19 46.3 kg  01/15/19 49.5 kg    EKG: Sinus rhythm with nonspecific ST-T wave changes.  Transient atrial flutter with rapid ventricular response.  Physical Exam:  Blood pressure (!) 153/71, pulse 92, temperature (!) 100.4 F (38 C), temperature source Oral, resp. rate 17, height 5' 1.5" (1.562 m), weight 47.8 kg, SpO2 100 %. Body mass index is 19.59 kg/m. General: Well developed, well nourished, in no acute distress. Head: Normocephalic, atraumatic, sclera non-icteric, no xanthomas, nares are without discharge.  Neck: Negative for carotid bruits. JVD not elevated. Lungs: Scattered rhonchi bilaterally. Heart: RRR with S1 S2. No murmurs, rubs, or gallops appreciated. Abdomen: Soft, non-tender, non-distended with normoactive bowel sounds. No hepatomegaly. No rebound/guarding. No obvious abdominal masses. Msk:  Strength and tone appear normal for age. Extremities: No clubbing or cyanosis. No edema.  Distal pedal pulses are 2+ and equal bilaterally. Neuro: Alert and oriented X 3. No facial asymmetry. No focal deficit. Moves all extremities spontaneously. Psych:  Responds to questions appropriately with a normal affect.     Assessment and  Plan  73 year old female with no prior cardiac history admitted with community acquired pneumonia.  On empiric antibiotics.  She also has hyperlipidemia and is on rosuvastatin.  She was being treated with IV antibiotics for her pneumonia and developed atrial flutter with rapid ventricular response last p.m.  Rates were up to the 140 range.  She received IV labetalol.  She is converted back to sinus rhythm currently.  Echo reveals normal LV function with no significant valvular abnormalities  Atrial flutter-likely secondary to her airspace disease.  Volume status appears stable.  Electrolytes and magnesium were normal.  TSH was normal.  Continue to empirically treat her pneumonia.  Not a candidate for anticoagulation at present as likely this was a transient atrial arrhythmia secondary to her infectious process.  We will continue to follow.  Pneumonia-continue with current therapy.  Hyperlipidemia-was recently placed on rosuvastatin at a high dose.  This was started in August.  Transaminases were normal at that time although are mildly elevated in previous years..  Panel was reactive for a AB total, hep B was nonreactive.  Has been seen by GI.  May need to discontinue rosuvastatin.  Will discontinue all if she is hospitalized and this can be further addressed as an outpatient by her gastroenterologist and primary care physician.  Signed, Teodoro Spray MD 04/07/2019, 10:13 AM Pager: [phone]

## 2019-04-08 ENCOUNTER — Inpatient Hospital Stay: Payer: Medicare HMO

## 2019-04-08 DIAGNOSIS — E440 Moderate protein-calorie malnutrition: Secondary | ICD-10-CM | POA: Insufficient documentation

## 2019-04-08 LAB — COMPREHENSIVE METABOLIC PANEL
ALT: 274 U/L — ABNORMAL HIGH (ref 0–44)
AST: 693 U/L — ABNORMAL HIGH (ref 15–41)
Albumin: 2.1 g/dL — ABNORMAL LOW (ref 3.5–5.0)
Alkaline Phosphatase: 84 U/L (ref 38–126)
Anion gap: 9 (ref 5–15)
BUN: 15 mg/dL (ref 8–23)
CO2: 23 mmol/L (ref 22–32)
Calcium: 7.9 mg/dL — ABNORMAL LOW (ref 8.9–10.3)
Chloride: 101 mmol/L (ref 98–111)
Creatinine, Ser: 0.61 mg/dL (ref 0.44–1.00)
GFR calc Af Amer: >60 mL/min (ref >60)
GFR calc non Af Amer: >60 mL/min (ref >60)
Glucose, Bld: 121 mg/dL — ABNORMAL HIGH (ref 70–99)
Potassium: 3.8 mmol/L (ref 3.5–5.1)
Sodium: 133 mmol/L — ABNORMAL LOW (ref 135–145)
Total Bilirubin: 0.9 mg/dL (ref 0.3–1.2)
Total Protein: 5.3 g/dL — ABNORMAL LOW (ref 6.5–8.1)

## 2019-04-08 LAB — CBC WITH DIFFERENTIAL/PLATELET
Abs Immature Granulocytes: 0.15 10*3/uL — ABNORMAL HIGH (ref 0.00–0.07)
Basophils Absolute: 0 10*3/uL (ref 0.0–0.1)
Basophils Relative: 0 %
Eosinophils Absolute: 0 10*3/uL (ref 0.0–0.5)
Eosinophils Relative: 0 %
HCT: 35.1 % — ABNORMAL LOW (ref 36.0–46.0)
Hemoglobin: 12.1 g/dL (ref 12.0–15.0)
Immature Granulocytes: 2 %
Lymphocytes Relative: 11 %
Lymphs Abs: 0.8 10*3/uL (ref 0.7–4.0)
MCH: 31.6 pg (ref 26.0–34.0)
MCHC: 34.5 g/dL (ref 30.0–36.0)
MCV: 91.6 fL (ref 80.0–100.0)
Monocytes Absolute: 0.4 10*3/uL (ref 0.1–1.0)
Monocytes Relative: 5 %
Neutro Abs: 6.2 10*3/uL (ref 1.7–7.7)
Neutrophils Relative %: 82 %
Platelets: 104 10*3/uL — ABNORMAL LOW (ref 150–400)
RBC: 3.83 MIL/uL — ABNORMAL LOW (ref 3.87–5.11)
RDW: 13.4 % (ref 11.5–15.5)
Smear Review: NORMAL
WBC: 7.5 10*3/uL (ref 4.0–10.5)
nRBC: 0 % (ref 0.0–0.2)

## 2019-04-08 LAB — MAGNESIUM: Magnesium: 1.9 mg/dL (ref 1.7–2.4)

## 2019-04-08 MED ORDER — DILTIAZEM HCL 30 MG PO TABS
30.0000 mg | ORAL_TABLET | Freq: Four times a day (QID) | ORAL | Status: DC
  Administered 2019-04-08 – 2019-04-09 (×4): 30 mg via ORAL
  Filled 2019-04-08 (×4): qty 1

## 2019-04-08 MED ORDER — PROMETHAZINE HCL 25 MG/ML IJ SOLN
12.5000 mg | Freq: Four times a day (QID) | INTRAMUSCULAR | Status: DC | PRN
  Filled 2019-04-08: qty 1

## 2019-04-08 MED ORDER — PIPERACILLIN-TAZOBACTAM 3.375 G IVPB
3.3750 g | Freq: Three times a day (TID) | INTRAVENOUS | Status: DC
  Administered 2019-04-08 – 2019-04-10 (×5): 3.375 g via INTRAVENOUS
  Filled 2019-04-08 (×5): qty 50

## 2019-04-08 MED ORDER — SENNOSIDES-DOCUSATE SODIUM 8.6-50 MG PO TABS
1.0000 | ORAL_TABLET | Freq: Two times a day (BID) | ORAL | Status: DC
  Administered 2019-04-08 – 2019-04-10 (×5): 1 via ORAL
  Filled 2019-04-08 (×5): qty 1

## 2019-04-08 MED ORDER — APIXABAN 5 MG PO TABS
5.0000 mg | ORAL_TABLET | Freq: Two times a day (BID) | ORAL | Status: DC
  Administered 2019-04-08 – 2019-04-10 (×5): 5 mg via ORAL
  Filled 2019-04-08 (×5): qty 1

## 2019-04-08 MED ORDER — POTASSIUM CHLORIDE CRYS ER 20 MEQ PO TBCR
40.0000 meq | EXTENDED_RELEASE_TABLET | Freq: Once | ORAL | Status: AC
  Administered 2019-04-08: 17:00:00 40 meq via ORAL
  Filled 2019-04-08: qty 2

## 2019-04-08 MED ORDER — MAGNESIUM OXIDE 400 (241.3 MG) MG PO TABS
400.0000 mg | ORAL_TABLET | Freq: Two times a day (BID) | ORAL | Status: DC
  Administered 2019-04-08 – 2019-04-10 (×5): 400 mg via ORAL
  Filled 2019-04-08 (×5): qty 1

## 2019-04-08 NOTE — Progress Notes (Signed)
Patient Name: Tamara Whitehead Date of Encounter: 04/08/2019  Hospital Problem List     Active Problems:   Community acquired pneumonia   Hyponatremia   Nausea and vomiting   Dehydration   Sepsis due to pneumonia [facility])   Atrial flutter with rapid ventricular response (HCC)   Malnutrition of moderate degree    Patient Profile     73 y.o. female with history of hypertension and rheumatoid arthritis who was admitted with pneumonia.  She was noted to go into a rapid atrial fibrillation/flutter last p.m.  Has had no cardiac problems previous to this.  She was back in nsr yesterday during my exam but is back in afib with fairly rapid vr. Fairly asymptomatic with this.  Echocardiogram showed normal LV function with no significant structural valvular abnormalities  Subjective   Mild sob  Inpatient Medications    . calcium carbonate  500 mg Oral Daily  . enoxaparin (LOVENOX) injection  40 mg Subcutaneous Q24H  . feeding supplement (ENSURE ENLIVE)  237 mL Oral BID BM  . levothyroxine  50 mcg Oral QAC breakfast    Vital Signs    Vitals:   04/08/19 0548 04/08/19 0630 04/08/19 0833 04/08/19 1132  BP:  124/82 (!) 95/55   Pulse:  98 (!) 101 (!) 158  Resp: 20     Temp:      TempSrc:      SpO2:  95% 95%   Weight:      Height:        Intake/Output Summary (Last 24 hours) at 04/08/2019 1146 Last data filed at 04/08/2019 0900 Gross per 24 hour  Intake 240 ml  Output -  Net 240 ml   Filed Weights   04/05/19 1628  Weight: 47.8 kg    Physical Exam    GEN: Well nourished, well developed, in no acute distress.  HEENT: normal.  Neck: Supple, no JVD, carotid bruits, or masses. Cardiac: irr, irr, tachycardia Respiratory:  Decreased breath sounds bilaterally GI: Soft, nontender, nondistended, BS + x 4. MS: no deformity or atrophy. Skin: warm and dry, no rash. Neuro:  Strength and sensation are intact. Psych: Normal affect.  Labs    CBC Recent Labs    04/06/19 0427  04/08/19 0417  WBC 13.8* 7.5  NEUTROABS 12.7* 6.2  HGB 15.2* 12.1  HCT 44.6 35.1*  MCV 93.5 91.6  PLT 130* 938*   Basic Metabolic Panel Recent Labs    04/05/19 1601  04/07/19 0405 04/08/19 0417  NA  --    < > 131* 133*  K  --    < > 3.7 3.8  CL  --    < > 99 101  CO2  --    < > 24 23  GLUCOSE  --    < > 92 121*  BUN  --    < > 14 15  CREATININE  --    < > 0.76 0.61  CALCIUM  --    < > 7.9* 7.9*  MG 2.0  --   --  1.9   < > = values in this interval not displayed.   Liver Function Tests Recent Labs    04/07/19 0405 04/08/19 0417  AST 210* 693*  ALT 111* 274*  ALKPHOS 55 84  BILITOT 0.8 0.9  PROT 5.5* 5.3*  ALBUMIN 2.3* 2.1*   No results for input(s): LIPASE, AMYLASE in the last 72 hours. Cardiac Enzymes No results for input(s): CKTOTAL, CKMB, CKMBINDEX, TROPONINI in  the last 72 hours. BNP No results for input(s): BNP in the last 72 hours. D-Dimer No results for input(s): DDIMER in the last 72 hours. Hemoglobin A1C No results for input(s): HGBA1C in the last 72 hours. Fasting Lipid Panel No results for input(s): CHOL, HDL, LDLCALC, TRIG, CHOLHDL, LDLDIRECT in the last 72 hours. Thyroid Function Tests Recent Labs    04/07/19 0405  TSH 1.853    Telemetry    Atrial fibrillation with rvr  ECG    afib with rvr  Radiology    Dg Chest 2 View  Result Date: 04/08/2019 CLINICAL DATA:  73 year old female with pneumonia. EXAM: CHEST - 2 VIEW COMPARISON:  Chest radiograph dated 04/04/2019. FINDINGS: Right upper lobe consolidation has increased in size since the radiograph of 04/04/2019 most consistent with worsening pneumonia. Clinical correlation and follow-up to resolution recommended. There has been interval development of a small right pleural effusion. The left lung remains clear. No pneumothorax. The cardiac silhouette is within normal limits. No acute osseous pathology. IMPRESSION: 1. Worsening right upper lobe pneumonia. Follow-up to resolution  recommended. 2. Interval development of a small right pleural effusion. 3. No pneumothorax. Electronically Signed   By: Anner Crete M.D.   On: 04/08/2019 10:14   Dg Chest 2 View  Result Date: 04/04/2019 CLINICAL DATA:  Patient feels like she is going to pass out. Nauseous for 2 days. EXAM: CHEST - 2 VIEW COMPARISON:  01/19/2011 FINDINGS: There is focal consolidation in the right upper lobe that is new from the prior study. This is consistent with pneumonia. No other areas of lung consolidation. Lungs are hyperexpanded. There is linear scarring or atelectasis at the bases. No evidence of pulmonary edema. No pleural effusion or pneumothorax. Cardiac silhouette is normal in size. No mediastinal or hilar masses. No evidence of adenopathy. Skeletal structures are intact. IMPRESSION: Right upper lobe consolidation consistent with pneumonia. Electronically Signed   By: Lajean Manes M.D.   On: 04/04/2019 16:25   US Liver Doppler  Result Date: 04/08/2019 CLINICAL DATA:  Elevated LFTs. EXAM: DUPLEX ULTRASOUND OF LIVER TECHNIQUE: Color and duplex Doppler ultrasound was performed to evaluate the hepatic in-flow and out-flow vessels. COMPARISON:  Abdominal MRI-03/29/2017; abdominal ultrasound-01/30/2017 FINDINGS: Liver: Normal parenchymal echogenicity. Normal hepatic contour without nodularity. The approximately 1.6 cm anechoic cyst within the subcapsular aspect the left lobe of the liver is unchanged compared to the 01/2017 examination. No new discrete hepatic lesions. No intrahepatic biliary ductal dilatation. Main Portal Vein size: 1.1 cm Normal velocities and directional flow are demonstrated throughout the hepatic vasculature as follows: Portal Vein Velocities Main Prox:  56 cm/sec Main Mid: 59 cm/sec Main Dist:  41 cm/sec Right: 15 cm/sec Left: 11 cm/sec Hepatic Vein Velocities Right:  30 cm/sec Middle:  35 cm/sec Left:  90 cm/sec IVC: Present and patent with normal respiratory phasicity. Hepatic Artery  Velocity:  92 cm/sec Splenic Vein Velocity:  25 cm/sec Spleen: 7 cm x 4 cm x 8 cm with a total volume of 121 cm^3 (411 cm^3 is upper limit normal) Portal Vein Occlusion/Thrombus: No Splenic Vein Occlusion/Thrombus: No Ascites: None Varices: None IMPRESSION: No explanation for patient's persistently elevated LFTs. Specifically, hepatic vasculature is widely patent with normal velocities and directional flow. Electronically Signed   By: Sandi Mariscal M.D.   On: 04/08/2019 07:10    Assessment & Plan    73 year old female with no prior cardiac history admitted with community acquired pneumonia.  On empiric antibiotics.  She also has hyperlipidemia and is on  rosuvastatin.  She was being treated with IV antibiotics for her pneumonia . Atrial flutter-likely secondary to her airspace disease.  Volume status appears stable.  Electrolytes and magnesium were normal.  TSH was normal.    1. afib-had transient afib yesterday but had converted back to nsr. Now back in afib. Will add cardizem 30 q6 and follow heart rate response and hemodynaics. CHADS2VASc score of2, 1 for age greater than 40 and 46 for female gender. Will add eliquis 5 mg bid.  Pneumonia-continue with current therapy.  Hyperlipidemia-was on rosuvastatin. Have d/ced at least temporarily due to elevated transaminases.    Signed, Javier Docker Rosali Augello MD 04/08/2019, 11:46 AM  Pager: [phone]

## 2019-04-08 NOTE — Progress Notes (Addendum)
Progress Note    Tamara Whitehead  VFI:[id number] DOB: [birth year]  DOA: 04/04/2019 PCP: Leone Haven, MD      Brief Narrative:    Medical records reviewed and are as summarized below:  Tamara Whitehead is an 73 y.o. female with history of rheumatoid arthritis on Humira, osteoarthritis, recent use of prednisone, hypercholesterolemia, hypothyroidism,.  She presented to the hospital with vomiting, dizziness, unsteady gait, productive cough, pleuritic pain between the shoulder blades and exertional shortness of breath.  She was found to have sepsis secondary to right upper lobe pneumonia.      Assessment/Plan:   Active Problems:   Community acquired pneumonia   Hyponatremia   Nausea and vomiting   Dehydration   Sepsis due to pneumonia [facility])   Atrial flutter with rapid ventricular response (HCC)   Malnutrition of moderate degree     Sepsis secondary to community-acquired pneumonia in an immunocompromised patient: Repeat chest x-ray done today showed worsening right upper lobe pneumonia.  Change IV Rocephin to Zosyn.  She has completed Zithromax.  Atrial flutter/atrial fibrillation with rapid ventricular response: She has been started on oral Cardizem and Eliquis by cardiologist. CHa2Ds2VASC score of 2  Mild hyponatremia: Asymptomatic and stable.   Acute hypoxemic respiratory failure: Patient is back on oxygen.  She is requiring 3 L/min oxygen via nasal cannula.  Hypokalemia: Keep potassium level to at least 4 and magnesium level to 2 or greater  Dehydration/mild elevated creatinine: Improved  Nausea and vomiting: Complains of nausea that is not responding to Zofran.  Started Phenergan as needed for nausea.  Elevated liver enzymes: Liver enzymes are still increasing.  Liver ultrasound did not show any abnormality.  Hepatitis panel has been ordered.  She is off of rosuvastatin.  Moderate protein calorie malnutrition: Dietary supplements recommended.  Follow-up with  dietitian.    Family Communication/Anticipated D/C date and plan/Code Status   DVT prophylaxis: Eliquis Code Status: Full code Family Communication: Plan discussed with the patient Disposition Plan: Home in 1 to 2 days depending on clinical improvement      Subjective:   She complains of nausea there is no responding to Zofran.  She still has some shortness of breath especially with exertion.  Overnight events noted.  Telemetry showed recurrent rapid A. Fib/ flutter   Objective:    Vitals:   04/08/19 0528 04/08/19 0548 04/08/19 0630 04/08/19 0833  BP: (!) 139/91  124/82 (!) 95/55  Pulse: (!) 105  98 (!) 101  Resp:  20    Temp:      TempSrc:      SpO2: 99%  95% 95%  Weight:      Height:        Intake/Output Summary (Last 24 hours) at 04/08/2019 1054 Last data filed at 04/08/2019 0900 Gross per 24 hour  Intake 240 ml  Output --  Net 240 ml   Filed Weights   04/05/19 1628  Weight: 47.8 kg    Exam:   GEN: NAD SKIN: No rash EYES: EOMI ENT: MMM CV: Tachycardic, irregular rate and rhythm PULM: CTA B ABD: soft, ND, NT, +BS CNS: AAO x 3, non focal EXT: No edema or tenderness     Data Reviewed:   I have personally reviewed following labs and imaging studies:  Labs: Labs show the following:   Basic Metabolic Panel: Recent Labs  Lab 04/04/19 1530 04/05/19 0840 04/05/19 1601 04/06/19 0427 04/07/19 0405 04/08/19 0417  NA 131* 133*  --  132* 131* 133*  K 4.0 3.2*  --  3.7 3.7 3.8  CL 94* 101  --  101 99 101  CO2 23 21*  --  19* 24 23  GLUCOSE 150* 124*  --  90 92 121*  BUN 14 18  --  15 14 15   CREATININE 1.10* 1.02*  --  0.83 0.76 0.61  CALCIUM 9.3 8.0*  --  8.2* 7.9* 7.9*  MG  --   --  2.0  --   --  1.9   GFR Estimated Creatinine Clearance: 46.6 mL/min (by C-G formula based on SCr of 0.61 mg/dL). Liver Function Tests: Recent Labs  Lab 04/04/19 1530 04/05/19 0840 04/06/19 0427 04/07/19 0405 04/08/19 0417  AST 59* 143* 167* 210* 693*   ALT 60* 93* 110* 111* 274*  ALKPHOS 70 54 61 55 84  BILITOT 1.2 1.1 0.8 0.8 0.9  PROT 7.8 6.7 7.1 5.5* 5.3*  ALBUMIN 3.8 3.0* 3.0* 2.3* 2.1*   No results for input(s): LIPASE, AMYLASE in the last 168 hours. No results for input(s): AMMONIA in the last 168 hours. Coagulation profile Recent Labs  Lab 04/04/19 1530  INR 1.4*    CBC: Recent Labs  Lab 04/04/19 1530 04/05/19 0645 04/06/19 0427 04/08/19 0417  WBC 24.6* 15.9* 13.8* 7.5  NEUTROABS 23.2* 14.8* 12.7* 6.2  HGB 15.7* 13.6 15.2* 12.1  HCT 44.7 38.9 44.6 35.1*  MCV 92.5 92.2 93.5 91.6  PLT 156 127* 130* 104*   Cardiac Enzymes: No results for input(s): CKTOTAL, CKMB, CKMBINDEX, TROPONINI in the last 168 hours. BNP (last 3 results) No results for input(s): PROBNP in the last 8760 hours. CBG: No results for input(s): GLUCAP in the last 168 hours. D-Dimer: No results for input(s): DDIMER in the last 72 hours. Hgb A1c: No results for input(s): HGBA1C in the last 72 hours. Lipid Profile: No results for input(s): CHOL, HDL, LDLCALC, TRIG, CHOLHDL, LDLDIRECT in the last 72 hours. Thyroid function studies: Recent Labs    04/07/19 0405  TSH 1.853   Anemia work up: No results for input(s): VITAMINB12, FOLATE, FERRITIN, TIBC, IRON, RETICCTPCT in the last 72 hours. Sepsis Labs: Recent Labs  Lab 04/04/19 1530 04/05/19 0645 04/06/19 0427 04/08/19 0417  WBC 24.6* 15.9* 13.8* 7.5  LATICACIDVEN 1.8  --   --   --     Microbiology Recent Results (from the past 240 hour(s))  Culture, blood (Routine x 2)     Status: None (Preliminary result)   Collection Time: 04/04/19  3:28 PM   Specimen: BLOOD  Result Value Ref Range Status   Specimen Description BLOOD RIGHT ANTECUBITAL  Final   Special Requests   Final    BOTTLES DRAWN AEROBIC AND ANAEROBIC Blood Culture adequate volume   Culture   Final    NO GROWTH 4 DAYS Performed at [facility], [address], [place], [place] [address]    Report Status  PENDING  Incomplete  Culture, blood (Routine x 2)     Status: None (Preliminary result)   Collection Time: 04/04/19  3:31 PM   Specimen: BLOOD  Result Value Ref Range Status   Specimen Description BLOOD LEFT ANTECUBITAL  Final   Special Requests   Final    BOTTLES DRAWN AEROBIC AND ANAEROBIC Blood Culture results may not be optimal due to an excessive volume of blood received in culture bottles   Culture   Final    NO GROWTH 4 DAYS Performed at [facility], [place],  [place], [place] [address]    Report Status PENDING  Incomplete  SARS CORONAVIRUS 2 (TAT 6-24 HRS) Nasopharyngeal Nasopharyngeal Swab     Status: None   Collection Time: 04/04/19  4:53 PM   Specimen: Nasopharyngeal Swab  Result Value Ref Range Status   SARS Coronavirus 2 NEGATIVE NEGATIVE Final    Comment: (NOTE) SARS-CoV-2 target nucleic acids are NOT DETECTED. The SARS-CoV-2 RNA is generally detectable in upper and lower respiratory specimens during the acute phase of infection. Negative results do not preclude SARS-CoV-2 infection, do not rule out co-infections with other pathogens, and should not be used as the sole basis for treatment or other patient management decisions. Negative results must be combined with clinical observations, patient history, and epidemiological information. The expected result is Negative. Fact Sheet for Patients: SugarRoll.be Fact Sheet for Healthcare Providers: https://www.woods-mathews.com/ This test is not yet approved or cleared by the [place] FDA and  has been authorized for detection and/or diagnosis of SARS-CoV-2 by FDA under an Emergency Use Authorization (EUA). This EUA will remain  in effect (meaning this test can be used) for the duration of the COVID-19 declaration under Section 56 4(b)(1) of the Act, 21 U.S.C. section 360bbb-3(b)(1), unless the authorization is terminated or revoked sooner. Performed at  [facility], [place] [address] [place], [place] [address]   Urine culture     Status: Abnormal   Collection Time: 04/04/19  7:00 PM   Specimen: Urine, Clean Catch  Result Value Ref Range Status   Specimen Description   Final    URINE, CLEAN CATCH Performed at [facility], [address], [place], [place] [address]    Special Requests   Final    Normal Performed at [facility], [address] [place], [place] [address]    Culture (A)  Final    <10,000 COLONIES/mL INSIGNIFICANT GROWTH Performed at [facility], [place] [address], [place], [place] [address]    Report Status 04/06/2019 FINAL  Final    Procedures and diagnostic studies:  Dg Chest 2 View  Result Date: 04/08/2019 CLINICAL DATA:  73 year old female with pneumonia. EXAM: CHEST - 2 VIEW COMPARISON:  Chest radiograph dated 04/04/2019. FINDINGS: Right upper lobe consolidation has increased in size since the radiograph of 04/04/2019 most consistent with worsening pneumonia. Clinical correlation and follow-up to resolution recommended. There has been interval development of a small right pleural effusion. The left lung remains clear. No pneumothorax. The cardiac silhouette is within normal limits. No acute osseous pathology. IMPRESSION: 1. Worsening right upper lobe pneumonia. Follow-up to resolution recommended. 2. Interval development of a small right pleural effusion. 3. No pneumothorax. Electronically Signed   By: Anner Crete M.D.   On: 04/08/2019 10:14   US Liver Doppler  Result Date: 04/08/2019 CLINICAL DATA:  Elevated LFTs. EXAM: DUPLEX ULTRASOUND OF LIVER TECHNIQUE: Color and duplex Doppler ultrasound was performed to evaluate the hepatic in-flow and out-flow vessels. COMPARISON:  Abdominal MRI-03/29/2017; abdominal ultrasound-01/30/2017 FINDINGS: Liver: Normal parenchymal echogenicity. Normal hepatic contour without nodularity. The approximately 1.6 cm anechoic cyst within the  subcapsular aspect the left lobe of the liver is unchanged compared to the 01/2017 examination. No new discrete hepatic lesions. No intrahepatic biliary ductal dilatation. Main Portal Vein size: 1.1 cm Normal velocities and directional flow are demonstrated throughout the hepatic vasculature as follows: Portal Vein Velocities Main Prox:  56 cm/sec Main Mid: 59 cm/sec Main Dist:  41 cm/sec Right: 15 cm/sec Left: 11 cm/sec Hepatic Vein Velocities  Right:  30 cm/sec Middle:  35 cm/sec Left:  90 cm/sec IVC: Present and patent with normal respiratory phasicity. Hepatic Artery Velocity:  92 cm/sec Splenic Vein Velocity:  25 cm/sec Spleen: 7 cm x 4 cm x 8 cm with a total volume of 121 cm^3 (411 cm^3 is upper limit normal) Portal Vein Occlusion/Thrombus: No Splenic Vein Occlusion/Thrombus: No Ascites: None Varices: None IMPRESSION: No explanation for patient's persistently elevated LFTs. Specifically, hepatic vasculature is widely patent with normal velocities and directional flow. Electronically Signed   By: Sandi Mariscal M.D.   On: 04/08/2019 07:10    Medications:    calcium carbonate  500 mg Oral Daily   enoxaparin (LOVENOX) injection  40 mg Subcutaneous Q24H   feeding supplement (ENSURE ENLIVE)  237 mL Oral BID BM   levothyroxine  50 mcg Oral QAC breakfast   Continuous Infusions:  sodium chloride 20 mL (04/08/19 0542)   cefTRIAXone (ROCEPHIN)  IV 2 g (04/08/19 0544)     LOS: 4 days   Ranie Chinchilla  [facility] Pager [phone].   *Please refer to [email or web address], password [email or web address] to get updated schedule on who will round on this patient, as hospitalists switch teams weekly. If 7PM-7AM, please contact night-coverage at [email or web address], password [email or web address] for any overnight needs.  04/08/2019, 10:54 AM

## 2019-04-08 NOTE — Care Management Important Message (Signed)
Important Message  Patient Details  Name: TAWYNA PELLOT MRN: [id number] Date of Birth: 03/06/1946   Medicare Important Message Given:  Yes     Dannette Barbara 04/08/2019, 10:43 AM

## 2019-04-09 DIAGNOSIS — I4891 Unspecified atrial fibrillation: Secondary | ICD-10-CM

## 2019-04-09 LAB — CULTURE, BLOOD (ROUTINE X 2)
Culture: NO GROWTH
Culture: NO GROWTH
Special Requests: ADEQUATE

## 2019-04-09 MED ORDER — DILTIAZEM HCL 30 MG PO TABS
60.0000 mg | ORAL_TABLET | Freq: Four times a day (QID) | ORAL | Status: DC
  Administered 2019-04-09 – 2019-04-10 (×4): 60 mg via ORAL
  Filled 2019-04-09 (×4): qty 2

## 2019-04-09 NOTE — Progress Notes (Signed)
Patient Name: Tamara Whitehead Date of Encounter: 04/09/2019  Hospital Problem List     Active Problems:   Community acquired pneumonia   Hyponatremia   Nausea and vomiting   Dehydration   Sepsis due to pneumonia [facility])   Atrial flutter with rapid ventricular response (HCC)   Malnutrition of moderate degree    Patient Profile     73 y.o.femalewith history ofhypertension and rheumatoid arthritis who was admitted with pneumonia. She was noted to go into a rapid atrial fibrillation/flutter last p.m. Has had no cardiac problems previous to this. She was in nsr but converted back in afib with fairly rapid vr. Fairly asymptomatic with this. Echocardiogram showed normal LV function with no significant structural valvular abnormalities Was started on cardizem 30q6 and Eliquis yesterday but still somewhat tachycardic.  Subjective   Fells better today  Inpatient Medications    . apixaban  5 mg Oral BID  . calcium carbonate  500 mg Oral Daily  . diltiazem  30 mg Oral Q6H  . feeding supplement (ENSURE ENLIVE)  237 mL Oral BID BM  . levothyroxine  50 mcg Oral QAC breakfast  . magnesium oxide  400 mg Oral BID  . senna-docusate  1 tablet Oral BID    Vital Signs    Vitals:   04/09/19 0006 04/09/19 0332 04/09/19 0416 04/09/19 1118  BP: 112/74 122/86 129/76 121/85  Pulse: (!) 122 (!) 116 68 (!) 106  Resp: 18 18  18   Temp: 99.1 F (37.3 C) 99.6 F (37.6 C) 98.5 F (36.9 C) 99 F (37.2 C)  TempSrc: Oral Oral Oral Oral  SpO2: 91% 94% 93% 91%  Weight:      Height:        Intake/Output Summary (Last 24 hours) at 04/09/2019 1247 Last data filed at 04/09/2019 0900 Gross per 24 hour  Intake 643.83 ml  Output -  Net 643.83 ml   Filed Weights   04/05/19 1628  Weight: 47.8 kg    Physical Exam    GEN: Well nourished, well developed, in no acute distress.  HEENT: normal.  Neck: Supple, no JVD, carotid bruits, or masses. Cardiac: Irr, irr Respiratory:  Respirations  regular and unlabored, clear to auscultation bilaterally. GI: Soft, nontender, nondistended, BS + x 4. MS: no deformity or atrophy. Skin: warm and dry, no rash. Neuro:  Strength and sensation are intact. Psych: Normal affect.  Labs    CBC Recent Labs    04/08/19 0417  WBC 7.5  NEUTROABS 6.2  HGB 12.1  HCT 35.1*  MCV 91.6  PLT 545*   Basic Metabolic Panel Recent Labs    04/07/19 0405 04/08/19 0417  NA 131* 133*  K 3.7 3.8  CL 99 101  CO2 24 23  GLUCOSE 92 121*  BUN 14 15  CREATININE 0.76 0.61  CALCIUM 7.9* 7.9*  MG  --  1.9   Liver Function Tests Recent Labs    04/07/19 0405 04/08/19 0417  AST 210* 693*  ALT 111* 274*  ALKPHOS 55 84  BILITOT 0.8 0.9  PROT 5.5* 5.3*  ALBUMIN 2.3* 2.1*   No results for input(s): LIPASE, AMYLASE in the last 72 hours. Cardiac Enzymes No results for input(s): CKTOTAL, CKMB, CKMBINDEX, TROPONINI in the last 72 hours. BNP No results for input(s): BNP in the last 72 hours. D-Dimer No results for input(s): DDIMER in the last 72 hours. Hemoglobin A1C No results for input(s): HGBA1C in the last 72 hours. Fasting Lipid Panel No results  for input(s): CHOL, HDL, LDLCALC, TRIG, CHOLHDL, LDLDIRECT in the last 72 hours. Thyroid Function Tests Recent Labs    04/07/19 0405  TSH 1.853    Telemetry    afib with variable but fairly rapid vr  ECG       Radiology    Dg Chest 2 View  Result Date: 04/08/2019 CLINICAL DATA:  73 year old female with pneumonia. EXAM: CHEST - 2 VIEW COMPARISON:  Chest radiograph dated 04/04/2019. FINDINGS: Right upper lobe consolidation has increased in size since the radiograph of 04/04/2019 most consistent with worsening pneumonia. Clinical correlation and follow-up to resolution recommended. There has been interval development of a small right pleural effusion. The left lung remains clear. No pneumothorax. The cardiac silhouette is within normal limits. No acute osseous pathology. IMPRESSION: 1.  Worsening right upper lobe pneumonia. Follow-up to resolution recommended. 2. Interval development of a small right pleural effusion. 3. No pneumothorax. Electronically Signed   By: Anner Crete M.D.   On: 04/08/2019 10:14   Dg Chest 2 View  Result Date: 04/04/2019 CLINICAL DATA:  Patient feels like she is going to pass out. Nauseous for 2 days. EXAM: CHEST - 2 VIEW COMPARISON:  01/19/2011 FINDINGS: There is focal consolidation in the right upper lobe that is new from the prior study. This is consistent with pneumonia. No other areas of lung consolidation. Lungs are hyperexpanded. There is linear scarring or atelectasis at the bases. No evidence of pulmonary edema. No pleural effusion or pneumothorax. Cardiac silhouette is normal in size. No mediastinal or hilar masses. No evidence of adenopathy. Skeletal structures are intact. IMPRESSION: Right upper lobe consolidation consistent with pneumonia. Electronically Signed   By: Lajean Manes M.D.   On: 04/04/2019 16:25   US Liver Doppler  Result Date: 04/08/2019 CLINICAL DATA:  Elevated LFTs. EXAM: DUPLEX ULTRASOUND OF LIVER TECHNIQUE: Color and duplex Doppler ultrasound was performed to evaluate the hepatic in-flow and out-flow vessels. COMPARISON:  Abdominal MRI-03/29/2017; abdominal ultrasound-01/30/2017 FINDINGS: Liver: Normal parenchymal echogenicity. Normal hepatic contour without nodularity. The approximately 1.6 cm anechoic cyst within the subcapsular aspect the left lobe of the liver is unchanged compared to the 01/2017 examination. No new discrete hepatic lesions. No intrahepatic biliary ductal dilatation. Main Portal Vein size: 1.1 cm Normal velocities and directional flow are demonstrated throughout the hepatic vasculature as follows: Portal Vein Velocities Main Prox:  56 cm/sec Main Mid: 59 cm/sec Main Dist:  41 cm/sec Right: 15 cm/sec Left: 11 cm/sec Hepatic Vein Velocities Right:  30 cm/sec Middle:  35 cm/sec Left:  90 cm/sec IVC: Present  and patent with normal respiratory phasicity. Hepatic Artery Velocity:  92 cm/sec Splenic Vein Velocity:  25 cm/sec Spleen: 7 cm x 4 cm x 8 cm with a total volume of 121 cm^3 (411 cm^3 is upper limit normal) Portal Vein Occlusion/Thrombus: No Splenic Vein Occlusion/Thrombus: No Ascites: None Varices: None IMPRESSION: No explanation for patient's persistently elevated LFTs. Specifically, hepatic vasculature is widely patent with normal velocities and directional flow. Electronically Signed   By: Sandi Mariscal M.D.   On: 04/08/2019 07:10    Assessment & Plan    73 year old female with no prior cardiac history admitted with community acquired pneumonia. On empiric antibiotics. She also has hyperlipidemia and is on rosuvastatin. She was being treated with IV antibiotics for her pneumonia . Atrial flutter-likely secondary to her airspace disease. Volume status appears stable. Electrolytes and magnesium were normal. TSH was normal.   1. afib-Still in  afib with variable but fairly rapid  vr. Tolerating cardizem 30q6 hemodynamically. Will increase cardizem to 60 q6 and follow heart rate response and hemodynaics. CHADS2VASc score of2, 1 for age greater than 32 and 45 for female gender. Continue with eliquis.   Pneumonia-continue with current therapy.  Hyperlipidemia-was on rosuvastatin. Have d/ced at least temporarily due to elevated transaminases.    Signed, Javier Docker Chirag Krueger MD 04/09/2019, 12:47 PM  Pager: [phone]

## 2019-04-09 NOTE — Progress Notes (Signed)
Progress Note    Tamara Whitehead  CHE:[id number] DOB: 10-21-1945  DOA: 04/04/2019 PCP: Tamara Haven, MD      Brief Narrative:    Medical records reviewed and are as summarized below:  Tamara Whitehead is an 73 y.o. female with history of rheumatoid arthritis on Humira, osteoarthritis, recent use of prednisone, hypercholesterolemia, hypothyroidism,.  She presented to the hospital with vomiting, dizziness, unsteady gait, productive cough, pleuritic pain between the shoulder blades and exertional shortness of breath.  She was found to have sepsis secondary to right upper lobe pneumonia complicated by acute hypoxemic respiratory failure.  She developed atrial flutter/atrial fibrillation with rapid ventricular response.  She was seen in consultation by cardiologist and she has been started on oral Cardizem and Eliquis.  2D echo was unremarkable and EF was estimated at 60 to 65%.      Assessment/Plan:   Active Problems:   Community acquired pneumonia   Hyponatremia   Nausea and vomiting   Dehydration   Sepsis due to pneumonia (HCC)   Atrial flutter with rapid ventricular response (HCC)   Malnutrition of moderate degree     Sepsis secondary to community-acquired pneumonia in an immunocompromised patient: Change IV Zosyn to Augmentin  Atrial flutter/atrial fibrillation with rapid ventricular response: Continue Cardizem and Eliquis.  Follow-up with cardiologist for further recommendations.  CHa2Ds2VASC score of 2  Mild hyponatremia: Asymptomatic and stable.   Acute hypoxemic respiratory failure: Resolved.  She is tolerating room air.  Hypokalemia: Keep potassium level to at least 4 and magnesium level to 2 or greater  Dehydration/mild elevated creatinine: Improved  Nausea and vomiting: Improved.  Antiemetics as needed.  Elevated liver enzymes: She is off of rosuvastatin.  Liver ultrasound was unremarkable.  Hepatitis panel is pending.  Moderate protein calorie  malnutrition: Dietary supplements recommended.  Follow-up with dietitian.    Family Communication/Anticipated D/C date and plan/Code Status   DVT prophylaxis: Eliquis Code Status: Full code Family Communication: Plan discussed with the patient Disposition Plan: Home in 1 to 2 days depending on clinical improvement      Subjective:   She feels a lot better today.  No shortness of breath or chest pain.  She still has a cough but she thinks her cough is "breaking up".  She has been taking off of oxygen.    Objective:    Vitals:   04/09/19 0006 04/09/19 0332 04/09/19 0416 04/09/19 1118  BP: 112/74 122/86 129/76 121/85  Pulse: (!) 122 (!) 116 68 (!) 106  Resp: 18 18  18   Temp: 99.1 F (37.3 C) 99.6 F (37.6 C) 98.5 F (36.9 C) 99 F (37.2 C)  TempSrc: Oral Oral Oral Oral  SpO2: 91% 94% 93% 91%  Weight:      Height:        Intake/Output Summary (Last 24 hours) at 04/09/2019 1140 Last data filed at 04/09/2019 0900 Gross per 24 hour  Intake 643.83 ml  Output -  Net 643.83 ml   Filed Weights   04/05/19 1628  Weight: 47.8 kg    Exam:   GEN: NAD SKIN: No rash EYES: EOMI ENT: MMM CV: Irregular rate and rhythm, tachycardic PULM: Air entry adequate bilaterally, right basilar rales, no wheezing, ABD: soft, ND, NT, +BS CNS: AAO x 3, non focal EXT: No edema or tenderness      Data Reviewed:   I have personally reviewed following labs and imaging studies:  Labs: Labs show the following:   Basic  Metabolic Panel: Recent Labs  Lab 04/04/19 1530 04/05/19 0840 04/05/19 1601 04/06/19 0427 04/07/19 0405 04/08/19 0417  NA 131* 133*  --  132* 131* 133*  K 4.0 3.2*  --  3.7 3.7 3.8  CL 94* 101  --  101 99 101  CO2 23 21*  --  19* 24 23  GLUCOSE 150* 124*  --  90 92 121*  BUN 14 18  --  15 14 15   CREATININE 1.10* 1.02*  --  0.83 0.76 0.61  CALCIUM 9.3 8.0*  --  8.2* 7.9* 7.9*  MG  --   --  2.0  --   --  1.9   GFR Estimated Creatinine Clearance: 46.6  mL/min (by C-G formula based on SCr of 0.61 mg/dL). Liver Function Tests: Recent Labs  Lab 04/04/19 1530 04/05/19 0840 04/06/19 0427 04/07/19 0405 04/08/19 0417  AST 59* 143* 167* 210* 693*  ALT 60* 93* 110* 111* 274*  ALKPHOS 70 54 61 55 84  BILITOT 1.2 1.1 0.8 0.8 0.9  PROT 7.8 6.7 7.1 5.5* 5.3*  ALBUMIN 3.8 3.0* 3.0* 2.3* 2.1*   No results for input(s): LIPASE, AMYLASE in the last 168 hours. No results for input(s): AMMONIA in the last 168 hours. Coagulation profile Recent Labs  Lab 04/04/19 1530  INR 1.4*    CBC: Recent Labs  Lab 04/04/19 1530 04/05/19 0645 04/06/19 0427 04/08/19 0417  WBC 24.6* 15.9* 13.8* 7.5  NEUTROABS 23.2* 14.8* 12.7* 6.2  HGB 15.7* 13.6 15.2* 12.1  HCT 44.7 38.9 44.6 35.1*  MCV 92.5 92.2 93.5 91.6  PLT 156 127* 130* 104*   Cardiac Enzymes: No results for input(s): CKTOTAL, CKMB, CKMBINDEX, TROPONINI in the last 168 hours. BNP (last 3 results) No results for input(s): PROBNP in the last 8760 hours. CBG: No results for input(s): GLUCAP in the last 168 hours. D-Dimer: No results for input(s): DDIMER in the last 72 hours. Hgb A1c: No results for input(s): HGBA1C in the last 72 hours. Lipid Profile: No results for input(s): CHOL, HDL, LDLCALC, TRIG, CHOLHDL, LDLDIRECT in the last 72 hours. Thyroid function studies: Recent Labs    04/07/19 0405  TSH 1.853   Anemia work up: No results for input(s): VITAMINB12, FOLATE, FERRITIN, TIBC, IRON, RETICCTPCT in the last 72 hours. Sepsis Labs: Recent Labs  Lab 04/04/19 1530 04/05/19 0645 04/06/19 0427 04/08/19 0417  WBC 24.6* 15.9* 13.8* 7.5  LATICACIDVEN 1.8  --   --   --     Microbiology Recent Results (from the past 240 hour(s))  Culture, blood (Routine x 2)     Status: None   Collection Time: 04/04/19  3:28 PM   Specimen: BLOOD  Result Value Ref Range Status   Specimen Description BLOOD RIGHT ANTECUBITAL  Final   Special Requests   Final    BOTTLES DRAWN AEROBIC AND  ANAEROBIC Blood Culture adequate volume   Culture   Final    NO GROWTH 5 DAYS Performed at [facility], [address] [place], [place] [address]    Report Status 04/09/2019 FINAL  Final  Culture, blood (Routine x 2)     Status: None   Collection Time: 04/04/19  3:31 PM   Specimen: BLOOD  Result Value Ref Range Status   Specimen Description BLOOD LEFT ANTECUBITAL  Final   Special Requests   Final    BOTTLES DRAWN AEROBIC AND ANAEROBIC Blood Culture results may not be optimal due to an excessive volume of blood received in culture bottles  Culture   Final    NO GROWTH 5 DAYS Performed at [facility], [address] [place], [place] [address]    Report Status 04/09/2019 FINAL  Final  SARS CORONAVIRUS 2 (TAT 6-24 HRS) Nasopharyngeal Nasopharyngeal Swab     Status: None   Collection Time: 04/04/19  4:53 PM   Specimen: Nasopharyngeal Swab  Result Value Ref Range Status   SARS Coronavirus 2 NEGATIVE NEGATIVE Final    Comment: (NOTE) SARS-CoV-2 target nucleic acids are NOT DETECTED. The SARS-CoV-2 RNA is generally detectable in upper and lower respiratory specimens during the acute phase of infection. Negative results do not preclude SARS-CoV-2 infection, do not rule out co-infections with other pathogens, and should not be used as the sole basis for treatment or other patient management decisions. Negative results must be combined with clinical observations, patient history, and epidemiological information. The expected result is Negative. Fact Sheet for Patients: SugarRoll.be Fact Sheet for Healthcare Providers: https://www.woods-mathews.com/ This test is not yet approved or cleared by the [place] FDA and  has been authorized for detection and/or diagnosis of SARS-CoV-2 by FDA under an Emergency Use Authorization (EUA). This EUA will remain  in effect (meaning this test can be used) for the duration of the  COVID-19 declaration under Section 56 4(b)(1) of the Act, 21 U.S.C. section 360bbb-3(b)(1), unless the authorization is terminated or revoked sooner. Performed at [facility], [place] [address] [place], [place] [address]   Urine culture     Status: Abnormal   Collection Time: 04/04/19  7:00 PM   Specimen: Urine, Clean Catch  Result Value Ref Range Status   Specimen Description   Final    URINE, CLEAN CATCH Performed at [facility], [address] [place], [place] [address]    Special Requests   Final    Normal Performed at [facility], [address] [place], [place] [address]    Culture (A)  Final    <10,000 COLONIES/mL INSIGNIFICANT GROWTH Performed at [facility], [place] [address], [place], [place] [address]    Report Status 04/06/2019 FINAL  Final    Procedures and diagnostic studies:  Dg Chest 2 View  Result Date: 04/08/2019 CLINICAL DATA:  73 year old female with pneumonia. EXAM: CHEST - 2 VIEW COMPARISON:  Chest radiograph dated 04/04/2019. FINDINGS: Right upper lobe consolidation has increased in size since the radiograph of 04/04/2019 most consistent with worsening pneumonia. Clinical correlation and follow-up to resolution recommended. There has been interval development of a small right pleural effusion. The left lung remains clear. No pneumothorax. The cardiac silhouette is within normal limits. No acute osseous pathology. IMPRESSION: 1. Worsening right upper lobe pneumonia. Follow-up to resolution recommended. 2. Interval development of a small right pleural effusion. 3. No pneumothorax. Electronically Signed   By: Anner Crete M.D.   On: 04/08/2019 10:14    Medications:   . apixaban  5 mg Oral BID  . calcium carbonate  500 mg Oral Daily  . diltiazem  30 mg Oral Q6H  . feeding supplement (ENSURE ENLIVE)  237 mL Oral BID BM  . levothyroxine  50 mcg Oral QAC breakfast  . magnesium oxide  400 mg Oral BID  . senna-docusate  1  tablet Oral BID   Continuous Infusions: . sodium chloride 20 mL (04/09/19 0226)  . piperacillin-tazobactam 3.375 g (04/09/19 0940)     LOS: 5 days   Orlin Kann  [facility] Pager [phone].   *Please refer to  [email or web address], password [email or web address] to get updated schedule on who will round on this patient, as hospitalists switch teams weekly. If 7PM-7AM, please contact night-coverage at [email or web address], password [email or web address] for any overnight needs.  04/09/2019, 11:40 AM

## 2019-04-10 LAB — COMPREHENSIVE METABOLIC PANEL
ALT: 253 U/L — ABNORMAL HIGH (ref 0–44)
AST: 314 U/L — ABNORMAL HIGH (ref 15–41)
Albumin: 2.4 g/dL — ABNORMAL LOW (ref 3.5–5.0)
Alkaline Phosphatase: 138 U/L — ABNORMAL HIGH (ref 38–126)
Anion gap: 10 (ref 5–15)
BUN: 9 mg/dL (ref 8–23)
CO2: 27 mmol/L (ref 22–32)
Calcium: 8.1 mg/dL — ABNORMAL LOW (ref 8.9–10.3)
Chloride: 99 mmol/L (ref 98–111)
Creatinine, Ser: 0.66 mg/dL (ref 0.44–1.00)
GFR calc Af Amer: >60 mL/min (ref >60)
GFR calc non Af Amer: >60 mL/min (ref >60)
Glucose, Bld: 113 mg/dL — ABNORMAL HIGH (ref 70–99)
Potassium: 3.4 mmol/L — ABNORMAL LOW (ref 3.5–5.1)
Sodium: 136 mmol/L (ref 135–145)
Total Bilirubin: 1 mg/dL (ref 0.3–1.2)
Total Protein: 6.3 g/dL — ABNORMAL LOW (ref 6.5–8.1)

## 2019-04-10 LAB — HEPATITIS PANEL, ACUTE
HCV Ab: <0.1 s/co ratio — AB (ref 0.0–0.9)
Hep A IgM: NEGATIVE — AB
Hep B C IgM: NEGATIVE — AB
Hepatitis B Surface Ag: NEGATIVE — AB

## 2019-04-10 MED ORDER — APIXABAN 5 MG PO TABS: 5.0000 mg | ORAL_TABLET | Freq: Two times a day (BID) | ORAL | 3 refills | Status: DC

## 2019-04-10 MED ORDER — NYSTATIN 100000 UNIT/ML MT SUSP: 5.0000 mL | Freq: Four times a day (QID) | OROMUCOSAL | 0 refills | Status: DC

## 2019-04-10 MED ORDER — AZITHROMYCIN 250 MG PO TABS: 250.0000 mg | ORAL_TABLET | Freq: Every day | ORAL | 0 refills | Status: DC

## 2019-04-10 MED ORDER — CEFDINIR 300 MG PO CAPS: 300.0000 mg | ORAL_CAPSULE | Freq: Two times a day (BID) | ORAL | 0 refills | Status: DC

## 2019-04-10 MED ORDER — HYDROCODONE-HOMATROPINE 5-1.5 MG/5ML PO SYRP: 5.0000 mL | ORAL_SOLUTION | Freq: Four times a day (QID) | ORAL | 0 refills | Status: DC | PRN

## 2019-04-10 MED ORDER — DILTIAZEM HCL ER COATED BEADS 240 MG PO CP24: 240.0000 mg | ORAL_CAPSULE | Freq: Every day | ORAL | 3 refills | Status: DC

## 2019-04-10 MED ORDER — DOXYCYCLINE HYCLATE 100 MG PO CAPS: 100.0000 mg | ORAL_CAPSULE | Freq: Two times a day (BID) | ORAL | 0 refills | Status: DC

## 2019-04-10 MED ORDER — DILTIAZEM HCL ER COATED BEADS 240 MG PO CP24
240.0000 mg | ORAL_CAPSULE | Freq: Every day | ORAL | Status: DC
  Administered 2019-04-10: 240 mg via ORAL
  Filled 2019-04-10: qty 1

## 2019-04-10 MED ORDER — SODIUM CHLORIDE 0.9 % IV SOLN
1.0000 g | INTRAVENOUS | Status: DC
  Administered 2019-04-10: 1 g via INTRAVENOUS
  Filled 2019-04-10: qty 10
  Filled 2019-04-10: qty 1

## 2019-04-10 NOTE — Care Management Important Message (Signed)
Important Message  Patient Details  Name: Tamara Whitehead MRN: [id number] Date of Birth: May 10, 1946   Medicare Important Message Given:  Yes     Tamara Whitehead 04/10/2019, 1:22 PM

## 2019-04-10 NOTE — TOC Progression Note (Signed)
Transition of Care [facility]) - Progression Note    Patient Details  Name: Tamara Whitehead MRN: [id number] Date of Birth: 1945/06/29  Transition of Care [facility]) CM/SW Britt, RN Phone Number: 04/10/2019, 4:23 PM  Clinical Narrative:     Sent message to physician letting them know we needed a [id number]  Order and Home health RN order Expected Discharge Plan: [place] Barriers to Discharge: Barriers Resolved  Expected Discharge Plan and Services Expected Discharge Plan: [place]   Discharge Planning Services: CM Consult Post Acute Care Choice: [place] arrangements for the past 2 months: Single Family Home Expected Discharge Date: 04/10/19                         [facility] Arranged: RN [facility] Agency: [place] Date [place]: 04/10/19 Time [place] Agency Contacted: [phone] Representative spoke with at [place]: [place] ([place]) Interventions    Readmission Risk Interventions No flowsheet data found.

## 2019-04-10 NOTE — Discharge Summary (Signed)
Physician Discharge Summary  GRIFFIN DEWILDE CBJ:[id number] DOB: 08-10-1945 DOA: 04/04/2019  PCP: Leone Haven, MD  Admit date: 04/04/2019 Discharge date: 04/10/2019  Admitted From: Home  Disposition:  Home   Recommendations for Outpatient Follow-up:  1. Follow up with Dr. [name] in 1 week 2. Follow up with Dr. Ubaldo Glassing in 1-2 weeks regarding new atrial flutter 3. Dr. [name]: Please repeat CBC and LFTs in 1 week 4. Dr. [name]: If LFTs normal, please restart Crestor, if not, please follow up hepatitis panel from hospital and further work up as necessary 5. Dr. [name]: Please repeat CXR in 4-6 weeks      Home Health: RN for medication assistance given new atrial flutter, complicated new medical regimen Equipment/Devices: None  Discharge Condition: Good CODE STATUS: Full Diet recommendation: Regular      Brief/Interim Summary: Tamara Whitehead is a 73 y.o. F with hx HTN, RA on adalimumab who presented with vomiting, weakness, and pleuritic pain for few days.  In the ER, CXR showed RUL opacity, and she was febrile.  Started on empiric antibiotics for CAP.         PRINCIPAL HOSPITAL DIAGNOSIS: Lobar community-acquired pneumonia, right upper lobe    Discharge Diagnoses:   Lobar pneumonia, right upper lobe Patient was admitted and started on ceftriaxone.  Her fever resolved, her white blood cell count resolved.  Chest x-ray was repeated, and she was found to have worsening opacity, so her antibiotics were broadened to Zosyn for 1 day.    Patient now mentating at baseline, taking orals.  Temp < 100 F, heart rate < 100bpm, RR < 24, SpO2 improved.  Discharged to complete 4 more days of cefdinir and 2 more days doxycycline.  Given flutter valve, cough syrup. -Repeat chest x-ray in 4 to 6 weeks     Atrial flutter, new onset While in the hospital, patient developed atrial flutter overnight.  Her heart rate was labile.  Cardiology were consulted.  TSH  normal, echocardiogram showed normal EF, normal valves.  Her diltiazem was titrated up, her heart rate responded.  She was discharged with Eliquis and diltiazem with cardiology follow-up-in 1 week.     Transaminitis Patient noted to have mild transaminitis on admission, this increased to AST/ALT 693/274 U/L and decreased to 314/253 by discharge, with normal bilirubin.  Liver ultrasound unremarkable.  Hep A, hep C antibodies negative, hep B surface antigen negative.  Crestor and azithromycin stopped. -Needs follow-up LFTs with PCP  Thrush  Discharged with nystatin swish and swallow.  Hypertension Not on antihypertensives at home  Hyperlipidemia  Moderate protein calorie malnutrition As evidenced by moderate fat depletion, moderate to severe muscle depletion, and inadequate oral intake by history  Hypothyroidism  Hyponatremia Resolved  Thrombocytopenia               Discharge Instructions  Discharge Instructions    Diet general   Complete by: As directed    Discharge instructions   Complete by: As directed    You were admitted for pneumonia. You were treated with antibiotics.  You should finish the course of antibiotics as follows: Take cefdinir 300 mg twice daily for 4 more days (starting Thursday morning) Take doxycycline 100 mg twice daily for 4 more days starting today  Follow up with your primary care doctor Dr. [name] asap Have him check your blood work in 1 week, and repeat a Chest x-ray in 4 weeks  Use the green flutter valve to loosen phlegm You may also use a  cough syrup:  I recommend Robitussin with guiafenesin and dextromethorphan (this is over the counter at any pharmacy) If that is not strong enough, you may try the cough syrup you used here, Hycodan.   Beware this is an opiate cough syrup like Codeine, so do not drive while taking this medicine, and do not use alcohol with it.  If you feel you do not need something this strong, do not  fill the hycodan prescription   You were also found to have a new abnormal heart rhythm.   Take apixaban/Eliquis 5 mg twice daily from now on This is a blood thinner If you notice bloody bowel movements or black and sticky/tarry bowel movemetns, stop the Eliquis and call your doctor immediately  Also take diltiazem/Cardizem 240 mg daily  Follow up with Dr. Ubaldo Glassing in 1 week    Lastly, we noticed that your liver function tests were elevated while you were here.  Do not take your Crestor for now. Have Dr. [name] recheck your liver tests. Ask him if you should restart Crestor or not   Increase activity slowly   Complete by: As directed      Allergies as of 04/10/2019      Reactions   Codeine Nausea And Vomiting   Can take cough syrups      Medication List    STOP taking these medications   rosuvastatin 40 MG tablet Commonly known as: Crestor     TAKE these medications   Adalimumab 40 MG/0.8ML Pnkt Inject 40 mg into the skin every 14 (fourteen) days.   Aleve 220 MG tablet Generic drug: naproxen sodium Take 220 mg by mouth 2 (two) times daily with a meal.   apixaban 5 MG Tabs tablet Commonly known as: ELIQUIS Take 1 tablet (5 mg total) by mouth 2 (two) times daily.   CALCIUM 600 PO Take 600 mg by mouth daily.   cefdinir 300 MG capsule Commonly known as: OMNICEF Take 1 capsule (300 mg total) by mouth 2 (two) times daily.   Coenzyme Q-10 100 MG capsule Take 100 mg by mouth daily.   diltiazem 240 MG 24 hr capsule Commonly known as: CARDIZEM CD Take 1 capsule (240 mg total) by mouth daily.   doxycycline 100 MG capsule Commonly known as: VIBRAMYCIN Take 1 capsule (100 mg total) by mouth 2 (two) times daily.   HYDROcodone-homatropine 5-1.5 MG/5ML syrup Commonly known as: HYCODAN Take 5 mLs by mouth every 6 (six) hours as needed for cough.   levothyroxine 50 MCG tablet Commonly known as: SYNTHROID Take 1 tablet (50 mcg total) by mouth daily before  breakfast.   nystatin 100000 UNIT/ML suspension Commonly known as: MYCOSTATIN Take 5 mLs (500,000 Units total) by mouth 4 (four) times daily. Swish around the mouth and then swallow.      Follow-up Information    Teodoro Spray, MD On 04/17/2019.   Specialty: Cardiology Why: @ 11:45 am Contact information: [place] [address] [phone]        Leone Haven, MD. Schedule an appointment as soon as possible for a visit on 05/03/2019.   Specialty: Family Medicine Why: @ 11:30 am Contact information: [address] [place] [address] [phone]          Allergies  Allergen Reactions  . Codeine Nausea And Vomiting    Can take cough syrups    Consultations:  Cardiology   Procedures/Studies: Dg Chest 2 View  Result Date: 04/08/2019 CLINICAL DATA:  73 year old female with pneumonia. EXAM: CHEST - 2 VIEW COMPARISON:  Chest radiograph dated 04/04/2019. FINDINGS: Right upper lobe consolidation has increased in size since the radiograph of 04/04/2019 most consistent with worsening pneumonia. Clinical correlation and follow-up to resolution recommended. There has been interval development of a small right pleural effusion. The left lung remains clear. No pneumothorax. The cardiac silhouette is within normal limits. No acute osseous pathology. IMPRESSION: 1. Worsening right upper lobe pneumonia. Follow-up to resolution recommended. 2. Interval development of a small right pleural effusion. 3. No pneumothorax. Electronically Signed   By: Anner Crete M.D.   On: 04/08/2019 10:14   Dg Chest 2 View  Result Date: 04/04/2019 CLINICAL DATA:  Patient feels like she is going to pass out. Nauseous for 2 days. EXAM: CHEST - 2 VIEW COMPARISON:  01/19/2011 FINDINGS: There is focal consolidation in the right upper lobe that is new from the prior study. This is consistent with pneumonia. No other areas of lung consolidation. Lungs are  hyperexpanded. There is linear scarring or atelectasis at the bases. No evidence of pulmonary edema. No pleural effusion or pneumothorax. Cardiac silhouette is normal in size. No mediastinal or hilar masses. No evidence of adenopathy. Skeletal structures are intact. IMPRESSION: Right upper lobe consolidation consistent with pneumonia. Electronically Signed   By: Lajean Manes M.D.   On: 04/04/2019 16:25   US Liver Doppler  Result Date: 04/08/2019 CLINICAL DATA:  Elevated LFTs. EXAM: DUPLEX ULTRASOUND OF LIVER TECHNIQUE: Color and duplex Doppler ultrasound was performed to evaluate the hepatic in-flow and out-flow vessels. COMPARISON:  Abdominal MRI-03/29/2017; abdominal ultrasound-01/30/2017 FINDINGS: Liver: Normal parenchymal echogenicity. Normal hepatic contour without nodularity. The approximately 1.6 cm anechoic cyst within the subcapsular aspect the left lobe of the liver is unchanged compared to the 01/2017 examination. No new discrete hepatic lesions. No intrahepatic biliary ductal dilatation. Main Portal Vein size: 1.1 cm Normal velocities and directional flow are demonstrated throughout the hepatic vasculature as follows: Portal Vein Velocities Main Prox:  56 cm/sec Main Mid: 59 cm/sec Main Dist:  41 cm/sec Right: 15 cm/sec Left: 11 cm/sec Hepatic Vein Velocities Right:  30 cm/sec Middle:  35 cm/sec Left:  90 cm/sec IVC: Present and patent with normal respiratory phasicity. Hepatic Artery Velocity:  92 cm/sec Splenic Vein Velocity:  25 cm/sec Spleen: 7 cm x 4 cm x 8 cm with a total volume of 121 cm^3 (411 cm^3 is upper limit normal) Portal Vein Occlusion/Thrombus: No Splenic Vein Occlusion/Thrombus: No Ascites: None Varices: None IMPRESSION: No explanation for patient's persistently elevated LFTs. Specifically, hepatic vasculature is widely patent with normal velocities and directional flow. Electronically Signed   By: Sandi Mariscal M.D.   On: 04/08/2019 07:10      Subjective: No dyspnea, dizziness  with ambulation, no chest pain.  No fever.  She still has some residual cough.  No palpitations or exertional symptoms.  Discharge Exam: Vitals:   04/10/19 0337 04/10/19 0933  BP: 129/60 134/78  Pulse: 83 (!) 107  Resp:  18  Temp: 97.9 F (36.6 C)   SpO2: 91% 97%   Vitals:   04/09/19 2151 04/10/19 0001 04/10/19 0337 04/10/19 0933  BP: (!) 142/85  129/60 134/78  Pulse: (!) 112 92 83 (!) 107  Resp:    18  Temp: 98.9 F (37.2 C)  97.9 F (36.6 C)   TempSrc: Oral  Oral   SpO2: 94%  91% 97%  Weight:      Height:  General: Pt is alert, awake, not in acute distress, sitting up in bed. Cardiovascular: Regular rate, irregular rhythm, nl S1-S2, no murmurs appreciated.   No LE edema.   Respiratory: Normal respiratory rate and rhythm.  CTAB without rales or wheezes. Abdominal: Abdomen soft and non-tender.  No distension or HSM.   Neuro/Psych: Strength symmetric in upper and lower extremities.  Judgment and insight appear normal.   The results of significant diagnostics from this hospitalization (including imaging, microbiology, ancillary and laboratory) are listed below for reference.     Microbiology: Recent Results (from the past 240 hour(s))  Culture, blood (Routine x 2)     Status: None   Collection Time: 04/04/19  3:28 PM   Specimen: BLOOD  Result Value Ref Range Status   Specimen Description BLOOD RIGHT ANTECUBITAL  Final   Special Requests   Final    BOTTLES DRAWN AEROBIC AND ANAEROBIC Blood Culture adequate volume   Culture   Final    NO GROWTH 5 DAYS Performed at [facility], [address] [place], [place] [address]    Report Status 04/09/2019 FINAL  Final  Culture, blood (Routine x 2)     Status: None   Collection Time: 04/04/19  3:31 PM   Specimen: BLOOD  Result Value Ref Range Status   Specimen Description BLOOD LEFT ANTECUBITAL  Final   Special Requests   Final    BOTTLES DRAWN AEROBIC AND ANAEROBIC Blood Culture results may not be optimal  due to an excessive volume of blood received in culture bottles   Culture   Final    NO GROWTH 5 DAYS Performed at [facility], [address], [place], [place] [address]    Report Status 04/09/2019 FINAL  Final  SARS CORONAVIRUS 2 (TAT 6-24 HRS) Nasopharyngeal Nasopharyngeal Swab     Status: None   Collection Time: 04/04/19  4:53 PM   Specimen: Nasopharyngeal Swab  Result Value Ref Range Status   SARS Coronavirus 2 NEGATIVE NEGATIVE Final    Comment: (NOTE) SARS-CoV-2 target nucleic acids are NOT DETECTED. The SARS-CoV-2 RNA is generally detectable in upper and lower respiratory specimens during the acute phase of infection. Negative results do not preclude SARS-CoV-2 infection, do not rule out co-infections with other pathogens, and should not be used as the sole basis for treatment or other patient management decisions. Negative results must be combined with clinical observations, patient history, and epidemiological information. The expected result is Negative. Fact Sheet for Patients: SugarRoll.be Fact Sheet for Healthcare Providers: https://www.woods-mathews.com/ This test is not yet approved or cleared by the [place] FDA and  has been authorized for detection and/or diagnosis of SARS-CoV-2 by FDA under an Emergency Use Authorization (EUA). This EUA will remain  in effect (meaning this test can be used) for the duration of the COVID-19 declaration under Section 56 4(b)(1) of the Act, 21 U.S.C. section 360bbb-3(b)(1), unless the authorization is terminated or revoked sooner. Performed at [facility], [place] [address] [place], [place] [address]   Urine culture     Status: Abnormal   Collection Time: 04/04/19  7:00 PM   Specimen: Urine, Clean Catch  Result Value Ref Range Status   Specimen Description   Final    URINE, CLEAN CATCH Performed at [facility], [address] [place], [place]  [address]    Special Requests   Final    Normal Performed at [facility], [address] [place], [place] [address]  Culture (A)  Final    <10,000 COLONIES/mL INSIGNIFICANT GROWTH Performed at [place] [address] [place] [address] [place], [place] [address]    Report Status 04/06/2019 FINAL  Final     Labs: BNP (last 3 results) No results for input(s): BNP in the last 8760 hours. Basic Metabolic Panel: Recent Labs  Lab 04/05/19 0840 04/05/19 1601 04/06/19 0427 04/07/19 0405 04/08/19 0417 04/10/19 0330  NA 133*  --  132* 131* 133* 136  K 3.2*  --  3.7 3.7 3.8 3.4*  CL 101  --  101 99 101 99  CO2 21*  --  19* 24 23 27   GLUCOSE 124*  --  90 92 121* 113*  BUN 18  --  15 14 15 9   CREATININE 1.02*  --  0.83 0.76 0.61 0.66  CALCIUM 8.0*  --  8.2* 7.9* 7.9* 8.1*  MG  --  2.0  --   --  1.9  --    Liver Function Tests: Recent Labs  Lab 04/05/19 0840 04/06/19 0427 04/07/19 0405 04/08/19 0417 04/10/19 0330  AST 143* 167* 210* 693* 314*  ALT 93* 110* 111* 274* 253*  ALKPHOS 54 61 55 84 138*  BILITOT 1.1 0.8 0.8 0.9 1.0  PROT 6.7 7.1 5.5* 5.3* 6.3*  ALBUMIN 3.0* 3.0* 2.3* 2.1* 2.4*   No results for input(s): LIPASE, AMYLASE in the last 168 hours. No results for input(s): AMMONIA in the last 168 hours. CBC: Recent Labs  Lab 04/04/19 1530 04/05/19 0645 04/06/19 0427 04/08/19 0417  WBC 24.6* 15.9* 13.8* 7.5  NEUTROABS 23.2* 14.8* 12.7* 6.2  HGB 15.7* 13.6 15.2* 12.1  HCT 44.7 38.9 44.6 35.1*  MCV 92.5 92.2 93.5 91.6  PLT 156 127* 130* 104*   Cardiac Enzymes: No results for input(s): CKTOTAL, CKMB, CKMBINDEX, TROPONINI in the last 168 hours. BNP: Invalid input(s): POCBNP CBG: No results for input(s): GLUCAP in the last 168 hours. D-Dimer No results for input(s): DDIMER in the last 72 hours. Hgb A1c No results for input(s): HGBA1C in the last 72 hours. Lipid Profile No results for input(s): CHOL, HDL, LDLCALC, TRIG, CHOLHDL, LDLDIRECT in the last 72  hours. Thyroid function studies No results for input(s): TSH, T4TOTAL, T3FREE, THYROIDAB in the last 72 hours.  Invalid input(s): FREET3 Anemia work up No results for input(s): VITAMINB12, FOLATE, FERRITIN, TIBC, IRON, RETICCTPCT in the last 72 hours. Urinalysis    Component Value Date/Time   COLORURINE YELLOW (A) 04/04/2019 1900   APPEARANCEUR CLOUDY (A) 04/04/2019 1900   LABSPEC 1.021 04/04/2019 1900   PHURINE 5.0 04/04/2019 1900   GLUCOSEU NEGATIVE 04/04/2019 1900   HGBUR SMALL (A) 04/04/2019 1900   BILIRUBINUR NEGATIVE 04/04/2019 1900   KETONESUR NEGATIVE 04/04/2019 1900   PROTEINUR 100 (A) 04/04/2019 1900   NITRITE NEGATIVE 04/04/2019 1900   LEUKOCYTESUR NEGATIVE 04/04/2019 1900   Sepsis Labs Invalid input(s): PROCALCITONIN,  WBC,  LACTICIDVEN Microbiology Recent Results (from the past 240 hour(s))  Culture, blood (Routine x 2)     Status: None   Collection Time: 04/04/19  3:28 PM   Specimen: BLOOD  Result Value Ref Range Status   Specimen Description BLOOD RIGHT ANTECUBITAL  Final   Special Requests   Final    BOTTLES DRAWN AEROBIC AND ANAEROBIC Blood Culture adequate volume   Culture   Final    NO GROWTH 5 DAYS Performed at [facility], [address] [place], [place] [address]    Report Status 04/09/2019 FINAL  Final  Culture, blood (Routine x 2)     Status: None   Collection Time: 04/04/19  3:31 PM   Specimen: BLOOD  Result Value Ref Range Status   Specimen Description BLOOD LEFT ANTECUBITAL  Final   Special Requests   Final    BOTTLES DRAWN AEROBIC AND ANAEROBIC Blood Culture results may not be optimal due to an excessive volume of blood received in culture bottles   Culture   Final    NO GROWTH 5 DAYS Performed at [facility], [address], [place], [place] [address]    Report Status 04/09/2019 FINAL  Final  SARS CORONAVIRUS 2 (TAT 6-24 HRS) Nasopharyngeal Nasopharyngeal Swab     Status: None   Collection Time: 04/04/19  4:53 PM    Specimen: Nasopharyngeal Swab  Result Value Ref Range Status   SARS Coronavirus 2 NEGATIVE NEGATIVE Final    Comment: (NOTE) SARS-CoV-2 target nucleic acids are NOT DETECTED. The SARS-CoV-2 RNA is generally detectable in upper and lower respiratory specimens during the acute phase of infection. Negative results do not preclude SARS-CoV-2 infection, do not rule out co-infections with other pathogens, and should not be used as the sole basis for treatment or other patient management decisions. Negative results must be combined with clinical observations, patient history, and epidemiological information. The expected result is Negative. Fact Sheet for Patients: SugarRoll.be Fact Sheet for Healthcare Providers: https://www.woods-mathews.com/ This test is not yet approved or cleared by the [place] FDA and  has been authorized for detection and/or diagnosis of SARS-CoV-2 by FDA under an Emergency Use Authorization (EUA). This EUA will remain  in effect (meaning this test can be used) for the duration of the COVID-19 declaration under Section 56 4(b)(1) of the Act, 21 U.S.C. section 360bbb-3(b)(1), unless the authorization is terminated or revoked sooner. Performed at [facility], [place] [address], [place], [place] [address]   Urine culture     Status: Abnormal   Collection Time: 04/04/19  7:00 PM   Specimen: Urine, Clean Catch  Result Value Ref Range Status   Specimen Description   Final    URINE, CLEAN CATCH Performed at [facility], [address], [place], [place] [address]    Special Requests   Final    Normal Performed at [facility], [address] [place], [place] [address]    Culture (A)  Final    <10,000 COLONIES/mL INSIGNIFICANT GROWTH Performed at [facility], [place] [address] [place], [place] [address]    Report Status 04/06/2019 FINAL  Final     Time coordinating discharge: 40  minutes The [place] controlled substances registry was reviewed for this patient prior to filling the <5 days supply controlled substances script.      SIGNED:   Edwin Dada, MD  [facility] 04/10/2019, 3:29 PM

## 2019-04-10 NOTE — TOC Initial Note (Signed)
Transition of Care [facility]) - Initial/Assessment Note    Patient Details  Name: Tamara Whitehead MRN: [id number] Date of Birth: Mar 20, 1946  Transition of Care [facility]) CM/SW Contact:    Shelbie Hutching, RN Phone Number: 04/10/2019, 1:49 PM  Clinical Narrative:                 Patient admitted with community acquired pneumonia.  Patient is medically stable for discharge today.  Patient is from home where she lives with her son.  Patient reports that her son will pick her up this afternoon.  Patient is independent at home and drives.  Patient is current with PCP and gets prescriptions from [facility] [address] in [place].   Patient is starting on new Eliquis and will benefit from home health RN coming out.  Patient agrees to home health and has no preference in agency.  Jana Half with [facility] accepted referral for nursing.  Patient requires no equipment.    Expected Discharge Plan: [place] Barriers to Discharge: Barriers Resolved   Patient Goals and CMS Choice   CMS Medicare.gov Compare Post Acute Care list provided to:: Patient Choice offered to / list presented to : Patient  Expected Discharge Plan and Services Expected Discharge Plan: [place]   Discharge Planning Services: CM Consult Post Acute Care Choice: [place] arrangements for the past 2 months: [place] Expected Discharge Date: 04/10/19                         HH Arranged: RN [place] Agency: [facility] Date [facility] Agency Contacted: 04/10/19 Time [place] Agency Contacted: [place] Representative spoke with at [place]: Jana Half  Prior Living Arrangements/Services Living arrangements for the past 2 months: [place] with:: Self Patient language and need for interpreter reviewed:: Yes Do you feel safe going back to the place where you live?: Yes      Need for Family Participation in Patient Care: Yes (Comment) Care giver support system  in place?: Yes (comment)(son)   Criminal Activity/Legal Involvement Pertinent to Current Situation/Hospitalization: No - Comment as needed  Activities of Daily Living Home Assistive Devices/Equipment: None ADL Screening (condition at time of admission) Patient's cognitive ability adequate to safely complete daily activities?: Yes Is the patient deaf or have difficulty hearing?: No Does the patient have difficulty seeing, even when wearing glasses/contacts?: No Does the patient have difficulty concentrating, remembering, or making decisions?: No Patient able to express need for assistance with ADLs?: No Does the patient have difficulty dressing or bathing?: Yes Independently performs ADLs?: Yes (appropriate for developmental age) Does the patient have difficulty walking or climbing stairs?: No Weakness of Legs: None Weakness of Arms/Hands: None  Permission Sought/Granted Permission sought to share information with : Case Manager, Other (comment) Permission granted to share information with : Yes, Verbal Permission Granted     Permission granted to share info w AGENCY: [facility]        Emotional Assessment Appearance:: Appears stated age Attitude/Demeanor/Rapport: Engaged Affect (typically observed): Accepting Orientation: : Oriented to Self, Oriented to Place, Oriented to  Time, Oriented to Situation Alcohol / Substance Use: Not Applicable Psych Involvement: No (comment)  Admission diagnosis:  Immunosuppressed status ([place]) [D84.9] Sepsis due to pneumonia ([place]) [J18.9, A41.9] Patient Active Problem List   Diagnosis Date Noted  . Malnutrition of moderate degree 04/08/2019  . Atrial flutter with rapid ventricular response ([place])   .  Sepsis due to pneumonia ([place])   . Community acquired pneumonia 04/04/2019  . Hyponatremia 04/04/2019  . Nausea and vomiting 04/04/2019  . Dehydration 04/04/2019  . Immunosuppressed status ([place])   . Skin lesion 11/07/2016  . Allergic rhinitis  01/27/2015  . Current tobacco use 01/27/2015  . Elevated blood-pressure reading without diagnosis of hypertension 01/27/2015  . Leukocytosis 01/27/2015  . Hypercholesteremia 01/27/2015  . Adult hypothyroidism 01/27/2015  . Cannot sleep 01/27/2015  . Osteoarthrosis 01/27/2015  . Malignant neoplasm of skin 01/27/2015  . Elevated liver enzymes 01/27/2015  . Thrombocytopenia ([place]) 10/07/2013  . Rheumatoid arthritis ([place]) 10/22/2012   PCP:  Leone Haven, MD Pharmacy:   [place], [address] [place] [address] Phone: [phone] Fax: [phone]  [id number] [address], [address] [place] [address] Phone: [phone] Fax: [phone]  [facility] #[id number] [address], [place] - [address] [place] [address] Phone: [phone] Fax: [phone]     Social Determinants of Health (SDOH) Interventions    Readmission Risk Interventions No flowsheet data found.

## 2019-04-10 NOTE — Progress Notes (Signed)
Discharge instructions given and went over with patient at bedside. All questions answered. Patient discharged home. Cyrus Ramsburg S, RN  

## 2019-04-10 NOTE — TOC Transition Note (Signed)
Transition of Care [facility]) - CM/SW Discharge Note   Patient Details  Name: Tamara Whitehead MRN: [id number] Date of Birth: Sep 29, 1945  Transition of Care [facility]) CM/SW Contact:  Shelbie Hutching, RN Phone Number: 04/10/2019, 2:07 PM   Clinical Narrative:    Patient to discharge home with [facility] home health RN.  Patient will be on new Eliquis.  Eliquis free 30 day coupon given to patient.     Final next level of care: Home w Home Health Services Barriers to Discharge: Barriers Resolved   Patient Goals and CMS Choice   CMS Medicare.gov Compare Post Acute Care list provided to:: Patient Choice offered to / list presented to : Patient  Discharge Placement                       Discharge Plan and Services   Discharge Planning Services: CM Consult Post Acute Care Choice: Home Health                    HH Arranged: RN [facility] Agency: [facility] Date [place]: 04/10/19 Time [place] Agency Contacted: [phone] Representative spoke with at [place]: [place] ([place]) Interventions     Readmission Risk Interventions No flowsheet data found.

## 2019-04-11 ENCOUNTER — Telehealth: Payer: Self-pay

## 2019-04-11 NOTE — Telephone Encounter (Signed)
Unable to reach patient for transitional care management. Will follow as appropriate.

## 2019-04-12 NOTE — Telephone Encounter (Signed)
Second attempt to reach patient for transitional care management. No answer. Unable to leave message. Will follow as appropriate.

## 2019-04-15 NOTE — Telephone Encounter (Addendum)
Third attempt to reach patient for transitional care management. No answer. Will follow.

## 2019-04-16 NOTE — Telephone Encounter (Signed)
Fourth attempt to reach patient for transitional care management. No answer. Patient is scheduled with PCP for hospital follow up on 05/03/19 @ 1130. If patient calls the office back please schedule with another provider in office at a sooner date. Needs repeat labs. Ok for [facility] to read to patient.

## 2019-05-02 ENCOUNTER — Telehealth: Payer: Self-pay | Admitting: Family Medicine

## 2019-05-02 NOTE — Telephone Encounter (Signed)
I called pt twice and unable to leave vm due to vm not set up and I sent a Mychart msg.

## 2019-05-03 ENCOUNTER — Other Ambulatory Visit: Payer: Self-pay

## 2019-05-03 ENCOUNTER — Ambulatory Visit (INDEPENDENT_AMBULATORY_CARE_PROVIDER_SITE_OTHER): Payer: Medicare HMO | Admitting: Family Medicine

## 2019-05-03 ENCOUNTER — Encounter: Payer: Self-pay | Admitting: Family Medicine

## 2019-05-03 VITALS — Ht 61.0 in | Wt 97.8 lb

## 2019-05-03 DIAGNOSIS — J189 Pneumonia, unspecified organism: Secondary | ICD-10-CM | POA: Diagnosis not present

## 2019-05-03 DIAGNOSIS — I4892 Unspecified atrial flutter: Secondary | ICD-10-CM

## 2019-05-03 DIAGNOSIS — R945 Abnormal results of liver function studies: Secondary | ICD-10-CM | POA: Diagnosis not present

## 2019-05-03 DIAGNOSIS — E440 Moderate protein-calorie malnutrition: Secondary | ICD-10-CM

## 2019-05-03 DIAGNOSIS — R7989 Other specified abnormal findings of blood chemistry: Secondary | ICD-10-CM | POA: Insufficient documentation

## 2019-05-03 NOTE — Progress Notes (Signed)
Virtual Visit via telephone Note  This visit type was conducted due to national recommendations for restrictions regarding the COVID-19 pandemic (e.g. social distancing).  This format is felt to be most appropriate for this patient at this time.  All issues noted in this document were discussed and addressed.  No physical exam was performed (except for noted visual exam findings with Video Visits).   I connected with Tamara Whitehead today at 11:30 AM EST by telephone and verified that I am speaking with the correct person using two identifiers. Location patient: home Location provider: home office Persons participating in the virtual visit: patient, provider, Keaghan Bowens (son)  I discussed the limitations, risks, security and privacy concerns of performing an evaluation and management service by telephone and the availability of in person appointments. I also discussed with the patient that there may be a patient responsible charge related to this service. The patient expressed understanding and agreed to proceed.  Interactive audio and video telecommunications were attempted between this provider and patient, however failed, due to patient having technical difficulties OR patient did not have access to video capability.  We continued and completed visit with audio only.   Reason for visit: follow-up  HPI: Pneumonia: Patient was hospitalized for pneumonia.  She was treated with IV antibiotics and discharged on doxycycline and cefdinir.  She finished the course of antibiotics at home.  She had some mild diarrhea with that though that has cleared up at this point.  She notes the first couple of weeks of being home were rough though she turned a corner this week and has started to feel increasingly better.  Her weakness is improving.  Lightheadedness is almost resolved all the way.  Stamina is not quite all the way back to normal though is improving.  She is drinking sips of fluid frequently though  reports she is mostly drinking tea and coffee that are caffeinated.  She wonders if she can restart her Humira as it has been on hold since she had the pneumonia.  She did have a spike in her LFTs and they held her Crestor.  She needs repeat labs and a chest x-ray.  Atrial flutter: Diagnosed with this in the hospital.  She was started on Cardizem and Eliquis.  She could not afford the Eliquis after discharge and has not been taking this.  She has been taking a baby aspirin.  No palpitations.  No chest pain.  Moderate protein calorie malnutrition: Patient notes she eats a very healthy diet typically.  She had quite a bit of stress prior to getting ill and notes that led to some weight loss.  She notes additional weight loss with her illness and hospitalization.  She has gained a couple of pounds back since discharge.   ROS: See pertinent positives and negatives per HPI.  Past Medical History:  Diagnosis Date  . Arthritis    Osteoarthritis/Rheumatoid  . Cancer (HCC)    Skin  . Cataract   . Elevated blood pressure   . Fibrocystic breast disease   . Glaucoma   . Hypercholesterolemia   . Thyroid disease    Hypothyroid    Past Surgical History:  Procedure Laterality Date  . ABDOMINAL HYSTERECTOMY     Total abd. hysterectomu and bilateral salping-oophorectomy  . EYE SURGERY Right    Glaucoma surgery  . HEMORRHOID SURGERY    . status post dilatation and curettage  1975  . TONSILLECTOMY    . TUBAL LIGATION  Family History  Problem Relation Age of Onset  . CAD Mother   . Arthritis Mother        Rheumatoid Arthritis  . Arthritis Father        Rheumatoid/Osteoarthritis and Blindness, bleeding Tendencies,Glaucoma  . CAD Father   . Hypertension Maternal Grandmother   . COPD Neg Hx     SOCIAL HX: Smoker   Current Outpatient Medications:  .  Adalimumab 40 MG/0.8ML PNKT, Inject 40 mg into the skin every 14 (fourteen) days., Disp: , Rfl:  .  apixaban (ELIQUIS) 5 MG TABS tablet,  Take 1 tablet (5 mg total) by mouth 2 (two) times daily., Disp: 60 tablet, Rfl: 3 .  Calcium Carbonate (CALCIUM 600 PO), Take 600 mg by mouth daily., Disp: , Rfl:  .  cefdinir (OMNICEF) 300 MG capsule, Take 1 capsule (300 mg total) by mouth 2 (two) times daily., Disp: 8 capsule, Rfl: 0 .  Coenzyme Q-10 100 MG capsule, Take 100 mg by mouth daily., Disp: , Rfl:  .  diltiazem (CARDIZEM CD) 240 MG 24 hr capsule, Take 1 capsule (240 mg total) by mouth daily., Disp: 30 capsule, Rfl: 3 .  doxycycline (VIBRAMYCIN) 100 MG capsule, Take 1 capsule (100 mg total) by mouth 2 (two) times daily., Disp: 8 capsule, Rfl: 0 .  HYDROcodone-homatropine (HYCODAN) 5-1.5 MG/5ML syrup, Take 5 mLs by mouth every 6 (six) hours as needed for cough., Disp: 60 mL, Rfl: 0 .  levothyroxine (SYNTHROID) 50 MCG tablet, Take 1 tablet (50 mcg total) by mouth daily before breakfast., Disp: 90 tablet, Rfl: 1 .  naproxen sodium (ALEVE) 220 MG tablet, Take 220 mg by mouth 2 (two) times daily with a meal., Disp: , Rfl:  .  nystatin (MYCOSTATIN) 100000 UNIT/ML suspension, Take 5 mLs (500,000 Units total) by mouth 4 (four) times daily. Swish around the mouth and then swallow., Disp: 240 mL, Rfl: 0  EXAM: This was a telehealth telephone visit and thus no physical exam was completed.  ASSESSMENT AND PLAN:  Discussed the following assessment and plan:  Atrial flutter with rapid ventricular response (HCC) Discussed the importance of being on anticoagulation to reduce the risk of stroke.  Her CHA2DS2-VASc score is 2 and that she meets criteria for anticoagulation.  She would prefer not to try warfarin and thus that leaves [name] with Xarelto or Eliquis which can be cost prohibitive.  She cannot afford the Eliquis at this time.  We will refer her to our clinical pharmacist to determine if she can qualify for patient assistance.  Community acquired pneumonia Patient is progressively recovering.  Discussed staying adequately hydrated and drinking  water as opposed to caffeinated beverages.  She will monitor the lightheadedness and I suspect that we will continue to improve with adequate hydration.  We will recheck a chest x-ray and lab work early next week.  She will wait on her chest x-ray and lab results prior to restarting Humira.  LFTs abnormal Likely related to her illness.  We will recheck these with lab work next week and then consider restarting Crestor.  Malnutrition of moderate degree Weight loss likely related to recent illness and prior stress.  Discussed high calorie food intake and monitoring her weight.    I discussed the assessment and treatment plan with the patient. The patient was provided an opportunity to ask questions and all were answered. The patient agreed with the plan and demonstrated an understanding of the instructions.   The patient was advised to call back or  seek an in-person evaluation if the symptoms worsen or if the condition fails to improve as anticipated.  I provided 28 minutes of non-face-to-face time during this encounter.   Tommi Rumps, MD

## 2019-05-03 NOTE — Assessment & Plan Note (Addendum)
Patient is progressively recovering.  Discussed staying adequately hydrated and drinking water as opposed to caffeinated beverages.  She will monitor the lightheadedness and I suspect that we will continue to improve with adequate hydration.  We will recheck a chest x-ray and lab work early next week.  She will wait on her chest x-ray and lab results prior to restarting Humira.

## 2019-05-03 NOTE — Assessment & Plan Note (Signed)
Likely related to her illness.  We will recheck these with lab work next week and then consider restarting Crestor.

## 2019-05-03 NOTE — Assessment & Plan Note (Signed)
Weight loss likely related to recent illness and prior stress.  Discussed high calorie food intake and monitoring her weight.

## 2019-05-03 NOTE — Assessment & Plan Note (Signed)
Discussed the importance of being on anticoagulation to reduce the risk of stroke.  Her CHA2DS2-VASc score is 2 and that she meets criteria for anticoagulation.  She would prefer not to try warfarin and thus that leaves [name] with Xarelto or Eliquis which can be cost prohibitive.  She cannot afford the Eliquis at this time.  We will refer her to our clinical pharmacist to determine if she can qualify for patient assistance.

## 2019-05-06 ENCOUNTER — Telehealth: Payer: Self-pay | Admitting: Family Medicine

## 2019-05-06 NOTE — Chronic Care Management (AMB) (Signed)
  Chronic Care Management   Outreach Note  05/06/2019 Name: Tamara Whitehead MRN: [id number] DOB: September 11, 1945  Tamara Whitehead is a 73 y.o. year old female who is a primary care patient of [name], Angela Adam, MD. I reached out to Karen Kays by phone today in response to a referral sent by Ms. Elizabeth Sauer Mcewan's PCP, Dr. Tommi Rumps     An unsuccessful telephone outreach was attempted today. The patient was referred to the case management team by for assistance with care management and care coordination.   Follow Up Plan: The care management team will reach out to the patient again over the next 7 days.   Glenna Durand, LPN Health Advisor, Embedded Care Coordination [place] Care Management ??[email or web address] ??[phone]

## 2019-05-07 ENCOUNTER — Ambulatory Visit: Payer: Medicare HMO | Admitting: Family Medicine

## 2019-05-07 ENCOUNTER — Telehealth: Payer: Self-pay | Admitting: Family Medicine

## 2019-05-07 NOTE — Telephone Encounter (Signed)
Patient said the rash started on 05/05/19 on lower legs and then began to spread up ward to upper legs and back , now on arms , has sporadic itching. Patient describes as a flat red rash. Wondered if the Cardizem could be the cause due to only change she has had . Denies fever, chills or any other symptom.

## 2019-05-07 NOTE — Telephone Encounter (Signed)
This could be a drug rash. It looks like she has a visit with me tomorrow for a phone call. Is there any way she could complete a video visit? It would be very helpful to see the rash. Please see if she has an appointment scheduled with cardiology as she could also contact them to see if they would recommend switching to a different medication.

## 2019-05-07 NOTE — Telephone Encounter (Signed)
Pt left a message on my voicemail. Pt had HFU on 05/03/19 Pt has now developed a rash. She thinks it could be due to a medication they put her on in the hospital. She would like to make PCP aware to see if this may be the cause.kat   Please advise [phone]

## 2019-05-08 ENCOUNTER — Encounter: Payer: Self-pay | Admitting: Family Medicine

## 2019-05-08 ENCOUNTER — Ambulatory Visit (INDEPENDENT_AMBULATORY_CARE_PROVIDER_SITE_OTHER): Payer: Medicare HMO | Admitting: Family Medicine

## 2019-05-08 ENCOUNTER — Other Ambulatory Visit: Payer: Self-pay

## 2019-05-08 DIAGNOSIS — R21 Rash and other nonspecific skin eruption: Secondary | ICD-10-CM

## 2019-05-08 NOTE — Progress Notes (Signed)
Virtual Visit via telephone Note  This visit type was conducted due to national recommendations for restrictions regarding the COVID-19 pandemic (e.g. social distancing).  This format is felt to be most appropriate for this patient at this time.  All issues noted in this document were discussed and addressed.  No physical exam was performed (except for noted visual exam findings with Video Visits).   I connected with Tamara Whitehead today at  1:15 PM EST by telephone and verified that I am speaking with the correct person using two identifiers. Location patient: home Location provider: work Persons participating in the virtual visit: patient, provider  I discussed the limitations, risks, security and privacy concerns of performing an evaluation and management service by telephone and the availability of in person appointments. I also discussed with the patient that there may be a patient responsible charge related to this service. The patient expressed understanding and agreed to proceed.  Interactive audio and video telecommunications were attempted between this provider and patient, however failed, due to patient having technical difficulties OR patient did not have access to video capability.  We continued and completed visit with audio only.   Reason for visit: same day visit  HPI: Rash: Patient notes this started in the last several days.  Started on her right shin and then has spread to bilateral legs, back, and arms.  Some itching on the spots on her back though not elsewhere.  She notes the only recent changes being started on diltiazem.  No fevers.  The outcr light red.  They are not raised.  They are not blistering.  There is no soreness.  They do not blanch.  There are no bull's-eye lesions.  She has had no throat swelling or tongue swelling.  No shortness of breath.  Notes the lesions are smaller than an eraser though are bigger than the tip of a ballpoint pen.  She has had no changes to  soaps or detergents.     ROS: See pertinent positives and negatives per HPI.  Past Medical History:  Diagnosis Date  . Arthritis    Osteoarthritis/Rheumatoid  . Cancer (HCC)    Skin  . Cataract   . Elevated blood pressure   . Fibrocystic breast disease   . Glaucoma   . Hypercholesterolemia   . Thyroid disease    Hypothyroid    Past Surgical History:  Procedure Laterality Date  . ABDOMINAL HYSTERECTOMY     Total abd. hysterectomu and bilateral salping-oophorectomy  . EYE SURGERY Right    Glaucoma surgery  . HEMORRHOID SURGERY    . status post dilatation and curettage  1975  . TONSILLECTOMY    . TUBAL LIGATION      Family History  Problem Relation Age of Onset  . CAD Mother   . Arthritis Mother        Rheumatoid Arthritis  . Arthritis Father        Rheumatoid/Osteoarthritis and Blindness, bleeding Tendencies,Glaucoma  . CAD Father   . Hypertension Maternal Grandmother   . COPD Neg Hx     SOCIAL HX: Smoker   Current Outpatient Medications:  .  Adalimumab 40 MG/0.8ML PNKT, Inject 40 mg into the skin every 14 (fourteen) days., Disp: , Rfl:  .  apixaban (ELIQUIS) 5 MG TABS tablet, Take 1 tablet (5 mg total) by mouth 2 (two) times daily., Disp: 60 tablet, Rfl: 3 .  Calcium Carbonate (CALCIUM 600 PO), Take 600 mg by mouth daily., Disp: , Rfl:  .  cefdinir (OMNICEF) 300 MG capsule, Take 1 capsule (300 mg total) by mouth 2 (two) times daily., Disp: 8 capsule, Rfl: 0 .  Coenzyme Q-10 100 MG capsule, Take 100 mg by mouth daily., Disp: , Rfl:  .  diltiazem (CARDIZEM CD) 240 MG 24 hr capsule, Take 1 capsule (240 mg total) by mouth daily., Disp: 30 capsule, Rfl: 3 .  doxycycline (VIBRAMYCIN) 100 MG capsule, Take 1 capsule (100 mg total) by mouth 2 (two) times daily., Disp: 8 capsule, Rfl: 0 .  HYDROcodone-homatropine (HYCODAN) 5-1.5 MG/5ML syrup, Take 5 mLs by mouth every 6 (six) hours as needed for cough., Disp: 60 mL, Rfl: 0 .  levothyroxine (SYNTHROID) 50 MCG tablet, Take  1 tablet (50 mcg total) by mouth daily before breakfast., Disp: 90 tablet, Rfl: 1 .  naproxen sodium (ALEVE) 220 MG tablet, Take 220 mg by mouth 2 (two) times daily with a meal., Disp: , Rfl:  .  nystatin (MYCOSTATIN) 100000 UNIT/ML suspension, Take 5 mLs (500,000 Units total) by mouth 4 (four) times daily. Swish around the mouth and then swallow., Disp: 240 mL, Rfl: 0 .  metoprolol tartrate (LOPRESSOR) 25 MG tablet, Take 1 tablet (25 mg total) by mouth 2 (two) times daily., Disp: 60 tablet, Rfl: 2  EXAM: This is a telehealth telephone visit and thus no physical exam was completed.   ASSESSMENT AND PLAN:  Discussed the following assessment and plan:  Rash Possible drug rash. Does not seem consistent with a rash related to thrombocytopenia based on description though we will have her complete her labs as early as she is able to. Notably she is far enough out from her exposure to Lovenox that that should not be an issue. She does note some transportation issues and does not think she can get her labs completed until Friday. She will discontinue the diltiazem. We will check with her clinical pharmacist regarding the conversion to metoprolol for rate control.    I discussed the assessment and treatment plan with the patient. The patient was provided an opportunity to ask questions and all were answered. The patient agreed with the plan and demonstrated an understanding of the instructions.   The patient was advised to call back or seek an in-person evaluation if the symptoms worsen or if the condition fails to improve as anticipated.  I provided 13 minutes of non-face-to-face time during this encounter.   Tommi Rumps, MD

## 2019-05-08 NOTE — Telephone Encounter (Signed)
Patient seen in office before I could call.

## 2019-05-09 DIAGNOSIS — R21 Rash and other nonspecific skin eruption: Secondary | ICD-10-CM | POA: Insufficient documentation

## 2019-05-09 MED ORDER — METOPROLOL TARTRATE 25 MG PO TABS: 25.0000 mg | ORAL_TABLET | Freq: Two times a day (BID) | ORAL | 2 refills | Status: DC

## 2019-05-09 NOTE — Assessment & Plan Note (Signed)
Possible drug rash. Does not seem consistent with a rash related to thrombocytopenia based on description though we will have her complete her labs as early as she is able to. Notably she is far enough out from her exposure to Lovenox that that should not be an issue. She does note some transportation issues and does not think she can get her labs completed until Friday. She will discontinue the diltiazem. We will check with her clinical pharmacist regarding the conversion to metoprolol for rate control.

## 2019-05-09 NOTE — Telephone Encounter (Signed)
I called and spoke to the patient and informed her that the provider sent a new BP medication to the pharmacy called metoprolol and she is to stop the diltiazem. I informed her to start the metoprolol the next day after stopping the diltiazem.  I also informed her to call customer service and see if she can get a free BP cuff due to a program her insurance company has and I informed her that the provider wanted her to start taking her BP & pulse at home after receiving the medication and BP cuff.  The patient was informed that if she has fatigue and fee light headed with the medication to let us know and she understood all instructions. Tamara Whitehead,cma

## 2019-05-09 NOTE — Telephone Encounter (Signed)
Please call the patient and let her know that I sent in metoprolol for her to start on. This will take the place of the diltiazem which she should be stopping due to her rash. She will start the metoprolol the day after stopping the diltiazem. I would like for the patient to start checking her BP and pulse at home. Catie mentioned that her insurance may have an "Over The Counter" benefits progam with her insurance company. She can call the customer service number on the back of the card and she may be able to get a cuff for free through her insurance. She should start on the metoprolol as soon as possible and try to get the BP cuff as soon as possible. If she starts to feel fatigued or light headed with the metoprolol she needs to let us know.

## 2019-05-10 NOTE — Chronic Care Management (AMB) (Signed)
  Chronic Care Management   Note  05/10/2019 Name: SHALANDRA LEU MRN: [id number] DOB: 1946/04/02  Tamara Whitehead is a 73 y.o. year old female who is a primary care patient of [name], Angela Adam, MD. I reached out to Karen Kays by phone today in response to a referral sent by Ms. Elizabeth Sauer Fister's PCP, Dr. Tommi Rumps     Ms. Hrivnak was given information about Chronic Care Management services today including:  1. CCM service includes personalized support from designated clinical staff supervised by her physician, including individualized plan of care and coordination with other care providers 2. 24/7 contact phone numbers for assistance for urgent and routine care needs. 3. Service will only be billed when office clinical staff spend 20 minutes or more in a month to coordinate care. 4. Only one practitioner may furnish and bill the service in a calendar month. 5. The patient may stop CCM services at any time (effective at the end of the month) by phone call to the office staff. 6. The patient will be responsible for cost sharing (co-pay) of up to 20% of the service fee (after annual deductible is met).  Patient agreed to services and verbal consent obtained.   Follow up plan: Telephone appointment with CCM team member scheduled for:06/17/2019  Glenna Durand, [place], [place] [facility] ??[email or web address] ??[phone]

## 2019-05-13 ENCOUNTER — Ambulatory Visit
Admission: RE | Admit: 2019-05-13 | Discharge: 2019-05-13 | Disposition: A | Discharge status: Home / Self Care | Payer: Medicare HMO | Source: Ambulatory Visit | Attending: Family Medicine | Admitting: Family Medicine

## 2019-05-13 ENCOUNTER — Other Ambulatory Visit
Admission: RE | Admit: 2019-05-13 | Discharge: 2019-05-13 | Disposition: A | Discharge status: Home / Self Care | Payer: Medicare HMO | Source: Ambulatory Visit | Attending: Family Medicine | Admitting: Family Medicine

## 2019-05-13 DIAGNOSIS — J189 Pneumonia, unspecified organism: Secondary | ICD-10-CM

## 2019-05-13 DIAGNOSIS — R918 Other nonspecific abnormal finding of lung field: Secondary | ICD-10-CM | POA: Diagnosis not present

## 2019-05-13 LAB — COMPREHENSIVE METABOLIC PANEL
ALT: 32 U/L (ref 0–44)
AST: 32 U/L (ref 15–41)
Albumin: 4.1 g/dL (ref 3.5–5.0)
Alkaline Phosphatase: 89 U/L (ref 38–126)
Anion gap: 9 (ref 5–15)
BUN: 13 mg/dL (ref 8–23)
CO2: 25 mmol/L (ref 22–32)
Calcium: 9 mg/dL (ref 8.9–10.3)
Chloride: 103 mmol/L (ref 98–111)
Creatinine, Ser: 0.76 mg/dL (ref 0.44–1.00)
GFR calc Af Amer: >60 mL/min (ref >60)
GFR calc non Af Amer: >60 mL/min (ref >60)
Glucose, Bld: 107 mg/dL — ABNORMAL HIGH (ref 70–99)
Potassium: 4 mmol/L (ref 3.5–5.1)
Sodium: 137 mmol/L (ref 135–145)
Total Bilirubin: 0.6 mg/dL (ref 0.3–1.2)
Total Protein: 8 g/dL (ref 6.5–8.1)

## 2019-05-13 LAB — CBC
HCT: 42.8 % (ref 36.0–46.0)
Hemoglobin: 14 g/dL (ref 12.0–15.0)
MCH: 31.2 pg (ref 26.0–34.0)
MCHC: 32.7 g/dL (ref 30.0–36.0)
MCV: 95.3 fL (ref 80.0–100.0)
Platelets: 214 10*3/uL (ref 150–400)
RBC: 4.49 MIL/uL (ref 3.87–5.11)
RDW: 13.7 % (ref 11.5–15.5)
WBC: 11.5 10*3/uL — ABNORMAL HIGH (ref 4.0–10.5)
nRBC: 0 % (ref 0.0–0.2)

## 2019-05-16 ENCOUNTER — Other Ambulatory Visit: Payer: Self-pay | Admitting: Family Medicine

## 2019-05-16 DIAGNOSIS — R9389 Abnormal findings on diagnostic imaging of other specified body structures: Secondary | ICD-10-CM

## 2019-05-30 ENCOUNTER — Ambulatory Visit: Payer: Medicare HMO

## 2019-06-04 DIAGNOSIS — Z862 Personal history of diseases of the blood and blood-forming organs and certain disorders involving the immune mechanism: Secondary | ICD-10-CM | POA: Diagnosis not present

## 2019-06-04 DIAGNOSIS — Z23 Encounter for immunization: Secondary | ICD-10-CM | POA: Diagnosis not present

## 2019-06-04 DIAGNOSIS — M1812 Unilateral primary osteoarthritis of first carpometacarpal joint, left hand: Secondary | ICD-10-CM | POA: Diagnosis not present

## 2019-06-04 DIAGNOSIS — M0579 Rheumatoid arthritis with rheumatoid factor of multiple sites without organ or systems involvement: Secondary | ICD-10-CM | POA: Diagnosis not present

## 2019-06-04 DIAGNOSIS — D696 Thrombocytopenia, unspecified: Secondary | ICD-10-CM | POA: Diagnosis not present

## 2019-06-17 ENCOUNTER — Ambulatory Visit (INDEPENDENT_AMBULATORY_CARE_PROVIDER_SITE_OTHER): Payer: Medicare HMO | Admitting: Pharmacist

## 2019-06-17 ENCOUNTER — Telehealth: Payer: Self-pay | Admitting: Family

## 2019-06-17 DIAGNOSIS — E039 Hypothyroidism, unspecified: Secondary | ICD-10-CM

## 2019-06-17 DIAGNOSIS — I4892 Unspecified atrial flutter: Secondary | ICD-10-CM

## 2019-06-17 DIAGNOSIS — E7800 Pure hypercholesterolemia, unspecified: Secondary | ICD-10-CM

## 2019-06-17 NOTE — Chronic Care Management (AMB) (Signed)
Chronic Care Management   Note  06/17/2019 Name: Tamara WALDVOGEL MRN: [id number] DOB: April 03, 1946   Subjective:  Tamara Whitehead is a 74 y.o. year old female who is a primary care patient of [name], Angela Adam, MD. The CCM team was consulted for assistance with chronic disease management and care coordination needs.    Contacted patient for medication management review.   Review of patient status, including review of consultants reports, laboratory and other test data, was performed as part of comprehensive evaluation and provision of chronic care management services.   Objective:  Lab Results  Component Value Date   CREATININE 0.76 05/13/2019   CREATININE 0.66 04/10/2019   CREATININE 0.61 04/08/2019    Lab Results  Component Value Date   HGBA1C 5.3 12/12/2016       Component Value Date/Time   CHOL 288 (H) 01/15/2019 1008   CHOL 239 (H) 02/02/2015 0913   TRIG 233.0 (H) 01/15/2019 1008   HDL 39.30 01/15/2019 1008   HDL 37 (L) 02/02/2015 0913   CHOLHDL 7 01/15/2019 1008   VLDL 46.6 (H) 01/15/2019 1008   LDLCALC 180 (H) 11/14/2016 0946   LDLCALC 157 (H) 02/02/2015 0913   LDLDIRECT 195.0 01/15/2019 1008    Clinical ASCVD: No  The 10-year ASCVD risk score Mikey Bussing DC Jr., et al., 2013) is: 33.3%   Values used to calculate the score:     Age: 32 years     Sex: Female     Is Non-Hispanic African American: No     Diabetic: No     Tobacco smoker: Yes     Systolic Blood Pressure: 607 mmHg     Is BP treated: No     HDL Cholesterol: 39.3 mg/dL     Total Cholesterol: 288 mg/dL    BP Readings from Last 3 Encounters:  04/10/19 134/78  01/15/19 110/70  01/10/18 (!) 142/82    Allergies  Allergen Reactions  . Codeine Nausea And Vomiting    Can take cough syrups    Medications Reviewed Today    Reviewed by De Hollingshead, [facility] (Pharmacist) on 06/17/19 at 1527  Med List Status: <None>  Medication Order Taking? Sig Documenting Provider Last Dose Status Informant    Adalimumab 40 MG/0.8ML PNKT [id number] Yes Inject 40 mg into the skin every 14 (fourteen) days. [provider] Taking Active Self  apixaban (ELIQUIS) 5 MG TABS tablet [id number] No Take 1 tablet (5 mg total) by mouth 2 (two) times daily.  Patient not taking: Reported on 06/17/2019   Edwin Dada, MD Not Taking Active   Calcium Carbonate (CALCIUM 600 PO) [id number] Yes Take 600 mg by mouth daily. [provider] Taking Active Self  Coenzyme Q-10 100 MG capsule [id number] Yes Take 100 mg by mouth daily. [provider] Taking Active Self  levothyroxine (SYNTHROID) 50 MCG tablet [id number] Yes Take 1 tablet (50 mcg total) by mouth daily before breakfast. Leone Haven, MD Taking Active Self  metoprolol tartrate (LOPRESSOR) 25 MG tablet [id number] Yes Take 1 tablet (25 mg total) by mouth 2 (two) times daily. Leone Haven, MD Taking Active   naproxen sodium (ALEVE) 220 MG tablet [id number] Yes Take 220 mg by mouth 2 (two) times daily with a meal. [provider] Taking Active Self        Discontinued 06/17/19 1527 (Completed Course)            Assessment:   Goals Addressed  This Visit's Progress     Patient Stated   . "I need help with my medications" (pt-stated)       Current Barriers:  . Polypharmacy; complex patient with multiple comorbidities including atrial fibrillation, HLD, HTN, RA . 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 Is struggling financially. She and her son live together, but she notes that he is struggling with his own financial/medical issues d/t a cancer diagnosis.  . Self-manages medications.  o Afib: recent diagnosis during November hospitalization for pneumonia. Started on Eliquis 5 mg BID, but she has never started due to the cost. She notes she was given the free 30 day supply trial, but did not want to start the medication until she spoke with Dr.  Chesley Noon. Initially started on diltiazem at hospitalization, but developed a rash, so was switched to metoprolol 25 mg BID. Continues to check BP/HR at home as directed by provider o RA: On Humira per [facility] (receives via patient assistance program). Notes that she was off for a few weeks after her pneumonia incident, recently restarted and was recently seen by Dr. Jefm Fedder. Notes that she takes naproxen PRN RA pain o HLD: rosuvastatin held at discharge d/t elevated LFTs; repeat post discharge LFT were normal, would consider retrial of statin moving forward given 33% ASCVD risk score o Hypothyroidism: levothyroxine 50 mcg daily  Pharmacist Clinical Goal(s):  Marland Kitchen Over the next 90 days, patient will work with PharmD and provider towards optimized medication management  Interventions: . Comprehensive medication review performed; medication list updated in electronic medical record . Reviewed eligibility for Medicare Extra Help. Patient's SS income would qualify her for Extra Help, but unsure how her son's "odd jobs" income or her unemployment income that runs out in 3 weeks would be categorized. Called [facility] counselor Willey Blade, left message for a return call for support in screening/enrolling patient for Medicare Extra Help . Reviewed importance of Eliquis therapy to prevent embolic event. Encouraged patient to utilize the free 30 day copay card to start therapy. Reviewed with clinic staff, we do not have Eliquis samples available. Will collaborate w/ office staff and [place] drug reps to see if samples could be obtained . Reviewed elevated home BP. Given HR at low end of goal, would avoid increasing metoprolol. Could consider thiazide diuretic vs amlodipine, but give no history hypertension before now, would prefer a nurse visit for BP check at least, if not visit w/ provider, to confirm need for additional pharmacotherapy. Collaborated w/ office staff to have her scheduled with  Dr. Derrel Nip, as primary PCP is off this week.  . Patient did not establish w/ cardiology as directed after hospitalization. Unsure if another referral needs to be placed; will defer to Dr. Freddie Apley. [name].  . Placed Care Guide referral for community resource support  . Uncertain if LFT elevation was related to statin therapy or other etiologies. Noted that patient did fill a prescription for rosuvastatin in August, but do not see where patient had lab work done after that, prior to hospitalization. Consider re-initiation of statin w/ LFT after, given elevated ASCVD risk   Patient Self Care Activities:  . Patient will take medications as prescribed  Initial goal documentation        Plan: - Collaborated w/ office staff - f/u scheduled w/ Dr. Derrel Nip later this week. Will route note to her for FYI - Will collaborate w/ [place] [facility].   Catie  Darnelle Maffucci, PharmD, [place], [place] Clinical Pharmacist [place] [phone]

## 2019-06-17 NOTE — Telephone Encounter (Signed)
Reported Bps over the past ~1.5 weeks:  SBP DBP HR  155 79 68  160 85 61  164 86 62  180 90 62  168 92 64  188 89 65  169 83 53     Of note, patient has NOT taken Eliquis therapy since hospital discharge. Encouraged to utilize the free 30 day trial offer coupon that she was provided at discharge while we work on long term solutions.   Her phone was acting up when I tried to call, so call the son's phone

## 2019-06-17 NOTE — Telephone Encounter (Signed)
Tamara Whitehead,   Please call pt [name] is out of office  Per Catie, pharmD, patients BP has been running high, 140s/150s.   Please advise her to monitor HR, BP and if she develops any symptoms including shortness of breath, chest pain, palpitations, severe headache or vision changes, she will need to go to emergency room. Advise patient to call with ANY concerns between now and then.    Otherwise, advise Patient that she has been scheduled with first available provider, Dr Derrel Nip on Thursday at 50 in person for now ( per Dr Derrel Nip discretion).  Tell her we can confirm this once we speak with provider.   Gwynneth Albright  made this appointment and she and I talked about it since I am covering Randall Hiss this week. We felt that being in person to check BP you may agree. If not let us know and I will have Tamara Whitehead reach out to her.   If in person, please tell her to bring BP machine as well.    FYI Kathy,Catie

## 2019-06-17 NOTE — Patient Instructions (Signed)
Visit Information  Goals Addressed            This Visit's Progress     Patient Stated   . "I need help with my medications" (pt-stated)       Current Barriers:  . Polypharmacy; complex patient with multiple comorbidities including atrial fibrillation, HLD, HTN, RA . 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 Is struggling financially. She and her son live together, but she notes that he is struggling with his own financial/medical issues d/t a cancer diagnosis.  . Self-manages medications.  o Afib: recent diagnosis during November hospitalization for pneumonia. Started on Eliquis 5 mg BID, but she has never started due to the cost. She notes she was given the free 30 day supply trial, but did not want to start the medication until she spoke with Dr. Chesley Noon. Initially started on diltiazem at hospitalization, but developed a rash, so was switched to metoprolol 25 mg BID. Continues to check BP/HR at home as directed by provider SBP DBP HR  155 79 68  160 85 61  164 86 62  180 90 62  168 92 64  188 89 65  169 83 53   o RA: On Humira per [facility] (receives via patient assistance program). Notes that she was off for a few weeks after her pneumonia incident, recently restarted and was recently seen by Dr. Jefm Thelen. Notes that she takes naproxen PRN RA pain o HLD: rosuvastatin held at discharge d/t elevated LFTs; repeat post discharge LFT were normal, would consider retrial of statin moving forward given 33% ASCVD risk score o Hypothyroidism: levothyroxine 50 mcg daily  Pharmacist Clinical Goal(s):  Marland Kitchen Over the next 90 days, patient will work with PharmD and provider towards optimized medication management  Interventions: . Comprehensive medication review performed; medication list updated in electronic medical record . Reviewed eligibility for Medicare Extra Help. Patient's SS income would qualify her for Extra Help, but  unsure how her son's "odd jobs" income or her unemployment income that runs out in 3 weeks would be categorized. Called [facility] counselor Willey Blade, left message for a return call for support in screening/enrolling patient for Medicare Extra Help . Reviewed importance of Eliquis therapy to prevent embolic event. Encouraged patient to utilize the free 30 day copay card to start therapy. Reviewed with clinic staff, we do not have Eliquis samples available. Will collaborate w/ office staff and [place] drug reps to see if samples could be obtained . Reviewed elevated home BP. Given HR at low end of goal, would avoid increasing metoprolol. Could consider thiazide diuretic vs amlodipine, but give no history hypertension before now, would prefer a nurse visit for BP check at least, if not visit w/ provider, to confirm need for additional pharmacotherapy. Collaborated w/ office staff to have her scheduled with Dr. Derrel Nip, as primary PCP is off this week.  . Patient did not establish w/ cardiology as directed after hospitalization. Unsure if another referral needs to be placed; will defer to Dr. Freddie Apley. [name].  . Placed Care Guide referral for community resource support  . Uncertain if LFT elevation was related to statin therapy or other etiologies. Noted that patient did fill a prescription for rosuvastatin in August, but do not see where patient had lab work done after that, prior to hospitalization. Consider re-initiation of statin w/ LFT after, given elevated ASCVD risk   Patient Self Care Activities:  .  Patient will take medications as prescribed  Initial goal documentation        The patient verbalized understanding of instructions provided today and declined a print copy of patient instruction materials.   Plan: - Collaborated w/ office staff - f/u scheduled w/ Dr. Derrel Nip later this week. Will route note to her for FYI - Will collaborate w/ [place] [facility].   Catie Darnelle Maffucci, PharmD,  [place], CPP Clinical Pharmacist [place] [phone]

## 2019-06-18 ENCOUNTER — Telehealth: Payer: Self-pay

## 2019-06-18 NOTE — Telephone Encounter (Signed)
This is the patient I scheduled with you for Thursday thank you.

## 2019-06-18 NOTE — Telephone Encounter (Signed)
06/18/2019 Spoke with patient about Medicaid Extrahelp, [place] and [employer].  Ambrose Mantle [phone]

## 2019-06-19 NOTE — Telephone Encounter (Signed)
I see that she has been rescheduled to see Dr Aundra Dubin on the 28th.

## 2019-06-20 ENCOUNTER — Other Ambulatory Visit: Payer: Self-pay

## 2019-06-20 ENCOUNTER — Ambulatory Visit: Payer: Medicare HMO | Admitting: Internal Medicine

## 2019-06-20 ENCOUNTER — Ambulatory Visit (INDEPENDENT_AMBULATORY_CARE_PROVIDER_SITE_OTHER): Payer: Medicare HMO | Admitting: Internal Medicine

## 2019-06-20 VITALS — BP 160/102 | HR 68 | Temp 97.3°F | Ht 61.0 in | Wt 105.2 lb

## 2019-06-20 DIAGNOSIS — I10 Essential (primary) hypertension: Secondary | ICD-10-CM | POA: Diagnosis not present

## 2019-06-20 DIAGNOSIS — I4891 Unspecified atrial fibrillation: Secondary | ICD-10-CM

## 2019-06-20 DIAGNOSIS — I4892 Unspecified atrial flutter: Secondary | ICD-10-CM | POA: Diagnosis not present

## 2019-06-20 DIAGNOSIS — R001 Bradycardia, unspecified: Secondary | ICD-10-CM | POA: Diagnosis not present

## 2019-06-20 DIAGNOSIS — R918 Other nonspecific abnormal finding of lung field: Secondary | ICD-10-CM | POA: Diagnosis not present

## 2019-06-20 MED ORDER — LOSARTAN POTASSIUM-HCTZ 50-12.5 MG PO TABS: 0.5000 | ORAL_TABLET | Freq: Every day | ORAL | 0 refills | Status: DC

## 2019-06-20 NOTE — Patient Instructions (Addendum)
Will refer to Dr. Ubaldo Glassing cardiology  We have to set of CT chest again Melissa will call you  Take aspirin 81 mg daily    Atrial Flutter  Atrial flutter is a type of abnormal heart rhythm (arrhythmia). The heart has an electrical system that tells it how to beat. In atrial flutter, the signals move rapidly in the top chambers of the heart (the atria). This makes your heart beat very fast. Atrial flutter can come and go, or it can be permanent. The goal of treatment is to prevent blood clots from forming, control your heart rate, or restore your heartbeat to a normal rhythm. If this condition is not treated, it can cause serious problems, such as a weakened heart muscle (cardiomyopathy) or a stroke. What are the causes? This condition is often caused by conditions that damage the heart's electrical system. These include:  Heart conditions and heart surgery. These include heart attacks and open-heart surgery.  Lung problems, such as COPD or a blood clot in the lung (pulmonary embolism, or PE).  Poorly controlled high blood pressure (hypertension).  Overactive thyroid (hyperthyroidism).  Diabetes. In some cases, the cause of this condition is not known. What increases the risk? You are more likely to develop this condition if:  You are an elderly adult.  You are a man.  You are overweight (obese).  You have obstructive sleep apnea.  You have a family history of atrial flutter.  You have diabetes.  You drink a lot of alcohol, especially binge drinking.  You use drugs, including cannabis.  You smoke. What are the signs or symptoms? Symptoms of this condition include:  A feeling that your heart is pounding or racing (palpitations).  Shortness of breath.  Chest pain.  Feeling dizzy or light-headed.  Fainting.  Low blood pressure (hypotension).  Fatigue.  Tiring easily during exercise or activity. In some cases, there are no symptoms. How is this diagnosed? This  condition may be diagnosed with:  An electrocardiogram (ECG) to check electrical signals of the heart.  An ambulatory cardiac monitor to record your heart's activity for a few days.  An echocardiogram to create pictures of your heart.  A transesophageal echocardiogram (TEE) to create even better pictures of your heart.  A stress test to check your blood supply while you exercise.  Imaging tests, such as a CT scan or chest X-ray.  Blood tests. How is this treated? Treatment depends on underlying conditions and how you feel when you experience atrial flutter. This condition may be treated with:  Medicines to prevent blood clots or to treat heart rate or heart rhythm problems.  Electrical cardioversion to reset the heart's rhythm.  Ablation to remove the heart tissue that sends abnormal signals.  Left atrial appendage closure to seal the area where blood clots can form. In some cases, underlying conditions will be treated. Follow these instructions at home: Medicines  Take over-the-counter and prescription medicines only as told by your health care provider.  Do not take any new medicines without talking to your health care provider.  If you are taking blood thinners: ? Talk with your health care provider before you take any medicines that contain aspirin or NSAIDs, such as ibuprofen. These medicines increase your risk for dangerous bleeding. ? Take your medicine exactly as told, at the same time every day. ? Avoid activities that could cause injury or bruising, and follow instructions about how to prevent falls. ? Wear a medical alert bracelet or carry a  card that lists what medicines you take. Lifestyle  Eat heart-healthy foods. Talk with a dietitian to make an eating plan that is right for you.  Do not use any products that contain nicotine or tobacco, such as cigarettes, e-cigarettes, and chewing tobacco. If you need help quitting, ask your health care provider.  Do not  drink alcohol.  Do not use drugs, including cannabis.  Lose weight if you are overweight or obese.  Exercise regularly as instructed by your health care provider. General instructions  Do not use diet pills unless your health care provider approves. Diet pills may make heart problems worse.  If you have obstructive sleep apnea, manage your condition as told by your health care provider.  Keep all follow-up visits as told by your health care provider. This is important. Contact a health care provider if you:  Notice a change in the rate, rhythm, or strength of your heartbeat.  Are taking a blood thinner and you notice more bruising.  Have a sudden change in weight.  Tire more easily when you exercise or do heavy work. Get help right away if you have:  Pain or pressure in your chest.  Shortness of breath.  Fainting.  Increasing sweating with no known cause.  Side effects of blood thinners, such as blood in your vomit, stool, or urine, or bleeding that cannot stop.  Any symptoms of a stroke. "BE FAST" is an easy way to remember the main warning signs of a stroke: ? B - Balance. Signs are dizziness, sudden trouble walking, or loss of balance. ? E - Eyes. Signs are trouble seeing or a sudden change in vision. ? F - Face. Signs are sudden weakness or numbness of the face, or the face or eyelid drooping on one side. ? A - Arms. Signs are weakness or numbness in an arm. This happens suddenly and usually on one side of the body. ? S - Speech. Signs are sudden trouble speaking, slurred speech, or trouble understanding what people say. ? T - Time. Time to call emergency services. Write down what time symptoms started.  Other signs of a stroke, such as: ? A sudden, severe headache with no known cause. ? Nausea or vomiting. ? Seizure.  These symptoms may represent a serious problem that is an emergency. Do not wait to see if the symptoms will go away. Get medical help right away.  Call your local emergency services (911 in the [place] Do not drive yourself to the hospital. Summary  Atrial flutter is an abnormal heart rhythm that can give you symptoms of palpitations, shortness of breath, or fatigue.  Atrial flutter is often treated with medicines to keep your heart in a normal rhythm and to prevent a stroke.  Get help right away if you cannot catch your breath, or have chest pain or pressure.  Get help right away if you have signs or symptoms of a stroke. This information is not intended to replace advice given to you by your health care provider. Make sure you discuss any questions you have with your health care provider. Document Revised: 10/31/2018 Document Reviewed: 10/31/2018 Elsevier Patient Education  [place] DASH stands for "Dietary Approaches to Stop Hypertension." The DASH eating plan is a healthy eating plan that has been shown to reduce high blood pressure (hypertension). It may also reduce your risk for type 2 diabetes, heart disease, and stroke. The DASH eating plan may also help with weight  loss. What are tips for following this plan?  General guidelines  Avoid eating more than 2,300 mg (milligrams) of salt (sodium) a day. If you have hypertension, you may need to reduce your sodium intake to 1,500 mg a day.  Limit alcohol intake to no more than 1 drink a day for nonpregnant women and 2 drinks a day for men. One drink equals 12 oz of beer, 5 oz of wine, or 1 oz of hard liquor.  Work with your health care provider to maintain a healthy body weight or to lose weight. Ask what an ideal weight is for you.  Get at least 30 minutes of exercise that causes your heart to beat faster (aerobic exercise) most days of the week. Activities may include walking, swimming, or biking.  Work with your health care provider or diet and nutrition specialist (dietitian) to adjust your eating plan to your individual calorie needs. Reading  food labels   Check food labels for the amount of sodium per serving. Choose foods with less than 5 percent of the Daily Value of sodium. Generally, foods with less than 300 mg of sodium per serving fit into this eating plan.  To find whole grains, look for the word "whole" as the first word in the ingredient list. Shopping  Buy products labeled as "low-sodium" or "no salt added."  Buy fresh foods. Avoid canned foods and premade or frozen meals. Cooking  Avoid adding salt when cooking. Use salt-free seasonings or herbs instead of table salt or sea salt. Check with your health care provider or pharmacist before using salt substitutes.  Do not fry foods. Cook foods using healthy methods such as baking, boiling, grilling, and broiling instead.  Cook with heart-healthy oils, such as olive, canola, soybean, or sunflower oil. Meal planning  Eat a balanced diet that includes: ? 5 or more servings of fruits and vegetables each day. At each meal, try to fill half of your plate with fruits and vegetables. ? Up to 6-8 servings of whole grains each day. ? Less than 6 oz of lean meat, poultry, or fish each day. A 3-oz serving of meat is about the same size as a deck of cards. One egg equals 1 oz. ? 2 servings of low-fat dairy each day. ? A serving of nuts, seeds, or beans 5 times each week. ? Heart-healthy fats. Healthy fats called Omega-3 fatty acids are found in foods such as flaxseeds and coldwater fish, like sardines, salmon, and mackerel.  Limit how much you eat of the following: ? Canned or prepackaged foods. ? Food that is high in trans fat, such as fried foods. ? Food that is high in saturated fat, such as fatty meat. ? Sweets, desserts, sugary drinks, and other foods with added sugar. ? Full-fat dairy products.  Do not salt foods before eating.  Try to eat at least 2 vegetarian meals each week.  Eat more home-cooked food and less restaurant, buffet, and fast food.  When eating at  a restaurant, ask that your food be prepared with less salt or no salt, if possible. What foods are recommended? The items listed may not be a complete list. Talk with your dietitian about what dietary choices are best for you. Grains Whole-grain or whole-wheat bread. Whole-grain or whole-wheat pasta. Brown rice. [place]. Bulgur. Whole-grain and low-sodium cereals. Pita bread. Low-fat, low-sodium crackers. Whole-wheat flour tortillas. Vegetables Fresh or frozen vegetables (raw, steamed, roasted, or grilled). Low-sodium or reduced-sodium tomato and vegetable juice. Low-sodium or  reduced-sodium tomato sauce and tomato paste. Low-sodium or reduced-sodium canned vegetables. Fruits All fresh, dried, or frozen fruit. Canned fruit in natural juice (without added sugar). Meat and other protein foods Skinless chicken or [place]. Ground chicken or [place]. Pork with fat trimmed off. Fish and seafood. Egg whites. Dried beans, peas, or lentils. Unsalted nuts, nut butters, and seeds. Unsalted canned beans. Lean cuts of beef with fat trimmed off. Low-sodium, lean deli meat. Dairy Low-fat (1%) or fat-free (skim) milk. Fat-free, low-fat, or reduced-fat cheeses. Nonfat, low-sodium ricotta or cottage cheese. Low-fat or nonfat yogurt. Low-fat, low-sodium cheese. Fats and oils Soft margarine without trans fats. Vegetable oil. Low-fat, reduced-fat, or light mayonnaise and salad dressings (reduced-sodium). Canola, safflower, olive, soybean, and sunflower oils. Avocado. Seasoning and other foods Herbs. Spices. Seasoning mixes without salt. Unsalted popcorn and pretzels. Fat-free sweets. What foods are not recommended? The items listed may not be a complete list. Talk with your dietitian about what dietary choices are best for you. Grains Baked goods made with fat, such as croissants, muffins, or some breads. Dry pasta or rice meal packs. Vegetables Creamed or fried vegetables. Vegetables in a cheese sauce.  Regular canned vegetables (not low-sodium or reduced-sodium). Regular canned tomato sauce and paste (not low-sodium or reduced-sodium). Regular tomato and vegetable juice (not low-sodium or reduced-sodium). Angie Fava. Olives. Fruits Canned fruit in a light or heavy syrup. Fried fruit. Fruit in cream or butter sauce. Meat and other protein foods Fatty cuts of meat. Ribs. Fried meat. [place] Salines. Sausage. Bologna and other processed lunch meats. Salami. Fatback. Hotdogs. Bratwurst. Salted nuts and seeds. Canned beans with added salt. Canned or smoked fish. Whole eggs or egg yolks. Chicken or [place] with skin. Dairy Whole or 2% milk, cream, and half-and-half. Whole or full-fat cream cheese. Whole-fat or sweetened yogurt. Full-fat cheese. Nondairy creamers. Whipped toppings. Processed cheese and cheese spreads. Fats and oils Butter. Stick margarine. Lard. Shortening. Ghee. Bacon fat. Tropical oils, such as coconut, palm kernel, or palm oil. Seasoning and other foods Salted popcorn and pretzels. Onion salt, garlic salt, seasoned salt, table salt, and sea salt. Worcestershire sauce. Tartar sauce. Barbecue sauce. Teriyaki sauce. Soy sauce, including reduced-sodium. Steak sauce. Canned and packaged gravies. Fish sauce. Oyster sauce. Cocktail sauce. Horseradish that you find on the shelf. Ketchup. Mustard. Meat flavorings and tenderizers. Bouillon cubes. Hot sauce and Tabasco sauce. Premade or packaged marinades. Premade or packaged taco seasonings. Relishes. Regular salad dressings. Where to find more information:  National Heart, Lung, and [place]: https://wilson-eaton.com/  American Heart Association: www.heart.org Summary  The DASH eating plan is a healthy eating plan that has been shown to reduce high blood pressure (hypertension). It may also reduce your risk for type 2 diabetes, heart disease, and stroke.  With the DASH eating plan, you should limit salt (sodium) intake to 2,300 mg a day. If you have  hypertension, you may need to reduce your sodium intake to 1,500 mg a day.  When on the DASH eating plan, aim to eat more fresh fruits and vegetables, whole grains, lean proteins, low-fat dairy, and heart-healthy fats.  Work with your health care provider or diet and nutrition specialist (dietitian) to adjust your eating plan to your individual calorie needs. This information is not intended to replace advice given to you by your health care provider. Make sure you discuss any questions you have with your health care provider. Document Revised: 04/21/2017 Document Reviewed: 05/02/2016 Elsevier Patient Education  [place].   Low-Sodium Eating Plan Sodium, which  is an element that makes up salt, helps you maintain a healthy balance of fluids in your body. Too much sodium can increase your blood pressure and cause fluid and waste to be held in your body. Your health care provider or dietitian may recommend following this plan if you have high blood pressure (hypertension), kidney disease, liver disease, or heart failure. Eating less sodium can help lower your blood pressure, reduce swelling, and protect your heart, liver, and kidneys. What are tips for following this plan? General guidelines  Most people on this plan should limit their sodium intake to 1,500-2,000 mg (milligrams) of sodium each day. Reading food labels   The Nutrition Facts label lists the amount of sodium in one serving of the food. If you eat more than one serving, you must multiply the listed amount of sodium by the number of servings.  Choose foods with less than 140 mg of sodium per serving.  Avoid foods with 300 mg of sodium or more per serving. Shopping  Look for lower-sodium products, often labeled as "low-sodium" or "no salt added."  Always check the sodium content even if foods are labeled as "unsalted" or "no salt added".  Buy fresh foods. ? Avoid canned foods and premade or frozen meals. ? Avoid  canned, cured, or processed meats  Buy breads that have less than 80 mg of sodium per slice. Cooking  Eat more home-cooked food and less restaurant, buffet, and fast food.  Avoid adding salt when cooking. Use salt-free seasonings or herbs instead of table salt or sea salt. Check with your health care provider or pharmacist before using salt substitutes.  Cook with plant-based oils, such as canola, sunflower, or olive oil. Meal planning  When eating at a restaurant, ask that your food be prepared with less salt or no salt, if possible.  Avoid foods that contain MSG (monosodium glutamate). MSG is sometimes added to [place] food, bouillon, and some canned foods. What foods are recommended? The items listed may not be a complete list. Talk with your dietitian about what dietary choices are best for you. Grains Low-sodium cereals, including oats, puffed wheat and rice, and shredded wheat. Low-sodium crackers. Unsalted rice. Unsalted pasta. Low-sodium bread. Whole-grain breads and whole-grain pasta. Vegetables Fresh or frozen vegetables. "No salt added" canned vegetables. "No salt added" tomato sauce and paste. Low-sodium or reduced-sodium tomato and vegetable juice. Fruits Fresh, frozen, or canned fruit. Fruit juice. Meats and other protein foods Fresh or frozen (no salt added) meat, poultry, seafood, and fish. Low-sodium canned tuna and salmon. Unsalted nuts. Dried peas, beans, and lentils without added salt. Unsalted canned beans. Eggs. Unsalted nut butters. Dairy Milk. Soy milk. Cheese that is naturally low in sodium, such as ricotta cheese, fresh mozzarella, or Swiss cheese Low-sodium or reduced-sodium cheese. Cream cheese. Yogurt. Fats and oils Unsalted butter. Unsalted margarine with no trans fat. Vegetable oils such as canola or olive oils. Seasonings and other foods Fresh and dried herbs and spices. Salt-free seasonings. Low-sodium mustard and ketchup. Sodium-free salad dressing.  Sodium-free light mayonnaise. Fresh or refrigerated horseradish. Lemon juice. Vinegar. Homemade, reduced-sodium, or low-sodium soups. Unsalted popcorn and pretzels. Low-salt or salt-free chips. What foods are not recommended? The items listed may not be a complete list. Talk with your dietitian about what dietary choices are best for you. Grains Instant hot cereals. Bread stuffing, pancake, and biscuit mixes. Croutons. Seasoned rice or pasta mixes. Noodle soup cups. Boxed or frozen macaroni and cheese. Regular salted crackers. Self-rising flour. Vegetables  Sauerkraut, pickled vegetables, and relishes. Olives. [place] fries. Onion rings. Regular canned vegetables (not low-sodium or reduced-sodium). Regular canned tomato sauce and paste (not low-sodium or reduced-sodium). Regular tomato and vegetable juice (not low-sodium or reduced-sodium). Frozen vegetables in sauces. Meats and other protein foods Meat or fish that is salted, canned, smoked, spiced, or pickled. Bacon, ham, sausage, hotdogs, corned beef, chipped beef, packaged lunch meats, salt pork, jerky, pickled herring, anchovies, regular canned tuna, sardines, salted nuts. Dairy Processed cheese and cheese spreads. Cheese curds. Blue cheese. Feta cheese. String cheese. Regular cottage cheese. Buttermilk. Canned milk. Fats and oils Salted butter. Regular margarine. Ghee. Bacon fat. Seasonings and other foods Onion salt, garlic salt, seasoned salt, table salt, and sea salt. Canned and packaged gravies. Worcestershire sauce. Tartar sauce. Barbecue sauce. Teriyaki sauce. Soy sauce, including reduced-sodium. Steak sauce. Fish sauce. Oyster sauce. Cocktail sauce. Horseradish that you find on the shelf. Regular ketchup and mustard. Meat flavorings and tenderizers. Bouillon cubes. Hot sauce and Tabasco sauce. Premade or packaged marinades. Premade or packaged taco seasonings. Relishes. Regular salad dressings. Salsa. Potato and tortilla chips. Corn chips  and puffs. Salted popcorn and pretzels. Canned or dried soups. Pizza. Frozen entrees and pot pies. Summary  Eating less sodium can help lower your blood pressure, reduce swelling, and protect your heart, liver, and kidneys.  Most people on this plan should limit their sodium intake to 1,500-2,000 mg (milligrams) of sodium each day.  Canned, boxed, and frozen foods are high in sodium. Restaurant foods, fast foods, and pizza are also very high in sodium. You also get sodium by adding salt to food.  Try to cook at home, eat more fresh fruits and vegetables, and eat less fast food, canned, processed, or prepared foods. This information is not intended to replace advice given to you by your health care provider. Make sure you discuss any questions you have with your health care provider. Document Revised: 04/21/2017 Document Reviewed: 05/02/2016 Elsevier Patient Education  2020 [employer].

## 2019-06-21 ENCOUNTER — Telehealth: Payer: Self-pay | Admitting: Pharmacist

## 2019-06-21 NOTE — Telephone Encounter (Signed)
error 

## 2019-06-24 ENCOUNTER — Encounter: Payer: Self-pay | Admitting: Internal Medicine

## 2019-06-24 DIAGNOSIS — I10 Essential (primary) hypertension: Secondary | ICD-10-CM | POA: Insufficient documentation

## 2019-06-24 NOTE — Progress Notes (Signed)
Chief Complaint  Patient presents with  . Follow-up   In office visit 06/20/19  1. Atrial flutter/fib in hospital with sepsis still no f/u cards Dr. Ubaldo Glassing she has not started eliquis due to cost and she lost her job around the time she was hospitalized we reviewed starting aspirin 81 mg in the interim today as chadsvasc score is 3 pts today  -she is agreeable to f/u with Dr. Ubaldo Glassing now   2. HTN BP elevated on home meter which she brings in today for 14 days 167/93 HR 57,  144/79 HR 52, 47/78 HR 54, 153/82 HR 56, 141/78 HR 68, 141/80 HR 67, 174/81 HR 67, 187/93 HR 64, 155/79 HR 69, 160/85 HR 61, 164/86 HR 62, 180/90 HR 62, 168/92 HR 64, 188/89 HR 65   She is on lopressor 25 mg bid was not able to tolerate diltiazem for #1 due to rash   3. Repeat CXR c/w lung mass pt did not sch CT chest due to cost and loss of job but agreeable to reschedule today informed melissa sch 07/02/19 at 2:30 pm she does have h/o smoking   4. S/p hospitalization she reports she got down to 96 lbs but now back up to 105 lbs     Review of Systems  Constitutional: Positive for weight loss.  HENT: Negative for hearing loss.   Eyes: Negative for blurred vision.  Respiratory: Negative for shortness of breath.   Cardiovascular: Negative for chest pain and palpitations.  Skin: Negative for rash.  Neurological: Negative for headaches.  Psychiatric/Behavioral:       +stress due to loss of job     Past Medical History:  Diagnosis Date  . Arthritis    Osteoarthritis/Rheumatoid  . Cancer (HCC)    Skin  . Cataract   . Elevated blood pressure   . Fibrocystic breast disease   . Glaucoma   . Hypercholesterolemia   . Thyroid disease    Hypothyroid   Past Surgical History:  Procedure Laterality Date  . ABDOMINAL HYSTERECTOMY     Total abd. hysterectomu and bilateral salping-oophorectomy  . EYE SURGERY Right    Glaucoma surgery  . HEMORRHOID SURGERY    . status post dilatation and curettage  1975  . TONSILLECTOMY     . TUBAL LIGATION     Family History  Problem Relation Age of Onset  . CAD Mother   . Arthritis Mother        Rheumatoid Arthritis  . Heart failure Mother   . Arthritis Father        Rheumatoid/Osteoarthritis and Blindness, bleeding Tendencies,Glaucoma  . CAD Father   . Heart failure Father   . Hypertension Maternal Grandmother   . COPD Neg Hx    Social History   Socioeconomic History  . Marital status: Divorced    Spouse name: Not on file  . Number of children: Not on file  . Years of education: Not on file  . Highest education level: Not on file  Occupational History  . Not on file  Tobacco Use  . Smoking status: Current Every Day Smoker    Packs/day: 0.25    Types: Cigarettes  . Smokeless tobacco: Never Used  Substance and Sexual Activity  . Alcohol use: Yes    Comment: wine occasional  . Drug use: No  . Sexual activity: Not on file  Other Topics Concern  . Not on file  Social History Narrative  . Not on file   Social Determinants of  Health   Financial Resource Strain:   . Difficulty of Paying Living Expenses: Not on file  Food Insecurity:   . Worried About Charity fundraiser in the Last Year: Not on file  . Ran Out of Food in the Last Year: Not on file  Transportation Needs:   . Lack of Transportation (Medical): Not on file  . Lack of Transportation (Non-Medical): Not on file  Physical Activity: Sufficiently Active  . Days of Exercise per Week: 5 days  . Minutes of Exercise per Session: 30 min  Stress: No Stress Concern Present  . Feeling of Stress : Only a little  Social Connections:   . Frequency of Communication with Friends and Family: Not on file  . Frequency of Social Gatherings with Friends and Family: Not on file  . Attends Religious Services: Not on file  . Active Member of Clubs or Organizations: Not on file  . Attends Archivist Meetings: Not on file  . Marital Status: Not on file  Intimate Partner Violence:   . Fear of Current  or Ex-Partner: Not on file  . Emotionally Abused: Not on file  . Physically Abused: Not on file  . Sexually Abused: Not on file   Current Meds  Medication Sig  . Adalimumab 40 MG/0.8ML PNKT Inject 40 mg into the skin every 14 (fourteen) days.  . Calcium Carbonate (CALCIUM 600 PO) Take 600 mg by mouth daily.  . Coenzyme Q-10 100 MG capsule Take 100 mg by mouth daily.  Marland Kitchen levothyroxine (SYNTHROID) 50 MCG tablet Take 1 tablet (50 mcg total) by mouth daily before breakfast.  . metoprolol tartrate (LOPRESSOR) 25 MG tablet Take 1 tablet (25 mg total) by mouth 2 (two) times daily.  . Multiple Vitamin (MULTIVITAMIN) tablet Take 1 tablet by mouth daily.  . naproxen sodium (ALEVE) 220 MG tablet Take 220 mg by mouth 2 (two) times daily with a meal.   Allergies  Allergen Reactions  . Codeine Nausea And Vomiting    Can take cough syrups  . Diltiazem     Rash    Recent Results (from the past 2160 hour(s))  Culture, blood (Routine x 2)     Status: None   Collection Time: 04/04/19  3:28 PM   Specimen: BLOOD  Result Value Ref Range   Specimen Description BLOOD RIGHT ANTECUBITAL    Special Requests      BOTTLES DRAWN AEROBIC AND ANAEROBIC Blood Culture adequate volume   Culture      NO GROWTH 5 DAYS Performed at [facility], [address] [place], [place] [address]    Report Status 04/09/2019 FINAL   Comprehensive metabolic panel     Status: Abnormal   Collection Time: 04/04/19  3:30 PM  Result Value Ref Range   Sodium 131 (L) 135 - 145 mmol/L   Potassium 4.0 3.5 - 5.1 mmol/L   Chloride 94 (L) 98 - 111 mmol/L   CO2 23 22 - 32 mmol/L   Glucose, Bld 150 (H) 70 - 99 mg/dL   BUN 14 8 - 23 mg/dL   Creatinine, Ser 1.10 (H) 0.44 - 1.00 mg/dL   Calcium 9.3 8.9 - 10.3 mg/dL   Total Protein 7.8 6.5 - 8.1 g/dL   Albumin 3.8 3.5 - 5.0 g/dL   AST 59 (H) 15 - 41 U/L   ALT 60 (H) 0 - 44 U/L   Alkaline Phosphatase 70 38 - 126 U/L   Total Bilirubin 1.2 0.3 - 1.2 mg/dL  GFR calc non Af  Amer 49 (L) >60 mL/min   GFR calc Af Amer 57 (L) >60 mL/min   Anion gap 14 5 - 15    Comment: Performed at [facility], [address] [place], [place] [address]  Lactic acid, plasma     Status: None   Collection Time: 04/04/19  3:30 PM  Result Value Ref Range   Lactic Acid, Venous 1.8 0.5 - 1.9 mmol/L    Comment: Performed at [facility], [address] [place], [place] [address]  CBC with Differential     Status: Abnormal   Collection Time: 04/04/19  3:30 PM  Result Value Ref Range   WBC 24.6 (H) 4.0 - 10.5 K/uL   RBC 4.83 3.87 - 5.11 MIL/uL   Hemoglobin 15.7 (H) 12.0 - 15.0 g/dL   HCT 44.7 36.0 - 46.0 %   MCV 92.5 80.0 - 100.0 fL   MCH 32.5 26.0 - 34.0 pg   MCHC 35.1 30.0 - 36.0 g/dL   RDW 12.9 11.5 - 15.5 %   Platelets 156 150 - 400 K/uL   nRBC 0.0 0.0 - 0.2 %   Neutrophils Relative % 95 %   Neutro Abs 23.2 (H) 1.7 - 7.7 K/uL   Lymphocytes Relative 2 %   Lymphs Abs 0.5 (L) 0.7 - 4.0 K/uL   Monocytes Relative 2 %   Monocytes Absolute 0.6 0.1 - 1.0 K/uL   Eosinophils Relative 0 %   Eosinophils Absolute 0.0 0.0 - 0.5 K/uL   Basophils Relative 0 %   Basophils Absolute 0.1 0.0 - 0.1 K/uL   Immature Granulocytes 1 %   Abs Immature Granulocytes 0.29 (H) 0.00 - 0.07 K/uL    Comment: Performed at [facility], [address] [place], [place] [address]  Protime-INR     Status: Abnormal   Collection Time: 04/04/19  3:30 PM  Result Value Ref Range   Prothrombin Time 17.0 (H) 11.4 - 15.2 seconds   INR 1.4 (H) 0.8 - 1.2    Comment: (NOTE) INR goal varies based on device and disease states. Performed at [facility], [address] [place], [place] [address]   Culture, blood (Routine x 2)     Status: None   Collection Time: 04/04/19  3:31 PM   Specimen: BLOOD  Result Value Ref Range   Specimen Description BLOOD LEFT ANTECUBITAL    Special Requests      BOTTLES DRAWN AEROBIC AND ANAEROBIC Blood Culture results may not be optimal due  to an excessive volume of blood received in culture bottles   Culture      NO GROWTH 5 DAYS Performed at [facility], [address], [place], [place] [address]    Report Status 04/09/2019 FINAL   SARS CORONAVIRUS 2 (TAT 6-24 HRS) Nasopharyngeal Nasopharyngeal Swab     Status: None   Collection Time: 04/04/19  4:53 PM   Specimen: Nasopharyngeal Swab  Result Value Ref Range   SARS Coronavirus 2 NEGATIVE NEGATIVE    Comment: (NOTE) SARS-CoV-2 target nucleic acids are NOT DETECTED. The SARS-CoV-2 RNA is generally detectable in upper and lower respiratory specimens during the acute phase of infection. Negative results do not preclude SARS-CoV-2 infection, do not rule out co-infections with other pathogens, and should not be used as the sole basis for treatment or other patient management decisions. Negative results must be combined with clinical observations, patient history, and epidemiological information. The expected result is Negative. Fact Sheet for  Patients: SugarRoll.be Fact Sheet for Healthcare Providers: https://www.woods-mathews.com/ This test is not yet approved or cleared by the [place] FDA and  has been authorized for detection and/or diagnosis of SARS-CoV-2 by FDA under an Emergency Use Authorization (EUA). This EUA will remain  in effect (meaning this test can be used) for the duration of the COVID-19 declaration under Section 56 4(b)(1) of the Act, 21 U.S.C. section 360bbb-3(b)(1), unless the authorization is terminated or revoked sooner. Performed at [facility], [place] [address], [place], [place] [address]   Urinalysis, Complete w Microscopic     Status: Abnormal   Collection Time: 04/04/19  7:00 PM  Result Value Ref Range   Color, Urine YELLOW (A) YELLOW   APPearance CLOUDY (A) CLEAR   Specific Gravity, Urine 1.021 1.005 - 1.030   pH 5.0 5.0 - 8.0   Glucose, UA NEGATIVE NEGATIVE mg/dL   Hgb urine  dipstick SMALL (A) NEGATIVE   Bilirubin Urine NEGATIVE NEGATIVE   Ketones, ur NEGATIVE NEGATIVE mg/dL   Protein, ur 100 (A) NEGATIVE mg/dL   Nitrite NEGATIVE NEGATIVE   Leukocytes,Ua NEGATIVE NEGATIVE   RBC / HPF 0-5 0 - 5 RBC/hpf   WBC, UA 6-10 0 - 5 WBC/hpf   Bacteria, UA FEW (A) NONE SEEN   Squamous Epithelial / LPF 6-10 0 - 5   Mucus PRESENT    Hyaline Casts, UA PRESENT    Granular Casts, UA PRESENT     Comment: Performed at [facility], [address] [place], [place] [address]  Urine culture     Status: Abnormal   Collection Time: 04/04/19  7:00 PM   Specimen: Urine, Clean Catch  Result Value Ref Range   Specimen Description      URINE, CLEAN CATCH Performed at [facility], [address] [place], [place] [address]    Special Requests      Normal Performed at [facility], [place], [place] [address]    Culture (A)     <10,000 COLONIES/mL INSIGNIFICANT GROWTH Performed at [facility] [address] [place], [place] [address]    Report Status 04/06/2019 FINAL   CBC WITH DIFFERENTIAL     Status: Abnormal   Collection Time: 04/05/19  6:45 AM  Result Value Ref Range   WBC 15.9 (H) 4.0 - 10.5 K/uL   RBC 4.22 3.87 - 5.11 MIL/uL   Hemoglobin 13.6 12.0 - 15.0 g/dL   HCT 38.9 36.0 - 46.0 %   MCV 92.2 80.0 - 100.0 fL   MCH 32.2 26.0 - 34.0 pg   MCHC 35.0 30.0 - 36.0 g/dL   RDW 13.1 11.5 - 15.5 %   Platelets 127 (L) 150 - 400 K/uL    Comment: Immature Platelet Fraction may be clinically indicated, consider ordering this additional test [id number]    nRBC 0.0 0.0 - 0.2 %   Neutrophils Relative % 93 %   Neutro Abs 14.8 (H) 1.7 - 7.7 K/uL   Lymphocytes Relative 3 %   Lymphs Abs 0.5 (L) 0.7 - 4.0 K/uL   Monocytes Relative 3 %   Monocytes Absolute 0.4 0.1 - 1.0 K/uL   Eosinophils Relative 0 %   Eosinophils Absolute 0.0 0.0 - 0.5 K/uL   Basophils Relative 0 %   Basophils Absolute 0.0 0.0 - 0.1 K/uL   WBC  Morphology MILD LEFT SHIFT (1-5% METAS, OCC MYELO, OCC BANDS)     Comment: VACUOLATED NEUTROPHILS   RBC Morphology  MORPHOLOGY UNREMARKABLE    Smear Review Normal platelet morphology    Immature Granulocytes 1 %   Abs Immature Granulocytes 0.10 (H) 0.00 - 0.07 K/uL    Comment: Performed at [facility], [address] [place], [place] [address]  Comprehensive metabolic panel     Status: Abnormal   Collection Time: 04/05/19  8:40 AM  Result Value Ref Range   Sodium 133 (L) 135 - 145 mmol/L   Potassium 3.2 (L) 3.5 - 5.1 mmol/L   Chloride 101 98 - 111 mmol/L   CO2 21 (L) 22 - 32 mmol/L   Glucose, Bld 124 (H) 70 - 99 mg/dL   BUN 18 8 - 23 mg/dL   Creatinine, Ser 1.02 (H) 0.44 - 1.00 mg/dL   Calcium 8.0 (L) 8.9 - 10.3 mg/dL   Total Protein 6.7 6.5 - 8.1 g/dL   Albumin 3.0 (L) 3.5 - 5.0 g/dL   AST 143 (H) 15 - 41 U/L   ALT 93 (H) 0 - 44 U/L   Alkaline Phosphatase 54 38 - 126 U/L   Total Bilirubin 1.1 0.3 - 1.2 mg/dL   GFR calc non Af Amer 54 (L) >60 mL/min   GFR calc Af Amer >60 >60 mL/min   Anion gap 11 5 - 15    Comment: Performed at [facility], [address] [place], [place] [address]  Magnesium     Status: None   Collection Time: 04/05/19  4:01 PM  Result Value Ref Range   Magnesium 2.0 1.7 - 2.4 mg/dL    Comment: Performed at [facility], [address] [place], [place] [address]  CBC with Differential/Platelet     Status: Abnormal   Collection Time: 04/06/19  4:27 AM  Result Value Ref Range   WBC 13.8 (H) 4.0 - 10.5 K/uL   RBC 4.77 3.87 - 5.11 MIL/uL   Hemoglobin 15.2 (H) 12.0 - 15.0 g/dL   HCT 44.6 36.0 - 46.0 %   MCV 93.5 80.0 - 100.0 fL   MCH 31.9 26.0 - 34.0 pg   MCHC 34.1 30.0 - 36.0 g/dL   RDW 13.2 11.5 - 15.5 %   Platelets 130 (L) 150 - 400 K/uL   nRBC 0.0 0.0 - 0.2 %   Neutrophils Relative % 93 %   Neutro Abs 12.7 (H) 1.7 - 7.7 K/uL   Lymphocytes Relative 4 %   Lymphs Abs 0.6 (L) 0.7 - 4.0 K/uL   Monocytes Relative 2 %    Monocytes Absolute 0.3 0.1 - 1.0 K/uL   Eosinophils Relative 0 %   Eosinophils Absolute 0.0 0.0 - 0.5 K/uL   Basophils Relative 0 %   Basophils Absolute 0.1 0.0 - 0.1 K/uL   WBC Morphology MORPHOLOGY UNREMARKABLE    RBC Morphology MORPHOLOGY UNREMARKABLE    Smear Review Normal platelet morphology    Immature Granulocytes 1 %   Abs Immature Granulocytes 0.11 (H) 0.00 - 0.07 K/uL    Comment: Performed at [facility], [address] [place], [place] [address]  Comprehensive metabolic panel     Status: Abnormal   Collection Time: 04/06/19  4:27 AM  Result Value Ref Range   Sodium 132 (L) 135 - 145 mmol/L   Potassium 3.7 3.5 - 5.1 mmol/L   Chloride 101 98 - 111 mmol/L   CO2 19 (L) 22 - 32 mmol/L   Glucose, Bld 90 70 - 99 mg/dL   BUN 15 8 - 23 mg/dL   Creatinine, Ser  0.83 0.44 - 1.00 mg/dL   Calcium 8.2 (L) 8.9 - 10.3 mg/dL   Total Protein 7.1 6.5 - 8.1 g/dL   Albumin 3.0 (L) 3.5 - 5.0 g/dL   AST 167 (H) 15 - 41 U/L   ALT 110 (H) 0 - 44 U/L   Alkaline Phosphatase 61 38 - 126 U/L   Total Bilirubin 0.8 0.3 - 1.2 mg/dL   GFR calc non Af Amer >60 >60 mL/min   GFR calc Af Amer >60 >60 mL/min   Anion gap 12 5 - 15    Comment: Performed at [facility], [address] [place], [place] [address]  Comprehensive metabolic panel     Status: Abnormal   Collection Time: 04/07/19  4:05 AM  Result Value Ref Range   Sodium 131 (L) 135 - 145 mmol/L   Potassium 3.7 3.5 - 5.1 mmol/L   Chloride 99 98 - 111 mmol/L   CO2 24 22 - 32 mmol/L   Glucose, Bld 92 70 - 99 mg/dL   BUN 14 8 - 23 mg/dL   Creatinine, Ser 0.76 0.44 - 1.00 mg/dL   Calcium 7.9 (L) 8.9 - 10.3 mg/dL   Total Protein 5.5 (L) 6.5 - 8.1 g/dL   Albumin 2.3 (L) 3.5 - 5.0 g/dL   AST 210 (H) 15 - 41 U/L   ALT 111 (H) 0 - 44 U/L   Alkaline Phosphatase 55 38 - 126 U/L   Total Bilirubin 0.8 0.3 - 1.2 mg/dL   GFR calc non Af Amer >60 >60 mL/min   GFR calc Af Amer >60 >60 mL/min   Anion gap 8 5 - 15    Comment:  Performed at [facility], [address] [place], [place] [address]  TSH     Status: None   Collection Time: 04/07/19  4:05 AM  Result Value Ref Range   TSH 1.853 0.350 - 4.500 uIU/mL    Comment: Performed by a 3rd Generation assay with a functional sensitivity of <=0.01 uIU/mL. Performed at [facility], [address] [place], [place] [address]   ECHOCARDIOGRAM COMPLETE     Status: None   Collection Time: 04/07/19  9:24 AM  Result Value Ref Range   Weight 1,686.08 oz   Height 61.5 in   BP 153/71 mmHg  Comprehensive metabolic panel     Status: Abnormal   Collection Time: 04/08/19  4:17 AM  Result Value Ref Range   Sodium 133 (L) 135 - 145 mmol/L   Potassium 3.8 3.5 - 5.1 mmol/L   Chloride 101 98 - 111 mmol/L   CO2 23 22 - 32 mmol/L   Glucose, Bld 121 (H) 70 - 99 mg/dL   BUN 15 8 - 23 mg/dL   Creatinine, Ser 0.61 0.44 - 1.00 mg/dL   Calcium 7.9 (L) 8.9 - 10.3 mg/dL   Total Protein 5.3 (L) 6.5 - 8.1 g/dL   Albumin 2.1 (L) 3.5 - 5.0 g/dL   AST 693 (H) 15 - 41 U/L   ALT 274 (H) 0 - 44 U/L   Alkaline Phosphatase 84 38 - 126 U/L   Total Bilirubin 0.9 0.3 - 1.2 mg/dL   GFR calc non Af Amer >60 >60 mL/min   GFR calc Af Amer >60 >60 mL/min   Anion gap 9 5 - 15    Comment: Performed at [facility], [address] [place], [place] [address]  CBC with Differential/Platelet     Status: Abnormal   Collection  Time: 04/08/19  4:17 AM  Result Value Ref Range   WBC 7.5 4.0 - 10.5 K/uL   RBC 3.83 (L) 3.87 - 5.11 MIL/uL   Hemoglobin 12.1 12.0 - 15.0 g/dL   HCT 35.1 (L) 36.0 - 46.0 %   MCV 91.6 80.0 - 100.0 fL   MCH 31.6 26.0 - 34.0 pg   MCHC 34.5 30.0 - 36.0 g/dL   RDW 13.4 11.5 - 15.5 %   Platelets 104 (L) 150 - 400 K/uL    Comment: Immature Platelet Fraction may be clinically indicated, consider ordering this additional test [id number]    nRBC 0.0 0.0 - 0.2 %   Neutrophils Relative % 82 %   Neutro Abs 6.2 1.7 - 7.7 K/uL   Lymphocytes Relative 11  %   Lymphs Abs 0.8 0.7 - 4.0 K/uL   Monocytes Relative 5 %   Monocytes Absolute 0.4 0.1 - 1.0 K/uL   Eosinophils Relative 0 %   Eosinophils Absolute 0.0 0.0 - 0.5 K/uL   Basophils Relative 0 %   Basophils Absolute 0.0 0.0 - 0.1 K/uL   WBC Morphology MORPHOLOGY UNREMARKABLE    RBC Morphology MORPHOLOGY UNREMARKABLE    Smear Review Normal platelet morphology    Immature Granulocytes 2 %   Abs Immature Granulocytes 0.15 (H) 0.00 - 0.07 K/uL    Comment: Performed at [facility], [address] [place], [place] [address]  Magnesium     Status: None   Collection Time: 04/08/19  4:17 AM  Result Value Ref Range   Magnesium 1.9 1.7 - 2.4 mg/dL    Comment: Performed at [facility], [address] [place], [place] [address]  Hepatitis panel, acute     Status: Abnormal   Collection Time: 04/08/19  1:23 PM  Result Value Ref Range   Hepatitis B Surface Ag NEGATIVE (A) NON REACTIVE    Comment: Performed at [facility]   HCV Ab <0.1 (A) 0.0 - 0.9 s/co ratio    Comment: (NOTE)                                  Negative:     < 0.8                             Indeterminate: 0.8 - 0.9                                  Positive:     > 0.9 The CDC recommends that a positive HCV antibody result be followed up with a HCV Nucleic Acid Amplification test (846659). Performed At: [facility] [place], [place] [id number] Rush Farmer MD [id number]    Hep A IgM Negative (A) Negative   Hep B C IgM NEGATIVE (A) NON REACTIVE    Comment: Performed at [employer] Performed at [facility], [place] [address], [place], [place] [address]   Comprehensive metabolic panel     Status: Abnormal   Collection Time: 04/10/19  3:30 AM  Result Value Ref Range   Sodium 136 135 - 145 mmol/L   Potassium 3.4 (L) 3.5 - 5.1 mmol/L   Chloride 99 98 - 111 mmol/L   CO2 27 22 - 32 mmol/L   Glucose, Bld 113 (H) 70 - 99 mg/dL  BUN 9 8 - 23 mg/dL   Creatinine,  Ser 0.66 0.44 - 1.00 mg/dL   Calcium 8.1 (L) 8.9 - 10.3 mg/dL   Total Protein 6.3 (L) 6.5 - 8.1 g/dL   Albumin 2.4 (L) 3.5 - 5.0 g/dL   AST 314 (H) 15 - 41 U/L   ALT 253 (H) 0 - 44 U/L   Alkaline Phosphatase 138 (H) 38 - 126 U/L   Total Bilirubin 1.0 0.3 - 1.2 mg/dL   GFR calc non Af Amer >60 >60 mL/min   GFR calc Af Amer >60 >60 mL/min   Anion gap 10 5 - 15    Comment: Performed at [facility], [address] [place], [place] [address]  CBC     Status: Abnormal   Collection Time: 05/13/19 10:52 AM  Result Value Ref Range   WBC 11.5 (H) 4.0 - 10.5 K/uL   RBC 4.49 3.87 - 5.11 MIL/uL   Hemoglobin 14.0 12.0 - 15.0 g/dL   HCT 42.8 36.0 - 46.0 %   MCV 95.3 80.0 - 100.0 fL   MCH 31.2 26.0 - 34.0 pg   MCHC 32.7 30.0 - 36.0 g/dL   RDW 13.7 11.5 - 15.5 %   Platelets 214 150 - 400 K/uL   nRBC 0.0 0.0 - 0.2 %    Comment: Performed at [facility], [address] [place], [place] [address]  Comprehensive metabolic panel     Status: Abnormal   Collection Time: 05/13/19 10:52 AM  Result Value Ref Range   Sodium 137 135 - 145 mmol/L   Potassium 4.0 3.5 - 5.1 mmol/L   Chloride 103 98 - 111 mmol/L   CO2 25 22 - 32 mmol/L   Glucose, Bld 107 (H) 70 - 99 mg/dL   BUN 13 8 - 23 mg/dL   Creatinine, Ser 0.76 0.44 - 1.00 mg/dL   Calcium 9.0 8.9 - 10.3 mg/dL   Total Protein 8.0 6.5 - 8.1 g/dL   Albumin 4.1 3.5 - 5.0 g/dL   AST 32 15 - 41 U/L   ALT 32 0 - 44 U/L   Alkaline Phosphatase 89 38 - 126 U/L   Total Bilirubin 0.6 0.3 - 1.2 mg/dL   GFR calc non Af Amer >60 >60 mL/min   GFR calc Af Amer >60 >60 mL/min   Anion gap 9 5 - 15    Comment: Performed at [facility], [address] [place], [place] [address]   Objective  Body mass index is 19.88 kg/m. Wt Readings from Last 3 Encounters:  06/20/19 105 lb 3.2 oz (47.7 kg)  05/08/19 98 lb (44.5 kg)  05/03/19 97 lb 12.8 oz (44.4 kg)   Temp Readings from Last 3 Encounters:  06/20/19 (!) 97.3 F (36.3 C)  (Skin)  04/10/19 97.9 F (36.6 C) (Oral)  01/15/19 97.9 F (36.6 C) (Oral)   BP Readings from Last 3 Encounters:  06/20/19 (!) 160/102  04/10/19 134/78  01/15/19 110/70   Pulse Readings from Last 3 Encounters:  06/20/19 68  04/10/19 (!) 107  01/15/19 61    Physical Exam Vitals and nursing note reviewed.  Constitutional:      Appearance: Normal appearance. She is well-developed and well-groomed.     Comments: Thin body habitus    HENT:     Head: Normocephalic and atraumatic.  Eyes:     Conjunctiva/sclera: Conjunctivae normal.     Pupils: Pupils are equal, round, and reactive to light.  Cardiovascular:  Rate and Rhythm: Normal rate and regular rhythm.     Heart sounds: Normal heart sounds. No murmur.     Comments: +NSR today    Pulmonary:     Effort: Pulmonary effort is normal.     Breath sounds: Normal breath sounds.  Musculoskeletal:     Right lower leg: No edema.     Left lower leg: No edema.  Skin:    General: Skin is warm and dry.  Neurological:     General: No focal deficit present.     Mental Status: She is alert and oriented to person, place, and time. Mental status is at baseline.     Gait: Gait normal.  Psychiatric:        Attention and Perception: Attention and perception normal.        Mood and Affect: Mood and affect normal.        Speech: Speech normal.        Behavior: Behavior normal. Behavior is cooperative.        Thought Content: Thought content normal.        Cognition and Memory: Cognition and memory normal.        Judgment: Judgment normal.     Assessment  Plan  Atrial flutter, unspecified type (HCC) chadsvasc score 3 pts  Atrial fibrillation, unspecified type (HCC) Essential hypertension - Plan: losartan-hydrochlorothiazide (HYZAAR) 50-12.5 MG tablet Bradycardia  -rec start 1/2 pill hyzaar 50-12.5 continue to log BP and f/u with PCP in 2 weeks in office and if works provider more quantity  Cont lopressor 25 mg bid  Will try to  reschedule appt with Dr. Ubaldo Glassing  Aspirin 81 mg for now  Has not started eliquis 5 mg bid 2/2 cost   Lung mass  -pt agreeable to resch CT chest resch 07/02/19 2:30 f/u persistent lung mass  Melissa will get re authorized   HM -f/u PCP vaccines, mammo, colonoscopy, dexa, skin HM needs  Repeat CBC and urine culture prn in future with PCP to f/u 05/12/20 leukocytosis and urine 04/04/19 rec recollection    Time spent in office 30 minutes  Provider: Dr. Olivia Mackie McLean-Scocuzza-Internal Medicine

## 2019-06-25 ENCOUNTER — Other Ambulatory Visit: Payer: Self-pay | Admitting: Internal Medicine

## 2019-06-25 DIAGNOSIS — I10 Essential (primary) hypertension: Secondary | ICD-10-CM

## 2019-06-25 MED ORDER — LOSARTAN POTASSIUM-HCTZ 50-12.5 MG PO TABS: 0.5000 | ORAL_TABLET | Freq: Every day | ORAL | 0 refills | Status: DC

## 2019-07-02 ENCOUNTER — Other Ambulatory Visit: Payer: Self-pay

## 2019-07-02 ENCOUNTER — Ambulatory Visit
Admission: RE | Admit: 2019-07-02 | Discharge: 2019-07-02 | Disposition: A | Discharge status: Home / Self Care | Payer: Medicare HMO | Source: Ambulatory Visit | Attending: Family Medicine | Admitting: Family Medicine

## 2019-07-02 DIAGNOSIS — R9389 Abnormal findings on diagnostic imaging of other specified body structures: Secondary | ICD-10-CM | POA: Insufficient documentation

## 2019-07-02 DIAGNOSIS — R918 Other nonspecific abnormal finding of lung field: Secondary | ICD-10-CM | POA: Diagnosis not present

## 2019-07-03 ENCOUNTER — Encounter: Payer: Self-pay | Admitting: *Deleted

## 2019-07-03 ENCOUNTER — Telehealth: Payer: Self-pay | Admitting: Oncology

## 2019-07-03 ENCOUNTER — Telehealth: Payer: Self-pay | Admitting: Family Medicine

## 2019-07-03 DIAGNOSIS — R918 Other nonspecific abnormal finding of lung field: Secondary | ICD-10-CM

## 2019-07-03 NOTE — Progress Notes (Signed)
  Oncology Nurse Navigator Documentation  Navigator Location: [facility] (07/03/19 1400) Referral Date to RadOnc/MedOnc: 07/03/19 (07/03/19 1400) )Navigator Encounter Type: Introductory Phone Call (07/03/19 1400)   Abnormal Finding Date: 07/02/19 (07/03/19 1400)                   Treatment Phase: Abnormal Scans (07/03/19 1400) Barriers/Navigation Needs: Coordination of Care (07/03/19 1400)   Interventions: Coordination of Care (07/03/19 1400)   Coordination of Care: Appts (07/03/19 1400)           phone call made to patient to schedule for new patient appt with Dr. Grayland Ormond in the lung clinic. Reviewed upcoming appts. Contact info given and instructed to call with any further questions or needs. Pt verbalized understanding. Will follow up at next clinic visit. Nothing further needed at this time.       Time Spent with Patient: 30 (07/03/19 1400)

## 2019-07-03 NOTE — Addendum Note (Signed)
Addended by: Telford Nab on: 07/03/2019 02:09 PM   Modules accepted: Orders

## 2019-07-03 NOTE — Telephone Encounter (Signed)
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 She has a history of tobacco use and had an abnormal CXR for evaluation for possibly pneumonia.    Patient has been referred to the [facility] and will require a PET scan for further evaluation and to optimize management.  PET scan is also needed to assist in further diagnostic studies including possible biopsy.

## 2019-07-03 NOTE — Telephone Encounter (Addendum)
I called and spoke with the patient.  I discussed the CT findings of a persistent masslike opacity in her right lung.  Discussed that this is concerning for a lung cancer.  Also discussed an enlarged lymph node is present which is concerning as well.  Advised that we need to refer her to oncology to start a work-up of this.  A referral will be urgently placed.  I spoke with our referral coordinator to get this sent over to oncology urgently.

## 2019-07-03 NOTE — Telephone Encounter (Signed)
CT results called in  By radiology copy impression below.  IMPRESSION: 1. Persistent masslike opacity in the right upper lung measuring 4.1 x 2.7 x 3.8 cm. Persistence of this finding and imaging features on today's exam are highly worrisome for possible bronchogenic carcinoma. Furthermore there is an enlarged precarinal lymph node measuring up to 11 mm in size concerning for metastatic disease. Recommend referral to multi disciplinary clinic for further evaluation and workup. 2. Coronary artery and aortic atherosclerosis. 3. Hypoattenuating lesion in the posterior left lobe of the liver is unchanged from prior ultrasound and MRI. 4. Trace pericardial thickening versus effusion. Small pericardial cyst. 5. Aortic Atherosclerosis (ICD10-I70.0).  These results will be called to the ordering clinician or representative by the Radiologist Assistant, and communication documented in the PACS or zVision Dashboard.

## 2019-07-04 ENCOUNTER — Telehealth: Payer: Self-pay

## 2019-07-04 NOTE — Telephone Encounter (Signed)
07/04/2019 Spoke with patient about assisting with online application for [employer] .  She did not have time this week but said she would call me sometime next week when she has time.  The patient has several doctor appointments and stated it would be better for her to call me.  Will follow-up next week if I do not hear from her.  Ambrose Mantle [phone]

## 2019-07-04 NOTE — Progress Notes (Signed)
Patient prescreened for appointment. Patient has no concerns or questions.  

## 2019-07-05 ENCOUNTER — Ambulatory Visit (INDEPENDENT_AMBULATORY_CARE_PROVIDER_SITE_OTHER): Payer: Medicare HMO | Admitting: Pulmonary Disease

## 2019-07-05 ENCOUNTER — Inpatient Hospital Stay: Payer: Medicare HMO | Attending: Oncology | Admitting: Oncology

## 2019-07-05 ENCOUNTER — Encounter: Payer: Self-pay | Admitting: Pulmonary Disease

## 2019-07-05 ENCOUNTER — Telehealth: Payer: Self-pay | Admitting: Pulmonary Disease

## 2019-07-05 ENCOUNTER — Ambulatory Visit (INDEPENDENT_AMBULATORY_CARE_PROVIDER_SITE_OTHER): Payer: Medicare HMO | Admitting: Family Medicine

## 2019-07-05 ENCOUNTER — Other Ambulatory Visit: Payer: Self-pay

## 2019-07-05 ENCOUNTER — Encounter: Payer: Self-pay | Admitting: Family Medicine

## 2019-07-05 VITALS — BP 157/83 | HR 60 | Temp 97.6°F | Wt 106.3 lb

## 2019-07-05 VITALS — BP 157/83 | HR 60

## 2019-07-05 VITALS — BP 130/70 | HR 67 | Temp 96.3°F | Ht 61.0 in | Wt 102.0 lb

## 2019-07-05 DIAGNOSIS — I10 Essential (primary) hypertension: Secondary | ICD-10-CM

## 2019-07-05 DIAGNOSIS — F17210 Nicotine dependence, cigarettes, uncomplicated: Secondary | ICD-10-CM | POA: Diagnosis not present

## 2019-07-05 DIAGNOSIS — I4892 Unspecified atrial flutter: Secondary | ICD-10-CM

## 2019-07-05 DIAGNOSIS — F419 Anxiety disorder, unspecified: Secondary | ICD-10-CM | POA: Diagnosis not present

## 2019-07-05 DIAGNOSIS — E039 Hypothyroidism, unspecified: Secondary | ICD-10-CM

## 2019-07-05 DIAGNOSIS — R918 Other nonspecific abnormal finding of lung field: Secondary | ICD-10-CM

## 2019-07-05 DIAGNOSIS — E079 Disorder of thyroid, unspecified: Secondary | ICD-10-CM | POA: Insufficient documentation

## 2019-07-05 LAB — BASIC METABOLIC PANEL
BUN: 15 mg/dL (ref 6–23)
CO2: 30 mEq/L (ref 19–32)
Calcium: 10.3 mg/dL (ref 8.4–10.5)
Chloride: 98 mEq/L (ref 96–112)
Creatinine, Ser: 0.89 mg/dL (ref 0.40–1.20)
GFR: 61.93 mL/min (ref >60.00)
Glucose, Bld: 104 mg/dL — ABNORMAL HIGH (ref 70–99)
Potassium: 4.1 mEq/L (ref 3.5–5.1)
Sodium: 134 mEq/L — ABNORMAL LOW (ref 135–145)

## 2019-07-05 LAB — TSH: TSH: 2.85 u[IU]/mL (ref 0.35–4.50)

## 2019-07-05 NOTE — Patient Instructions (Signed)
We are scheduling your bronchoscopy for 24 February at 1 PM  Stop Eliquis after your LAST dose on February 19  Follow-up will be 2 to 3 weeks after the procedure, we will call you sooner with the biopsy results

## 2019-07-05 NOTE — Telephone Encounter (Signed)
Phone pre admit visit has been scheduled for 07/12/2019 between 8-1p with covid test on 07/15/2019. ATC pt- unable to leave vm due to mailbox not being setup.

## 2019-07-05 NOTE — Telephone Encounter (Addendum)
Pt has been scheduled for bornch with navi on 07/17/2019 at 1:00 Dx: RUL lung mass CPT: 60737  Suanne Marker, please see bronch info. Thanks

## 2019-07-05 NOTE — Progress Notes (Signed)
Subjective:    Patient ID: Tamara Whitehead, female    DOB: June 18, 1945, 74 y.o.   MRN: [id number]  HPI Patient is a 74 year old current smoker (0.5 PPD) who is seen at the multidisciplinary cancer clinic for evaluation of a right upper lobe masslike opacity.  The patient notes that she had pneumonia in mid November and was hospitalized for approximately 5 days during that time.  Her chest x-ray did not clear during her admission.  She was treated with IV antibiotics and was sent home on p.o. antibiotics to complete a 10-day course.  Subsequently saw her primary care physician Dr. [name] who repeated a chest x-ray showed persistence of an opacity on the right upper lobe.  It was recommended that she continue close observation and CT scan was performed on 9 February which showed persistent masslike density on the right upper lobe.  She has now been referred to the multidisciplinary clinic for evaluation of this issue and to schedule biopsy.  Patient has had no respiratory symptoms since her November admission.  She does not endorse any cough or hemoptysis.  No dyspnea, no wheezing no orthopnea or paroxysmal nocturnal dyspnea.  Of note, during her hospitalization she did develop atrial flutter was initially placed on diltiazem but then developed a rash on this medication.  She was since switched to metoprolol.  She does note that prior to her November admission she had started to lose some weight due to lack of appetite that she attributes to stressors in her life.  She states that since her admission in November she has actually gained weight back.  She estimated that she lost a total of 14 pounds which she is regaining.  She has not had any fevers, chills or sweats.  Orthopnea or paroxysmal nocturnal dyspnea.  No recurrent palpitations.  No lower extremity edema nor chest pain.  Past medical history is remarkable for rheumatoid arthritis she is on Humira injections.  As noted she has had issues with  atrial flutter but has been controlled metoprolol, issues also with hypertension on metoprolol and Hyzaar.  Surgical history reviewed and as documented.  Allergies noted to codeine (nausea) and diltiazem (hives).  Social history is remarkable for cigarette smoking she has smoked at least a half a pack of cigarettes per day for approximately 30 years perhaps longer.  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  She lives with her son who has also been struggling with cancer issues.  Review of Systems A 10 point review of systems was performed and it is as noted above otherwise negative.    Objective:   Physical Exam BP (!) 157/83 (BP Location: Left Arm, Patient Position: Sitting)   Pulse 60  GENERAL: Thin well-developed woman, fully ambulatory, no respiratory distress HEAD: Normocephalic, atraumatic.  EYES: Pupils equal, round, reactive to light.  No scleral icterus.  MOUTH: Nose/mouth/throat not examined due to masking requirements for COVID 19. NECK: Supple. No thyromegaly. No nodules. No JVD.  Trachea midline, no crepitus. PULMONARY: Lungs clear to auscultation bilaterally.  No adventitious sounds CARDIOVASCULAR: S1 and S2. Regular rate and rhythm.  No murmurs appreciated. GASTROINTESTINAL: Flat, nondistended, soft. MUSCULOSKELETAL: No joint swelling, no clubbing, no edema.  She does have Heberden's nodes both hands. NEUROLOGIC: Awake alert, no overt focal deficits.  Fluent speech. SKIN: Intact,warm,dry.  Limited examination shows no rashes. PSYCH: Mood and behavior appropriate    Representative slice from CT scan of the chest  performed February 9.  Films reviewed independently and also shown to patient.      Assessment & Plan:  Masslike density on the right upper lobe  Could still represent slow to resolve pneumonia versus lung cancer  Patient is high risk for lung cancer  To have PET/CT 18 February  Final  determination of appropriate procedure on that day when films reviewed  Schedule for bronchoscopy 24 February 1 PM  Preliminary procedure is navigational bronchoscopy  Eliquis to be held for 5 days prior to procedure  Benefits, limitations and potential complications of the procedure were discussed with the patient/family  including, but not limited to bleeding, hemoptysis, respiratory failure requiring intubation and/or prolongued mechanical ventilation, infection, pneumothorax (collapse of lung) requiring chest tube placement, stroke from air embolism or even death  Patient understands procedure to be done under general anesthesia  Patient agrees to proceed with the same   Tobacco dependence due to cigarettes Smoking cessation counseling 3 to 5 minutes   Factors that may affect general anesthesia History of atrial flutter: Currently controlled on beta-blocker Essential hypertension: Controlled with Hyzaar and metoprolol Chronic anticoagulation, patient to stop for 5 days prior to procedure   Follow-up will be 2 to 3 weeks after the procedure.  We will notify the patient prior to that with regards to the results of biopsy.  Renold Don, MD [facility]   *This note was dictated using voice recognition software/Dragon.  Despite best efforts to proofread, errors can occur which can change the meaning.  Any change was purely unintentional.

## 2019-07-05 NOTE — H&P (View-Only) (Signed)
Subjective:    Patient ID: Tamara Whitehead, female    DOB: 11/13/1945, 74 y.o.   MRN: [id number]  HPI Patient is a 74 year old current smoker (0.5 PPD) who is seen at the multidisciplinary cancer clinic for evaluation of a right upper lobe masslike opacity.  The patient notes that she had pneumonia in mid November and was hospitalized for approximately 5 days during that time.  Her chest x-ray did not clear during her admission.  She was treated with IV antibiotics and was sent home on p.o. antibiotics to complete a 10-day course.  Subsequently saw her primary care physician Dr. [name] who repeated a chest x-ray showed persistence of an opacity on the right upper lobe.  It was recommended that she continue close observation and CT scan was performed on 9 February which showed persistent masslike density on the right upper lobe.  She has now been referred to the multidisciplinary clinic for evaluation of this issue and to schedule biopsy.  Patient has had no respiratory symptoms since her November admission.  She does not endorse any cough or hemoptysis.  No dyspnea, no wheezing no orthopnea or paroxysmal nocturnal dyspnea.  Of note, during her hospitalization she did develop atrial flutter was initially placed on diltiazem but then developed a rash on this medication.  She was since switched to metoprolol.  She does note that prior to her November admission she had started to lose some weight due to lack of appetite that she attributes to stressors in her life.  She states that since her admission in November she has actually gained weight back.  She estimated that she lost a total of 14 pounds which she is regaining.  She has not had any fevers, chills or sweats.  Orthopnea or paroxysmal nocturnal dyspnea.  No recurrent palpitations.  No lower extremity edema nor chest pain.  Past medical history is remarkable for rheumatoid arthritis she is on Humira injections.  As noted she has had issues with  atrial flutter but has been controlled metoprolol, issues also with hypertension on metoprolol and Hyzaar.  Surgical history reviewed and as documented.  Allergies noted to codeine (nausea) and diltiazem (hives).  Social history is remarkable for cigarette smoking she has smoked at least a half a pack of cigarettes per day for approximately 30 years perhaps longer.  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  She lives with her son who has also been struggling with cancer issues.  Review of Systems A 10 point review of systems was performed and it is as noted above otherwise negative.    Objective:   Physical Exam BP (!) 157/83 (BP Location: Left Arm, Patient Position: Sitting)   Pulse 60  GENERAL: Thin well-developed woman, fully ambulatory, no respiratory distress HEAD: Normocephalic, atraumatic.  EYES: Pupils equal, round, reactive to light.  No scleral icterus.  MOUTH: Nose/mouth/throat not examined due to masking requirements for COVID 19. NECK: Supple. No thyromegaly. No nodules. No JVD.  Trachea midline, no crepitus. PULMONARY: Lungs clear to auscultation bilaterally.  No adventitious sounds CARDIOVASCULAR: S1 and S2. Regular rate and rhythm.  No murmurs appreciated. GASTROINTESTINAL: Flat, nondistended, soft. MUSCULOSKELETAL: No joint swelling, no clubbing, no edema.  She does have Heberden's nodes both hands. NEUROLOGIC: Awake alert, no overt focal deficits.  Fluent speech. SKIN: Intact,warm,dry.  Limited examination shows no rashes. PSYCH: Mood and behavior appropriate    Representative slice from CT scan of the chest  performed February 9.  Films reviewed independently and also shown to patient.      Assessment & Plan:  Masslike density on the right upper lobe  Could still represent slow to resolve pneumonia versus lung cancer  Patient is high risk for lung cancer  To have PET/CT 18 February  Final  determination of appropriate procedure on that day when films reviewed  Schedule for bronchoscopy 24 February 1 PM  Preliminary procedure is navigational bronchoscopy  Eliquis to be held for 5 days prior to procedure  Benefits, limitations and potential complications of the procedure were discussed with the patient/family  including, but not limited to bleeding, hemoptysis, respiratory failure requiring intubation and/or prolongued mechanical ventilation, infection, pneumothorax (collapse of lung) requiring chest tube placement, stroke from air embolism or even death  Patient understands procedure to be done under general anesthesia  Patient agrees to proceed with the same   Tobacco dependence due to cigarettes Smoking cessation counseling 3 to 5 minutes   Factors that may affect general anesthesia History of atrial flutter: Currently controlled on beta-blocker Essential hypertension: Controlled with Hyzaar and metoprolol Chronic anticoagulation, patient to stop for 5 days prior to procedure   Follow-up will be 2 to 3 weeks after the procedure.  We will notify the patient prior to that with regards to the results of biopsy.  Renold Don, MD [facility]   *This note was dictated using voice recognition software/Dragon.  Despite best efforts to proofread, errors can occur which can change the meaning.  Any change was purely unintentional.

## 2019-07-05 NOTE — Assessment & Plan Note (Signed)
Continue Synthroid.  Check TSH. 

## 2019-07-05 NOTE — Assessment & Plan Note (Signed)
Sinus rhythm today.  She will continue with her Eliquis.  I discussed pulmonology's recommendations for her to hold Eliquis for 5 days prior to her biopsy.  Discussed that she would need to do this to have the biopsy.  Discussed that coming off of the Eliquis imparted risk of stroke and that if she developed any stroke symptoms she needs to be evaluated in the emergency room.

## 2019-07-05 NOTE — Assessment & Plan Note (Signed)
Well-controlled in the office today.  I think her blood pressure cuff is inaccurate as the reading we got was 130/70 with CMA check and then 166/80 with the patient's home BP monitor.  She will try changing the batteries.  She will continue with her current regimen.  Check BMP.

## 2019-07-05 NOTE — Progress Notes (Signed)
Tommi Rumps, MD Phone: [phone]  Tamara Whitehead is a 74 y.o. female who presents today for f/u.  HYPERTENSION  Disease Monitoring  Home BP Monitoring has been in the 170s/80s at home Chest pain- no    Dyspnea- no Medications  Compliance-  Taking losartan, hctz, metoprolol.   Edema- no  HYPOTHYROIDISM Disease Monitoring Weight changes: stable   Skin Changes: no Heat/Cold intolerance: some cold intoelrance  Medication Monitoring Compliance:  Taking synthroid   Last TSH:   Lab Results  Component Value Date   TSH 1.853 04/07/2019   Lung mass: Recent diagnosis.  She saw oncology and pulmonology earlier today.  This is concerning for lung cancer.  They have her scheduled for a biopsy and she is also going to have a PET scan and MRI.  She has a history of smoking.  A. fib: She has been taking the Eliquis recently with a coupon card.  She is working with our clinical pharmacist on getting this covered.  She has had a little bit of bruising though no bleeding with this medication.  Social History   Tobacco Use  Smoking Status Current Every Day Smoker  . Packs/day: 0.50  . Years: 30.00  . Pack years: 15.00  . Types: Cigarettes  Smokeless Tobacco Never Used     ROS see history of present illness  Objective  Physical Exam Vitals:   07/05/19 1114 07/05/19 1147  BP: 130/70   Pulse: (!) 47 67  Temp: (!) 96.3 F (35.7 C)   SpO2: 99%     BP Readings from Last 3 Encounters:  07/05/19 130/70  07/05/19 (!) 157/83  07/05/19 (!) 157/83   Wt Readings from Last 3 Encounters:  07/05/19 102 lb (46.3 kg)  07/05/19 106 lb 4.8 oz (48.2 kg)  06/20/19 105 lb 3.2 oz (47.7 kg)    Physical Exam Constitutional:      General: She is not in acute distress.    Appearance: She is not diaphoretic.  Cardiovascular:     Rate and Rhythm: Normal rate and regular rhythm.     Heart sounds: Normal heart sounds.  Pulmonary:     Effort: Pulmonary effort is normal.     Breath sounds:  Normal breath sounds.  Musculoskeletal:     Right lower leg: No edema.     Left lower leg: No edema.     Comments: Small bruise on the dorsum of her right hand  Skin:    General: Skin is warm and dry.  Neurological:     Mental Status: She is alert.      Assessment/Plan: Please see individual problem list.  Atrial flutter (Gas) Sinus rhythm today.  She will continue with her Eliquis.  I discussed pulmonology's recommendations for her to hold Eliquis for 5 days prior to her biopsy.  Discussed that she would need to do this to have the biopsy.  Discussed that coming off of the Eliquis imparted risk of stroke and that if she developed any stroke symptoms she needs to be evaluated in the emergency room.  Hypertension Well-controlled in the office today.  I think her blood pressure cuff is inaccurate as the reading we got was 130/70 with CMA check and then 166/80 with the patient's home BP monitor.  She will try changing the batteries.  She will continue with her current regimen.  Check BMP.  Adult hypothyroidism Continue Synthroid.  Check TSH.  Orders Placed This Encounter  Procedures  . Basic Metabolic Panel (BMET)  .  TSH    No orders of the defined types were placed in this encounter.   This visit occurred during the SARS-CoV-2 public health emergency.  Safety protocols were in place, including screening questions prior to the visit, additional usage of staff PPE, and extensive cleaning of exam room while observing appropriate contact time as indicated for disinfecting solutions.    Tommi Rumps, MD [place]

## 2019-07-05 NOTE — Telephone Encounter (Signed)
Pt is aware of below appointment dates/times and voiced her understanding.

## 2019-07-05 NOTE — Patient Instructions (Signed)
Nice to see you. Please try changing the batteries in your blood pressure monitor. We will check lab work and contact you with results. If you develop any stroke symptoms such as numbness, weakness, slurred speech, vision changes, or facial droop when you are off of your Eliquis you need to call 911 and go to the emergency room.

## 2019-07-06 NOTE — Progress Notes (Signed)
[place]  [phone] [phone]  ID: Tamara Whitehead OB: April 09, 1946  MR#: [id number]  BJS#:[id number]  Patient Care Team: Leone Haven, MD as PCP - General (Family Medicine) De Hollingshead, [facility] (Pharmacist) Telford Nab, RN as Registered Nurse  CHIEF COMPLAINT: Right upper lobe lung mass.  INTERVAL HISTORY: Patient is a 74 year old female who was admitted to the hospital several months ago with pneumonia.  She had a follow-up chest x-ray and subsequent CT scan which revealed a right upper lobe lung mass highly suspicious for underlying malignancy.  She is anxious, but otherwise feels well.  She has no neurologic complaints.  She denies any recent fevers.  She has a good appetite and denies weight loss.  She has no chest pain, shortness of breath, cough, or hemoptysis.  She denies any nausea, vomiting, constipation, or diarrhea.  She has no urinary complaints.  Patient otherwise feels well and offers no further specific complaints today.  REVIEW OF SYSTEMS:   Review of Systems  Constitutional: Negative.  Negative for fever, malaise/fatigue and weight loss.  Respiratory: Negative.  Negative for cough, hemoptysis and shortness of breath.   Cardiovascular: Negative.  Negative for chest pain and leg swelling.  Gastrointestinal: Negative.  Negative for abdominal pain.  Genitourinary: Negative.  Negative for dysuria.  Musculoskeletal: Negative.  Negative for back pain.  Skin: Negative.  Negative for rash.  Neurological: Negative.  Negative for dizziness, focal weakness, weakness and headaches.  Psychiatric/Behavioral: The patient is nervous/anxious.     As per HPI. Otherwise, a complete review of systems is negative.  PAST MEDICAL HISTORY: Past Medical History:  Diagnosis Date  . Arthritis    Osteoarthritis/Rheumatoid  . Atrial flutter ([place])   . Cancer (HCC)    Skin  . Cataract   . Community acquired pneumonia 04/04/2019  .  Fibrocystic breast disease   . Glaucoma   . Hypercholesterolemia   . Hypertension   . Rheumatoid arthritis ([place])   . Thyroid disease    Hypothyroid    PAST SURGICAL HISTORY: Past Surgical History:  Procedure Laterality Date  . ABDOMINAL HYSTERECTOMY     Total abd. hysterectomu and bilateral salping-oophorectomy  . EYE SURGERY Right    Glaucoma surgery  . HEMORRHOID SURGERY    . status post dilatation and curettage  1975  . TONSILLECTOMY    . TUBAL LIGATION      FAMILY HISTORY: Family History  Problem Relation Age of Onset  . CAD Mother   . Arthritis Mother        Rheumatoid Arthritis  . Heart failure Mother   . Arthritis Father        Rheumatoid/Osteoarthritis and Blindness, bleeding Tendencies,Glaucoma  . CAD Father   . Heart failure Father   . Hypertension Maternal Grandmother   . COPD Neg Hx     ADVANCED DIRECTIVES (Y/N):  N  HEALTH MAINTENANCE: Social History   Tobacco Use  . Smoking status: Current Every Day Smoker    Packs/day: 0.50    Years: 30.00    Pack years: 15.00    Types: Cigarettes  . Smokeless tobacco: Never Used  Substance Use Topics  . Alcohol use: Yes    Comment: wine occasional  . Drug use: No     Colonoscopy:  PAP:  Bone density:  Lipid panel:  Allergies  Allergen Reactions  . Codeine Nausea And Vomiting    Can take cough syrups  . Diltiazem     Rash  Current Outpatient Medications  Medication Sig Dispense Refill  . Adalimumab 40 MG/0.8ML PNKT Inject 40 mg into the skin every 14 (fourteen) days.    Marland Kitchen apixaban (ELIQUIS) 5 MG TABS tablet Take 1 tablet (5 mg total) by mouth 2 (two) times daily. 60 tablet 3  . Calcium Carbonate (CALCIUM 600 PO) Take 600 mg by mouth daily.    . Coenzyme Q-10 100 MG capsule Take 100 mg by mouth daily.    Marland Kitchen levothyroxine (SYNTHROID) 50 MCG tablet Take 1 tablet (50 mcg total) by mouth daily before breakfast. 90 tablet 1  . losartan-hydrochlorothiazide (HYZAAR) 50-12.5 MG tablet Take 0.5  tablets by mouth daily. In am 45 tablet 0  . metoprolol tartrate (LOPRESSOR) 25 MG tablet Take 1 tablet (25 mg total) by mouth 2 (two) times daily. 60 tablet 2  . Multiple Vitamin (MULTIVITAMIN) tablet Take 1 tablet by mouth daily.    . naproxen sodium (ALEVE) 220 MG tablet Take 220 mg by mouth 2 (two) times daily with a meal.     No current facility-administered medications for this visit.    OBJECTIVE: Vitals:   07/05/19 0902  BP: (!) 157/83  Pulse: 60  Temp: 97.6 F (36.4 C)     Body mass index is 20.09 kg/m.    ECOG FS:0 - Asymptomatic  General: Well-developed, well-nourished, no acute distress. Eyes: Pink conjunctiva, anicteric sclera. HEENT: Normocephalic, moist mucous membranes. Lungs: No audible wheezing or coughing. Heart: Regular rate and rhythm. Abdomen: Soft, nontender, no obvious distention. Musculoskeletal: No edema, cyanosis, or clubbing. Neuro: Alert, answering all questions appropriately. Cranial nerves grossly intact. Skin: No rashes or petechiae noted. Psych: Normal affect. Lymphatics: No cervical, calvicular, axillary or inguinal LAD.   LAB RESULTS:  Lab Results  Component Value Date   NA 134 (L) 07/05/2019   K 4.1 07/05/2019   CL 98 07/05/2019   CO2 30 07/05/2019   GLUCOSE 104 (H) 07/05/2019   BUN 15 07/05/2019   CREATININE 0.89 07/05/2019   CALCIUM 10.3 07/05/2019   PROT 8.0 05/13/2019   ALBUMIN 4.1 05/13/2019   AST 32 05/13/2019   ALT 32 05/13/2019   ALKPHOS 89 05/13/2019   BILITOT 0.6 05/13/2019   GFRNONAA >60 05/13/2019   GFRAA >60 05/13/2019    Lab Results  Component Value Date   WBC 11.5 (H) 05/13/2019   NEUTROABS 6.2 04/08/2019   HGB 14.0 05/13/2019   HCT 42.8 05/13/2019   MCV 95.3 05/13/2019   PLT 214 05/13/2019     STUDIES: CT Chest Wo Contrast  Result Date: 07/03/2019 CLINICAL DATA:  Abnormal chest x-ray in December EXAM: CT CHEST WITHOUT CONTRAST TECHNIQUE: Multidetector CT imaging of the chest was performed following  the standard protocol without IV contrast. COMPARISON:  Radiograph 05/13/2019, MRI 03/29/2017, liver Doppler 04/07/2019. FINDINGS: Cardiovascular: Normal cardiac size. Mild pericardial thickening/trace pericardial effusion. Fluid attenuation cystic structure near the cardiac apex may reflect a small pericardial cyst given T2 signal and nonenhancement on comparison abdominal MRI. Coronary artery calcifications are present. Atherosclerotic calcifications also present throughout the normal caliber thoracic aorta. Normally branching great vessels are moderately calcified as well. Mediastinum/Nodes: No mediastinal fluid, gas hemorrhage. There is a enlarged precarinal lymph node measuring up to 11 mm in size (2/62). No other enlarged mediastinal or axillary nodes. Hilar adenopathy difficult to assess in the absence of contrast media. No acute abnormality of the trachea or esophagus. Thyroid gland and thoracic inlet are unremarkable. Lungs/Pleura: There is a large masslike opacity in the right upper  lung which appears to extend from the airways to the pleural surface which measures maximally 4.1 x 2.7 x 3.8 cm in size (2/52, 5/51). There is abrupt truncation of several airways which inter into the level of this mass. Furthermore, there is an airway of either airway dilatation versus central cavitation more towards the periphery. Margins of the mass demonstrate extensive spiculation and reticular change with possible extension towards the minor fissure. There is volume loss with retraction of the minor fissure. No other suspicious nodules or masses are seen. Bandlike opacities in the lung bases likely reflect atelectasis or scarring. Background of centrilobular and paraseptal emphysema. Upper Abdomen: No acute abnormalities present in the visualized portions of the upper abdomen. Hypoattenuating lesion in the posterior left lobe liver is unchanged from prior ultrasound and MRI. Musculoskeletal: No acute osseous abnormality  or suspicious osseous lesion. IMPRESSION: 1. Persistent masslike opacity in the right upper lung measuring 4.1 x 2.7 x 3.8 cm. Persistence of this finding and imaging features on today's exam are highly worrisome for possible bronchogenic carcinoma. Furthermore there is an enlarged precarinal lymph node measuring up to 11 mm in size concerning for metastatic disease. Recommend referral to multi disciplinary clinic for further evaluation and workup. 2. Coronary artery and aortic atherosclerosis. 3. Hypoattenuating lesion in the posterior left lobe of the liver is unchanged from prior ultrasound and MRI. 4. Trace pericardial thickening versus effusion. Small pericardial cyst. 5. Aortic Atherosclerosis (ICD10-I70.0). These results will be called to the ordering clinician or representative by the Radiologist Assistant, and communication documented in the PACS or zVision Dashboard. Electronically Signed   By: Lovena Le M.D.   On: 07/03/2019 04:01    ASSESSMENT: Right upper lobe lung mass.  PLAN:    1. Right upper lobe lung mass: Highly suspicious for underlying malignancy.  Patient was also evaluated by pulmonology today who plans to do navigational bronchoscopy to obtain biopsy in the near future.  Patient has a PET scan scheduled for July 11, 2019.  Will order MRI of the brain to complete the staging work-up.  Patient will return to clinic 1 week after biopsy to discuss the results and treatment planning. 2.  Anxiety: Patient reports that her unemployment soon run out and she is concerned about her financial status.  She also lives with her son who is recovering from cancer treatments.  Referral has been made to social work.  I spent a total of 60 minutes reviewing chart data, face-to-face evaluation with the patient, counseling and coordination of care as detailed above.   Patient expressed understanding and was in agreement with this plan. She also understands that She can call clinic at any time  with any questions, concerns, or complaints.   Cancer Staging No matching staging information was found for the patient.  Lloyd Huger, MD   07/06/2019 7:46 AM

## 2019-07-08 ENCOUNTER — Telehealth: Payer: Self-pay | Admitting: Family Medicine

## 2019-07-08 NOTE — Telephone Encounter (Signed)
07/08/2019 at 10:35 am EST spoke with Ronalee Belts at [facility]. Procedure code [phone] is a V/B code which does not require PA.  Call Ref # [id number]. Rhonda J Cobb

## 2019-07-08 NOTE — Telephone Encounter (Signed)
Unable to lvm to set up 1 month f/u

## 2019-07-10 ENCOUNTER — Institutional Professional Consult (permissible substitution): Payer: Medicare HMO | Admitting: Pulmonary Disease

## 2019-07-11 ENCOUNTER — Ambulatory Visit: Payer: Medicare HMO

## 2019-07-12 ENCOUNTER — Other Ambulatory Visit: Payer: Self-pay

## 2019-07-12 ENCOUNTER — Encounter
Admission: RE | Admit: 2019-07-12 | Discharge: 2019-07-12 | Disposition: A | Discharge status: Home / Self Care | Payer: Medicare HMO | Source: Ambulatory Visit | Attending: Pulmonary Disease | Admitting: Pulmonary Disease

## 2019-07-12 HISTORY — DX: Anxiety disorder, unspecified: F41.9

## 2019-07-12 HISTORY — DX: Hypothyroidism, unspecified: E03.9

## 2019-07-12 NOTE — Patient Instructions (Signed)
Your procedure is scheduled on: Wednesday 07-17-19 Report to Day Surgery on the 2nd floor of the [facility]. To find out your arrival time, please call [phone] between 1PM - 3PM on: Tuesday 07-16-2019  REMEMBER: Instructions that are not followed completely may result in serious medical risk, up to and including death; or upon the discretion of your surgeon and anesthesiologist your surgery may need to be rescheduled.  Do not eat food after midnight the night before surgery.  No gum chewing, lozengers or hard candies.  You may however, drink CLEAR liquids up to 2 hours before you are scheduled to arrive for your surgery. Do not drink anything within 2 hours of the start of your surgery.  Clear liquids include: - water  - apple juice without pulp - CLEAR gatorade - black coffee or tea (Do NOT add milk or creamers to the coffee or tea) Do NOT drink anything that is not on this list.  Type 1 and Type 2 diabetics should only drink water.  No Alcohol for 24 hours before or after surgery.  No Smoking including e-cigarettes for 24 hours prior to surgery.  No chewable tobacco products for at least 6 hours prior to surgery.  No nicotine patches on the day of surgery.  On the morning of surgery brush your teeth with toothpaste and water, you may rinse your mouth with mouthwash if you wish. Do not swallow any toothpaste or mouthwash.  Notify your doctor if there is any change in your medical condition (cold, fever, infection).  Do not wear jewelry, make-up, hairpins, clips or nail polish.  Do not wear lotions, powders, or perfumes.   Do not shave 48 hours prior to surgery.   Contacts and dentures may not be worn into surgery.  Do not bring valuables to the hospital, including drivers license, insurance or credit cards.  [facility] is not responsible for any belongings or valuables.   TAKE THESE MEDICATIONS THE MORNING OF SURGERY: LEVOTHYROXINE METOPROLOL  TAKE A  SHOWER  Follow recommendations from Cardiologist, Pulmonologist or PCP regarding stopping  Eliquis, . LAST DOSE  07-12-2019 PER MD  Stop Anti-inflammatories (NSAIDS) such as ASPIRIN  Advil, Aleve, Ibuprofen, Motrin, Naproxen, Naprosyn and Aspirin based products such as Excedrin, Goodys Powder, BC Powder.  (May take Tylenol or Acetaminophen if needed.)  Stop ANY OVER THE COUNTER supplements until after surgery. STOP COQ10 (May continue Vitamin D, Vitamin B, and multivitamin.)  Wear comfortable clothing (specific to your surgery type) to the hospital.  Plan for stool softeners for home use.  If you are being discharged the day of surgery, you will not be allowed to drive home. You will need a responsible adult to drive you home and stay with you that night.   If you are taking public transportation, you will need to have a responsible adult with you. Please confirm with your physician that it is acceptable to use public transportation.   Please call [phone] if you have any questions about these instructions.

## 2019-07-15 ENCOUNTER — Other Ambulatory Visit
Admission: RE | Admit: 2019-07-15 | Discharge: 2019-07-15 | Disposition: A | Discharge status: Home / Self Care | Payer: Medicare HMO | Source: Ambulatory Visit | Attending: Pulmonary Disease | Admitting: Pulmonary Disease

## 2019-07-15 ENCOUNTER — Other Ambulatory Visit: Payer: Self-pay

## 2019-07-15 ENCOUNTER — Ambulatory Visit
Admission: RE | Admit: 2019-07-15 | Discharge: 2019-07-15 | Disposition: A | Discharge status: Home / Self Care | Payer: Medicare HMO | Source: Ambulatory Visit | Attending: Oncology | Admitting: Oncology

## 2019-07-15 DIAGNOSIS — R918 Other nonspecific abnormal finding of lung field: Secondary | ICD-10-CM | POA: Insufficient documentation

## 2019-07-15 DIAGNOSIS — I2510 Atherosclerotic heart disease of native coronary artery without angina pectoris: Secondary | ICD-10-CM | POA: Insufficient documentation

## 2019-07-15 DIAGNOSIS — I700 Atherosclerosis of aorta: Secondary | ICD-10-CM | POA: Insufficient documentation

## 2019-07-15 DIAGNOSIS — Z20822 Contact with and (suspected) exposure to covid-19: Secondary | ICD-10-CM | POA: Insufficient documentation

## 2019-07-15 LAB — SARS CORONAVIRUS 2 (TAT 6-24 HRS): SARS Coronavirus 2: NEGATIVE

## 2019-07-15 LAB — GLUCOSE, CAPILLARY: Glucose-Capillary: 98 mg/dL (ref 70–99)

## 2019-07-15 MED ORDER — FLUDEOXYGLUCOSE F - 18 (FDG) INJECTION
5.3000 | Freq: Once | INTRAVENOUS | Status: AC | PRN
  Administered 2019-07-15: 13:00:00 5.78 via INTRAVENOUS

## 2019-07-16 ENCOUNTER — Ambulatory Visit
Admission: RE | Admit: 2019-07-16 | Discharge: 2019-07-16 | Disposition: A | Discharge status: Home / Self Care | Payer: Medicare HMO | Source: Ambulatory Visit | Attending: Oncology | Admitting: Oncology

## 2019-07-16 DIAGNOSIS — R918 Other nonspecific abnormal finding of lung field: Secondary | ICD-10-CM | POA: Insufficient documentation

## 2019-07-16 DIAGNOSIS — C3490 Malignant neoplasm of unspecified part of unspecified bronchus or lung: Secondary | ICD-10-CM | POA: Diagnosis not present

## 2019-07-16 MED ORDER — GADOBUTROL 1 MMOL/ML IV SOLN
4.0000 mL | Freq: Once | INTRAVENOUS | Status: AC | PRN
  Administered 2019-07-16: 19:00:00 4 mL via INTRAVENOUS

## 2019-07-17 ENCOUNTER — Ambulatory Visit: Payer: Medicare HMO | Admitting: Certified Registered Nurse Anesthetist

## 2019-07-17 ENCOUNTER — Ambulatory Visit: Payer: Medicare HMO

## 2019-07-17 ENCOUNTER — Encounter
Admission: RE | Disposition: A | Discharge status: Home / Self Care | Payer: Self-pay | Source: Home / Self Care | Attending: Pulmonary Disease

## 2019-07-17 ENCOUNTER — Other Ambulatory Visit: Payer: Self-pay

## 2019-07-17 ENCOUNTER — Ambulatory Visit
Admission: RE | Admit: 2019-07-17 | Discharge: 2019-07-17 | Disposition: A | Discharge status: Home / Self Care | Payer: Medicare HMO | Attending: Pulmonary Disease | Admitting: Pulmonary Disease

## 2019-07-17 DIAGNOSIS — J9383 Other pneumothorax: Secondary | ICD-10-CM

## 2019-07-17 DIAGNOSIS — F419 Anxiety disorder, unspecified: Secondary | ICD-10-CM | POA: Insufficient documentation

## 2019-07-17 DIAGNOSIS — R918 Other nonspecific abnormal finding of lung field: Secondary | ICD-10-CM | POA: Diagnosis not present

## 2019-07-17 DIAGNOSIS — Z888 Allergy status to other drugs, medicaments and biological substances status: Secondary | ICD-10-CM | POA: Insufficient documentation

## 2019-07-17 DIAGNOSIS — I4892 Unspecified atrial flutter: Secondary | ICD-10-CM | POA: Insufficient documentation

## 2019-07-17 DIAGNOSIS — E7800 Pure hypercholesterolemia, unspecified: Secondary | ICD-10-CM | POA: Diagnosis not present

## 2019-07-17 DIAGNOSIS — I10 Essential (primary) hypertension: Secondary | ICD-10-CM | POA: Diagnosis not present

## 2019-07-17 DIAGNOSIS — F17210 Nicotine dependence, cigarettes, uncomplicated: Secondary | ICD-10-CM | POA: Diagnosis not present

## 2019-07-17 DIAGNOSIS — I4891 Unspecified atrial fibrillation: Secondary | ICD-10-CM | POA: Diagnosis not present

## 2019-07-17 DIAGNOSIS — E039 Hypothyroidism, unspecified: Secondary | ICD-10-CM | POA: Insufficient documentation

## 2019-07-17 DIAGNOSIS — J439 Emphysema, unspecified: Secondary | ICD-10-CM | POA: Diagnosis not present

## 2019-07-17 DIAGNOSIS — N6019 Diffuse cystic mastopathy of unspecified breast: Secondary | ICD-10-CM | POA: Diagnosis not present

## 2019-07-17 DIAGNOSIS — Z9889 Other specified postprocedural states: Secondary | ICD-10-CM

## 2019-07-17 DIAGNOSIS — M069 Rheumatoid arthritis, unspecified: Secondary | ICD-10-CM | POA: Diagnosis not present

## 2019-07-17 DIAGNOSIS — Z7901 Long term (current) use of anticoagulants: Secondary | ICD-10-CM | POA: Diagnosis not present

## 2019-07-17 DIAGNOSIS — J95811 Postprocedural pneumothorax: Secondary | ICD-10-CM

## 2019-07-17 DIAGNOSIS — Z885 Allergy status to narcotic agent status: Secondary | ICD-10-CM | POA: Insufficient documentation

## 2019-07-17 DIAGNOSIS — H409 Unspecified glaucoma: Secondary | ICD-10-CM | POA: Insufficient documentation

## 2019-07-17 DIAGNOSIS — J939 Pneumothorax, unspecified: Secondary | ICD-10-CM | POA: Diagnosis not present

## 2019-07-17 HISTORY — PX: VIDEO BRONCHOSCOPY WITH ENDOBRONCHIAL NAVIGATION: SHX6175

## 2019-07-17 SURGERY — VIDEO BRONCHOSCOPY WITH ENDOBRONCHIAL NAVIGATION
Anesthesia: General

## 2019-07-17 MED ORDER — ONDANSETRON HCL 4 MG/2ML IJ SOLN
INTRAMUSCULAR | Status: AC
  Filled 2019-07-17: qty 2

## 2019-07-17 MED ORDER — EPHEDRINE SULFATE 50 MG/ML IJ SOLN
INTRAMUSCULAR | Status: DC | PRN
  Administered 2019-07-17 (×4): 5 mg via INTRAVENOUS
  Administered 2019-07-17: 10 mg via INTRAVENOUS

## 2019-07-17 MED ORDER — ROCURONIUM BROMIDE 100 MG/10ML IV SOLN
INTRAVENOUS | Status: DC | PRN
  Administered 2019-07-17: 5 mg via INTRAVENOUS
  Administered 2019-07-17: 15 mg via INTRAVENOUS

## 2019-07-17 MED ORDER — BUTAMBEN-TETRACAINE-BENZOCAINE 2-2-14 % EX AERO
1.0000 | INHALATION_SPRAY | Freq: Once | CUTANEOUS | Status: DC
  Filled 2019-07-17: qty 5

## 2019-07-17 MED ORDER — PROPOFOL 10 MG/ML IV BOLUS
INTRAVENOUS | Status: DC | PRN
  Administered 2019-07-17: 80 mg via INTRAVENOUS

## 2019-07-17 MED ORDER — ROCURONIUM BROMIDE 50 MG/5ML IV SOLN
INTRAVENOUS | Status: AC
  Filled 2019-07-17: qty 1

## 2019-07-17 MED ORDER — DEXAMETHASONE SODIUM PHOSPHATE 10 MG/ML IJ SOLN
INTRAMUSCULAR | Status: AC
  Filled 2019-07-17: qty 1

## 2019-07-17 MED ORDER — FENTANYL CITRATE (PF) 100 MCG/2ML IJ SOLN
INTRAMUSCULAR | Status: AC
  Filled 2019-07-17: qty 2

## 2019-07-17 MED ORDER — ONDANSETRON HCL 4 MG/2ML IJ SOLN: 4.0000 mg | Freq: Once | INTRAMUSCULAR | Status: DC | PRN

## 2019-07-17 MED ORDER — SUGAMMADEX SODIUM 200 MG/2ML IV SOLN
INTRAVENOUS | Status: DC | PRN
  Administered 2019-07-17: 100 mg via INTRAVENOUS

## 2019-07-17 MED ORDER — FAMOTIDINE 20 MG PO TABS
ORAL_TABLET | ORAL | Status: AC
  Filled 2019-07-17: qty 1

## 2019-07-17 MED ORDER — LIDOCAINE HCL (PF) 2 % IJ SOLN
INTRAMUSCULAR | Status: AC
  Filled 2019-07-17: qty 10

## 2019-07-17 MED ORDER — SUCCINYLCHOLINE CHLORIDE 20 MG/ML IJ SOLN
INTRAMUSCULAR | Status: DC | PRN
  Administered 2019-07-17: 60 mg via INTRAVENOUS

## 2019-07-17 MED ORDER — EPHEDRINE SULFATE 50 MG/ML IJ SOLN
INTRAMUSCULAR | Status: AC
  Filled 2019-07-17: qty 1

## 2019-07-17 MED ORDER — FENTANYL CITRATE (PF) 100 MCG/2ML IJ SOLN
INTRAMUSCULAR | Status: DC | PRN
  Administered 2019-07-17 (×2): 50 ug via INTRAVENOUS

## 2019-07-17 MED ORDER — ONDANSETRON HCL 4 MG/2ML IJ SOLN
INTRAMUSCULAR | Status: DC | PRN
  Administered 2019-07-17: 4 mg via INTRAVENOUS

## 2019-07-17 MED ORDER — FAMOTIDINE 20 MG PO TABS
20.0000 mg | ORAL_TABLET | Freq: Once | ORAL | Status: AC
  Administered 2019-07-17: 13:00:00 20 mg via ORAL

## 2019-07-17 MED ORDER — FENTANYL CITRATE (PF) 100 MCG/2ML IJ SOLN: 25.0000 ug | INTRAMUSCULAR | Status: DC | PRN

## 2019-07-17 MED ORDER — LIDOCAINE HCL (CARDIAC) PF 100 MG/5ML IV SOSY
PREFILLED_SYRINGE | INTRAVENOUS | Status: DC | PRN
  Administered 2019-07-17: 60 mg via INTRAVENOUS

## 2019-07-17 MED ORDER — SUGAMMADEX SODIUM 200 MG/2ML IV SOLN
INTRAVENOUS | Status: AC
  Filled 2019-07-17: qty 2

## 2019-07-17 MED ORDER — LACTATED RINGERS IV SOLN: INTRAVENOUS | Status: DC

## 2019-07-17 MED ORDER — SEVOFLURANE IN SOLN
RESPIRATORY_TRACT | Status: AC
  Filled 2019-07-17: qty 250

## 2019-07-17 MED ORDER — PROPOFOL 10 MG/ML IV BOLUS
INTRAVENOUS | Status: AC
  Filled 2019-07-17: qty 20

## 2019-07-17 MED ORDER — DEXAMETHASONE SODIUM PHOSPHATE 10 MG/ML IJ SOLN
INTRAMUSCULAR | Status: DC | PRN
  Administered 2019-07-17: 10 mg via INTRAVENOUS

## 2019-07-17 NOTE — Anesthesia Procedure Notes (Signed)
Procedure Name: Intubation Date/Time: 07/17/2019 1:01 PM Performed by: Caryl Asp, CRNA Pre-anesthesia Checklist: Patient identified, Patient being monitored, Timeout performed, Emergency Drugs available and Suction available Patient Re-evaluated:Patient Re-evaluated prior to induction Oxygen Delivery Method: Circle system utilized Preoxygenation: Pre-oxygenation with 100% oxygen Induction Type: IV induction Ventilation: Mask ventilation without difficulty Laryngoscope Size: 3 and McGraph Grade View: Grade I Tube type: Oral Tube size: 8.5 mm Number of attempts: 1 Airway Equipment and Method: Stylet Placement Confirmation: ETT inserted through vocal cords under direct vision,  positive ETCO2 and breath sounds checked- equal and bilateral Secured at: 22 cm Tube secured with: Tape Dental Injury: Teeth and Oropharynx as per pre-operative assessment

## 2019-07-17 NOTE — Transfer of Care (Signed)
Immediate Anesthesia Transfer of Care Note  Patient: Tamara Whitehead  Procedure(s) Performed: VIDEO BRONCHOSCOPY WITH ENDOBRONCHIAL NAVIGATION (N/A )  Patient Location: PACU  Anesthesia Type:General  Level of Consciousness: awake, alert  and oriented  Airway & Oxygen Therapy: Patient Spontanous Breathing and Patient connected to face mask oxygen  Post-op Assessment: Report given to RN and Post -op Vital signs reviewed and stable  Post vital signs: Reviewed and stable  Last Vitals:  Vitals Value Taken Time  BP 170/64 07/17/19 1429  Temp    Pulse 64 07/17/19 1430  Resp 18 07/17/19 1430  SpO2 100 % 07/17/19 1430  Vitals shown include unvalidated device data.  Last Pain:  Vitals:   07/17/19 1227  TempSrc: Oral  PainSc: 0-No pain         Complications: No apparent anesthesia complications

## 2019-07-17 NOTE — Anesthesia Preprocedure Evaluation (Signed)
Anesthesia Evaluation  Patient identified by MRN, date of birth, ID band Patient awake    Reviewed: Allergy & Precautions, NPO status , Patient's Chart, lab work & pertinent test results  History of Anesthesia Complications Negative for: history of anesthetic complications  Airway Mallampati: II  TM Distance: >3 FB Neck ROM: Full    Dental  (+) Poor Dentition, Missing   Pulmonary neg sleep apnea, pneumonia, resolved, Current Smoker and Patient abstained from smoking.,  RUL mass    breath sounds clear to auscultation- rhonchi (-) wheezing      Cardiovascular hypertension, Pt. on medications (-) CAD, (-) Past MI, (-) Cardiac Stents and (-) CABG + dysrhythmias Atrial Fibrillation  Rhythm:Regular Rate:Normal - Systolic murmurs and - Diastolic murmurs    Neuro/Psych neg Seizures negative neurological ROS     GI/Hepatic negative GI ROS, Neg liver ROS,   Endo/Other  neg diabetesHypothyroidism   Renal/GU negative Renal ROS     Musculoskeletal  (+) Arthritis ,   Abdominal (+) - obese,   Peds  Hematology negative hematology ROS (+)   Anesthesia Other Findings Past Medical History: No date: Anxiety No date: Arthritis     Comment:  Osteoarthritis/Rheumatoid No date: Atrial flutter (HCC) No date: Cancer [facility])     Comment:  Skin No date: Cataract 04/04/2019: Community acquired pneumonia No date: Fibrocystic breast disease No date: Glaucoma No date: Hypercholesterolemia No date: Hypertension No date: Hypothyroidism No date: Rheumatoid arthritis ([place]) No date: Thyroid disease     Comment:  Hypothyroid   Reproductive/Obstetrics                             Anesthesia Physical Anesthesia Plan  ASA: III  Anesthesia Plan: General   Post-op Pain Management:    Induction: Intravenous  PONV Risk Score and Plan: 1 and Ondansetron and Dexamethasone  Airway Management Planned: Oral  ETT  Additional Equipment:   Intra-op Plan:   Post-operative Plan: Extubation in OR  Informed Consent: I have reviewed the patients History and Physical, chart, labs and discussed the procedure including the risks, benefits and alternatives for the proposed anesthesia with the patient or authorized representative who has indicated his/her understanding and acceptance.     Dental advisory given  Plan Discussed with: CRNA and Anesthesiologist  Anesthesia Plan Comments:         Anesthesia Quick Evaluation

## 2019-07-17 NOTE — Anesthesia Postprocedure Evaluation (Signed)
Anesthesia Post Note  Patient: Tamara Whitehead  Procedure(s) Performed: VIDEO BRONCHOSCOPY WITH ENDOBRONCHIAL NAVIGATION (N/A )  Patient location during evaluation: PACU Anesthesia Type: General Level of consciousness: awake and alert and oriented Pain management: pain level controlled Vital Signs Assessment: post-procedure vital signs reviewed and stable Respiratory status: spontaneous breathing, nonlabored ventilation and respiratory function stable Cardiovascular status: blood pressure returned to baseline and stable Postop Assessment: no signs of nausea or vomiting Anesthetic complications: no     Last Vitals:  Vitals:   07/17/19 1458 07/17/19 1513  BP: (!) 167/67 (!) 150/93  Pulse: (!) 58 (!) 55  Resp: 14 16  Temp:    SpO2: 100% 100%    Last Pain:  Vitals:   07/17/19 1513  TempSrc:   PainSc: 0-No pain                 Perrin Gens

## 2019-07-17 NOTE — Op Note (Signed)
Electromagnetic Navigation Bronchoscopy Cellvizio Endo microscopy  Indication: Right upper lobe lung mass, FDG avid, rule out cancer  Preoperative Diagnosis: Right upper lobe mass Post Procedure Diagnosis: Same as above Consent: Verbal/Written  Benefits, limitations and potential complications of the procedure were discussed with the patient/family  including, but not limited to bleeding, hemoptysis, respiratory failure requiring intubation and/or prolongued mechanical ventilation, infection, pneumothorax (collapse of lung) requiring chest tube placement, stroke from air embolism or even death.  The patient/family understand the risks and benefits and have agreed to proceed with ENB.  Operator: Renold Don, MD Assistant/scrub: Liborio Nixon, RRT Circulator: Annia Belt, RRT Anesthesiologist/CRRT: Emmie Niemann MD/Christina Eliberto Ivory, CRNA ROSE available: Yes, [facility]  Hand washing performed prior to starting the procedure.   Type of Anesthesia: General endotracheal anesthesia  Procedure Performed:  Virtual Bronchoscopy with Multi-planar Image analysis, 3-D reconstruction of coronal, sagittal and multi-planar images for the purposes of planning real-time bronchoscopy using the iLogic Electromagnetic Navigation Bronchoscopy System (superDimension).  Description of Procedure: Patient was taken to Procedure Room 2 (Bronchoscopy Suite) where appropriate timeout was taken with the staff.  Patient was inducted under general anesthesia by Dr. Randa Lynn and Frederico Hamman, CRNA.  She was intubated with an 8.5 ET tube without difficulty.  A Portex adapter was placed through the existing ET tube.  Once the patient was intubated, the bronchoscope was advanced through the Portex adapter into the existing ET tube.  The airway was then inspected.  Visible portions of the trachea were normal.  Carina was sharp.  The right tracheobronchial tree showed no endobronchial lesions.  The bronchoscope was then  advanced to the left and again no endobronchial lesions were noted.  The entire airway did have copious secretions noted these were lavaged till clear. At this point the super dimension extended working channel and locatable guide (LG) were introduced as a unit into the working channel of the bronchoscope. The LG was directed to standard registration points at the following centers: main carina, right upper lobe bronchus, right lower lobe bronchus, right middle lobe bronchus, left upper lobe bronchus, and the left lower lobe bronchus. This data was transferred to the i-Logic ENB system for real-time bronchoscopy. The scope was then navigated to the RIGHT upper lobe for tissue sampling.  The target was acquired the position was corroborated with fluoroscopy and with Cellvizio Endo microscopy.  The images with Cellvizio were noted to show extensive inflammation.  Redirecting the probe abnormal tissue could be seen in this area was chosen for biopsy.  First passes were performed with transbronchial brushings with fluoroscopic guidance.  ROSE showed a background of severe inflammation, the extended working channel was redirected again with the locatable guide to a different area of the lesion.  Brushings were performed again showing inflammation under ROSE.  At this point biopsies were obtained with precise or forceps and super dimension forceps.  These were done with fluoroscopic guidance.  Again ROSE showed extensive inflammation on the biopsy specimens.  The extended working channel and locatable guide were used again to ensure that the extended  working channel was still within the lesion this was corroborated with the locatable guide and with Cellvizio and the microscopy.  At this point additional biopsies were taken with fluoroscopic guidance.  In addition 3 passes were performed with the GEN cut biopsy tool.  Brushings were done 1 more time and a final sampling was done with bronchoalveolar lavage a total of 40  mL was instilled yielding 12 mL of aliquot.  Once this was concluded the extended working channel was removed and the airway was inspected.  Blood loss was nil.  Patient received a total of 12 mL of lidocaine via bronchial lavage and the bronchoscope was then removed and the procedure terminated.  The patient was allowed to emerge from general anesthesia and was extubated in the Procedure Room without difficulty.  She was taken to the PACU in satisfactory condition.    Specimens Obtained:  Transbronchial GEN cut needle Aspirations  times: 4  Transbronchial Forceps Biopsy times: 14  Transbronchial  Brush: X3  BAL: Aliquot 12 mL   Fluoroscopy:  Fluoroscopy was utilized during the course of this procedure to assure that biopsies were taken in a safe manner under fluoroscopic guidance with spot films required.   Complications: Postprocedure chest x-ray shows a small pneumothorax with small amount of subcutaneous air Estimated Blood Loss: minimal approx 2 mL     Assessment 1.  Right upper lobe mass 2.  Extensive inflammation 3.  Small iatrogenic pneumothorax  Plan/Additional Comments: 1.  Repeat chest x-ray at 1600 hrs. 2.  May need admission for observation 3.  Follow-up on pathology reports     C. Derrill Kay, MD [facility]    *This note was dictated using voice recognition software/Dragon.  Despite best efforts to proofread, errors can occur which can change the meaning.  Any change was purely unintentional.

## 2019-07-17 NOTE — Interval H&P Note (Signed)
History and Physical Interval Note:  07/17/2019 12:13 PM  Tamara Whitehead  has presented today for surgery, with the diagnosis of RIGHT UPPER LOBE LUNG MASS.  The various methods of treatment have been discussed with the patient and family. After consideration of risks, benefits and other options for treatment, the patient has consented to  Procedure(s): Melfa (N/A) as a surgical intervention.  The patient's history has been reviewed, patient examined, no change in status, stable for surgery.  I have reviewed the patient's chart and labs.  Questions were answered to the patient's satisfaction.     Vernard Gambles

## 2019-07-17 NOTE — Discharge Instructions (Signed)

## 2019-07-17 NOTE — Evaluation (Addendum)
Postprocedure chest x-ray showed small less than 5% pneumothorax with some lateral wall subcu emphysema.  Follow-up film at 1600 hrs. showed improvement on the lateral wall subcu emphysema and tiny imperceptible pneumothorax.  Patient is stable.  Oxygen saturation on room air is 100%.  Chest sounds are symmetrical.  I reviewed films with the patient and her son.  Patient will be staying with her son tonight.  Advised the patient to present to the emergency room if she develops any chest pain or shortness of breath.  Patient and son indicated understanding.  In the meantime, we will follow-up with chest x-ray in the morning and I will see her as a work in in my clinic for reevaluation in the morning.  She is discharged in satisfactory condition.  Also notified on-call physician Dr. Mortimer Fries of the above.  Tamara Don, MD [facility]   *This note was dictated using voice recognition software/Dragon.  Despite best efforts to proofread, errors can occur which can change the meaning.  Any change was purely unintentional.

## 2019-07-18 ENCOUNTER — Ambulatory Visit (INDEPENDENT_AMBULATORY_CARE_PROVIDER_SITE_OTHER): Payer: Medicare HMO | Admitting: Pulmonary Disease

## 2019-07-18 ENCOUNTER — Ambulatory Visit
Admission: RE | Admit: 2019-07-18 | Discharge: 2019-07-18 | Disposition: A | Discharge status: Home / Self Care | Payer: Medicare HMO | Source: Ambulatory Visit | Attending: Pulmonary Disease | Admitting: Pulmonary Disease

## 2019-07-18 DIAGNOSIS — J9383 Other pneumothorax: Secondary | ICD-10-CM | POA: Insufficient documentation

## 2019-07-18 DIAGNOSIS — J939 Pneumothorax, unspecified: Secondary | ICD-10-CM | POA: Diagnosis not present

## 2019-07-18 DIAGNOSIS — D849 Immunodeficiency, unspecified: Secondary | ICD-10-CM

## 2019-07-18 DIAGNOSIS — E440 Moderate protein-calorie malnutrition: Secondary | ICD-10-CM

## 2019-07-18 DIAGNOSIS — J95811 Postprocedural pneumothorax: Secondary | ICD-10-CM

## 2019-07-18 LAB — CYTOLOGY - NON PAP

## 2019-07-18 NOTE — Progress Notes (Signed)
Patient presents for follow-up from small iatrogenic pneumothorax.  This x-ray shows no residual pneumothorax.  Some residual subcu air which is resolving.  Patient has no complaint today no respiratory distress and no pain.  Initial biopsy results show inflammatory changes.  This was discussed with the patient.  Final results pending.  Patient has upcoming follow-up appointment.  She will restart Eliquis tonight.  No charge for today's visit.  Renold Don, MD  PCCM

## 2019-07-19 ENCOUNTER — Telehealth: Payer: Self-pay

## 2019-07-19 ENCOUNTER — Ambulatory Visit: Payer: Medicare HMO | Admitting: Family Medicine

## 2019-07-19 LAB — SURGICAL PATHOLOGY

## 2019-07-19 NOTE — Telephone Encounter (Signed)
07/19/2019 Spoke with patient about applying for Medicare Extra Help.  Patient recently had a procedure and is not up to talking requested I call her back Mon 3/1 after 10am.  Ambrose Mantle [phone]

## 2019-07-20 NOTE — Progress Notes (Signed)
[facility]  [phone] [phone]  ID: TELSA DILLAVOU OB: 14-Mar-1946  MR#: [id number]  YDX#:[id number]  Patient Care Team: Leone Haven, MD as PCP - General (Family Medicine) De Hollingshead, [facility] (Pharmacist) Telford Nab, RN as Oncology Nurse Navigator  CHIEF COMPLAINT: Right upper lobe lung mass.  INTERVAL HISTORY: Patient returns to clinic today for further evaluation and to discuss her bronchoscopy and final pathology results.  She currently feels well and is asymptomatic.  She has no neurologic complaints.  She denies any recent fevers or illnesses.  She has a good appetite and denies weight loss.  She has no chest pain, shortness of breath, cough, or hemoptysis.  She denies any nausea, vomiting, constipation, or diarrhea.  She has no urinary complaints.  Patient offers no specific complaints today.  REVIEW OF SYSTEMS:   Review of Systems  Constitutional: Negative.  Negative for fever, malaise/fatigue and weight loss.  Respiratory: Negative.  Negative for cough, hemoptysis and shortness of breath.   Cardiovascular: Negative.  Negative for chest pain and leg swelling.  Gastrointestinal: Negative.  Negative for abdominal pain.  Genitourinary: Negative.  Negative for dysuria.  Musculoskeletal: Negative.  Negative for back pain.  Skin: Negative.  Negative for rash.  Neurological: Negative.  Negative for dizziness, focal weakness, weakness and headaches.  Psychiatric/Behavioral: Negative.  The patient is not nervous/anxious.     As per HPI. Otherwise, a complete review of systems is negative.  PAST MEDICAL HISTORY: Past Medical History:  Diagnosis Date  . Anxiety   . Arthritis    Osteoarthritis/Rheumatoid  . Atrial flutter ([place])   . Cancer (HCC)    Skin  . Cataract   . Community acquired pneumonia 04/04/2019  . Fibrocystic breast disease   . Glaucoma   . Hypercholesterolemia   . Hypertension   . Hypothyroidism   .  Rheumatoid arthritis ([place])   . Thyroid disease    Hypothyroid    PAST SURGICAL HISTORY: Past Surgical History:  Procedure Laterality Date  . ABDOMINAL HYSTERECTOMY     Total abd. hysterectomu and bilateral salping-oophorectomy  . EYE SURGERY Right    Glaucoma surgery and cateract  . HEMORRHOID SURGERY    . status post dilatation and curettage  1975  . TONSILLECTOMY    . TONSILLECTOMY    . TUBAL LIGATION    . VIDEO BRONCHOSCOPY WITH ENDOBRONCHIAL NAVIGATION N/A 07/17/2019   Procedure: VIDEO BRONCHOSCOPY WITH ENDOBRONCHIAL NAVIGATION;  Surgeon: Tyler Pita, MD;  Location: [facility] ORS;  Service: Pulmonary;  Laterality: N/A;    FAMILY HISTORY: Family History  Problem Relation Age of Onset  . CAD Mother   . Arthritis Mother        Rheumatoid Arthritis  . Heart failure Mother   . Arthritis Father        Rheumatoid/Osteoarthritis and Blindness, bleeding Tendencies,Glaucoma  . CAD Father   . Heart failure Father   . Hypertension Maternal Grandmother   . COPD Neg Hx     ADVANCED DIRECTIVES (Y/N):  N  HEALTH MAINTENANCE: Social History   Tobacco Use  . Smoking status: Current Every Day Smoker    Packs/day: 0.50    Years: 30.00    Pack years: 15.00    Types: Cigarettes  . Smokeless tobacco: Never Used  Substance Use Topics  . Alcohol use: Yes    Comment: rarely  . Drug use: No     Colonoscopy:  PAP:  Bone density:  Lipid panel:  Allergies  Allergen  Reactions  . Codeine Nausea And Vomiting    Can take cough syrups  . Diltiazem Rash         Current Outpatient Medications  Medication Sig Dispense Refill  . acetaminophen (TYLENOL) 500 MG tablet Take 1,000 mg by mouth every 6 (six) hours as needed for moderate pain.    . Adalimumab 40 MG/0.8ML PNKT Inject 40 mg into the skin every 14 (fourteen) days.    . Calcium Carbonate (CALCIUM 600 PO) Take 600 mg by mouth every other day.     . Coenzyme Q-10 100 MG capsule Take 100 mg by mouth daily.    .  diphenhydrAMINE (BENADRYL) 25 MG tablet Take 25 mg by mouth at bedtime.    Marland Kitchen levothyroxine (SYNTHROID) 50 MCG tablet Take 1 tablet (50 mcg total) by mouth daily before breakfast. 90 tablet 1  . losartan-hydrochlorothiazide (HYZAAR) 50-12.5 MG tablet Take 0.5 tablets by mouth daily. In am 45 tablet 0  . metoprolol tartrate (LOPRESSOR) 25 MG tablet Take 1 tablet (25 mg total) by mouth 2 (two) times daily. 60 tablet 2  . Multiple Vitamin (MULTIVITAMIN) tablet Take 1 tablet by mouth daily.    . predniSONE (DELTASONE) 20 MG tablet Take 1 tablet (20 mg total) by mouth daily with breakfast. 30 tablet 0   No current facility-administered medications for this visit.    OBJECTIVE: Vitals:   07/23/19 1032  BP: (!) 141/75  Pulse: (!) 57  Resp: 18  Temp: (!) 96.1 F (35.6 C)  SpO2: 100%     Body mass index is 20.14 kg/m.    ECOG FS:0 - Asymptomatic  General: Well-developed, well-nourished, no acute distress. Eyes: Pink conjunctiva, anicteric sclera. HEENT: Normocephalic, moist mucous membranes. Lungs: No audible wheezing or coughing. Heart: Regular rate and rhythm. Abdomen: Soft, nontender, no obvious distention. Musculoskeletal: No edema, cyanosis, or clubbing. Neuro: Alert, answering all questions appropriately. Cranial nerves grossly intact. Skin: No rashes or petechiae noted. Psych: Normal affect.  LAB RESULTS:  Lab Results  Component Value Date   NA 134 (L) 07/05/2019   K 4.1 07/05/2019   CL 98 07/05/2019   CO2 30 07/05/2019   GLUCOSE 104 (H) 07/05/2019   BUN 15 07/05/2019   CREATININE 0.89 07/05/2019   CALCIUM 10.3 07/05/2019   PROT 8.0 05/13/2019   ALBUMIN 4.1 05/13/2019   AST 32 05/13/2019   ALT 32 05/13/2019   ALKPHOS 89 05/13/2019   BILITOT 0.6 05/13/2019   GFRNONAA >60 05/13/2019   GFRAA >60 05/13/2019    Lab Results  Component Value Date   WBC 11.5 (H) 05/13/2019   NEUTROABS 6.2 04/08/2019   HGB 14.0 05/13/2019   HCT 42.8 05/13/2019   MCV 95.3 05/13/2019    PLT 214 05/13/2019     STUDIES: DG Chest 2 View  Result Date: 07/18/2019 CLINICAL DATA:  Lung biopsy yesterday.  Follow-up pneumothorax. EXAM: CHEST - 2 VIEW COMPARISON:  07/17/2019. FINDINGS: No pneumothorax. Subcutaneous emphysema over the anterolateral right chest wall and at the right neck base is similar to the most recent prior exam. The opacity in the right upper lobe reflecting the pleural based mass is stable. Remainder of the lungs is clear. No pleural effusion. IMPRESSION: 1. No pneumothorax. 2. No change in the right-sided subcutaneous emphysema. 3. No new lung abnormalities. Electronically Signed   By: Lajean Manes M.D.   On: 07/18/2019 15:00   CT Chest Wo Contrast  Result Date: 07/03/2019 CLINICAL DATA:  Abnormal chest x-ray in December EXAM: CT CHEST  WITHOUT CONTRAST TECHNIQUE: Multidetector CT imaging of the chest was performed following the standard protocol without IV contrast. COMPARISON:  Radiograph 05/13/2019, MRI 03/29/2017, liver Doppler 04/07/2019. FINDINGS: Cardiovascular: Normal cardiac size. Mild pericardial thickening/trace pericardial effusion. Fluid attenuation cystic structure near the cardiac apex may reflect a small pericardial cyst given T2 signal and nonenhancement on comparison abdominal MRI. Coronary artery calcifications are present. Atherosclerotic calcifications also present throughout the normal caliber thoracic aorta. Normally branching great vessels are moderately calcified as well. Mediastinum/Nodes: No mediastinal fluid, gas hemorrhage. There is a enlarged precarinal lymph node measuring up to 11 mm in size (2/62). No other enlarged mediastinal or axillary nodes. Hilar adenopathy difficult to assess in the absence of contrast media. No acute abnormality of the trachea or esophagus. Thyroid gland and thoracic inlet are unremarkable. Lungs/Pleura: There is a large masslike opacity in the right upper lung which appears to extend from the airways to the pleural  surface which measures maximally 4.1 x 2.7 x 3.8 cm in size (2/52, 5/51). There is abrupt truncation of several airways which inter into the level of this mass. Furthermore, there is an airway of either airway dilatation versus central cavitation more towards the periphery. Margins of the mass demonstrate extensive spiculation and reticular change with possible extension towards the minor fissure. There is volume loss with retraction of the minor fissure. No other suspicious nodules or masses are seen. Bandlike opacities in the lung bases likely reflect atelectasis or scarring. Background of centrilobular and paraseptal emphysema. Upper Abdomen: No acute abnormalities present in the visualized portions of the upper abdomen. Hypoattenuating lesion in the posterior left lobe liver is unchanged from prior ultrasound and MRI. Musculoskeletal: No acute osseous abnormality or suspicious osseous lesion. IMPRESSION: 1. Persistent masslike opacity in the right upper lung measuring 4.1 x 2.7 x 3.8 cm. Persistence of this finding and imaging features on today's exam are highly worrisome for possible bronchogenic carcinoma. Furthermore there is an enlarged precarinal lymph node measuring up to 11 mm in size concerning for metastatic disease. Recommend referral to multi disciplinary clinic for further evaluation and workup. 2. Coronary artery and aortic atherosclerosis. 3. Hypoattenuating lesion in the posterior left lobe of the liver is unchanged from prior ultrasound and MRI. 4. Trace pericardial thickening versus effusion. Small pericardial cyst. 5. Aortic Atherosclerosis (ICD10-I70.0). These results will be called to the ordering clinician or representative by the Radiologist Assistant, and communication documented in the PACS or zVision Dashboard. Electronically Signed   By: Lovena Le M.D.   On: 07/03/2019 04:01   MR Brain W Wo Contrast  Result Date: 07/17/2019 CLINICAL DATA:  Lung cancer. Rule out metastases. EXAM:  MRI HEAD WITHOUT AND WITH CONTRAST TECHNIQUE: Multiplanar, multiecho pulse sequences of the brain and surrounding structures were obtained without and with intravenous contrast. CONTRAST:  49mL GADAVIST GADOBUTROL 1 MMOL/ML IV SOLN COMPARISON:  None. FINDINGS: Brain: No acute infarction, hemorrhage, hydrocephalus, extra-axial collection or mass lesion. No focal abnormal contrast enhancement seen to suggest metastatic disease to the brain. Scattered foci of T2 hyperintensity without contrast enhancement or restricted diffusion are seen within the white matter of the cerebral hemispheres and within the right dentate nucleus, nonspecific, most likely related to chronic small vessel ischemia. Vascular: Normal flow voids. Skull and upper cervical spine: Normal marrow signal. Sinuses/Orbits: Fluid level is noted in the left maxillary sinus. Correlate clinically for acute sinusitis. Right lens surgery. Other: None. IMPRESSION: 1. No evidence of intracranial metastatic disease. 2. Scattered foci of T2 hyperintensity without  restricted diffusion or contrast enhancement within the white matter of the cerebral hemispheres and right dentate nucleus, nonspecific, most likely related to chronic small vessel ischemia. 3. Fluid level in the left maxillary sinus. Correlate clinically for acute sinusitis. Electronically Signed   By: Pedro Earls M.D.   On: 07/17/2019 10:59   NM PET Image Initial (PI) Skull Base To Thigh  Result Date: 07/15/2019 CLINICAL DATA:  Initial treatment strategy for lung mass. EXAM: NUCLEAR MEDICINE PET SKULL BASE TO THIGH TECHNIQUE: 5.78 mCi F-18 FDG was injected intravenously. Full-ring PET imaging was performed from the skull base to thigh after the radiotracer. CT data was obtained and used for attenuation correction and anatomic localization. Fasting blood glucose: 98 mg/dl COMPARISON:  07/02/2019 FINDINGS: Mediastinal blood pool activity: SUV max 2.1 Liver activity: SUV max NA NECK:  No hypermetabolic lymph nodes in the neck. Incidental CT findings: none CHEST: No FDG avid mediastinal or hilar lymph nodes. No FDG avid axillary or supraclavicular lymph nodes. The right upper lobe lung mass measures 5.1 x 2.5 cm and has an SUV max of 10.8, image 86/3. Also in the right upper lobe, but separate from the dominant mass, is a focal area of FDG avid pleural thickening measuring 1.6 x 0.5 cm, image 94/3. The corresponding SUV max is 2.59. This abuts the minor fissure and the pleura overlying the posterior lateral right upper lobe. No additional foci of abnormal increased uptake identified within the chest. Incidental CT findings: Aortic atherosclerosis. Lad and left circumflex coronary artery calcifications. Well-circumscribed fluid attenuating structure within the epicardial fat measures 2.3 x 1.5 cm, without corresponding increased FDG uptake. This is favored to represent a benign cyst. ABDOMEN/PELVIS: No abnormal hypermetabolic activity within the liver, pancreas, adrenal glands, or spleen. No hypermetabolic lymph nodes in the abdomen or pelvis. Incidental CT findings: Aortic atherosclerosis. Nonobstructing right renal calculi noted. Urinary bladder is unremarkable. SKELETON: No focal hypermetabolic activity to suggest skeletal metastasis. Incidental CT findings: none IMPRESSION: 1. Intense FDG uptake is associated with the mass within the right upper lobe with an SUV max of 10.8. 2. Also in the right upper lobe but separate from the dominant mass is a focal area of pleural thickening which abuts the minor fissure. A second site of disease within the right upper lobe cannot be excluded. 3. Coronary artery calcifications 4.  Aortic Atherosclerosis (ICD10-I70.0). Electronically Signed   By: Kerby Moors M.D.   On: 07/15/2019 16:47   DG Chest Port 1 View  Result Date: 07/17/2019 CLINICAL DATA:  Pneumothorax post biopsy, follow-up EXAM: PORTABLE CHEST 1 VIEW COMPARISON:  Portable exam 1557 hours  compared to 1441 hours FINDINGS: Normal heart size, mediastinal contours, and pulmonary vascularity. Emphysematous and bronchitic changes again identified with stable RIGHT upper lobe mass. Subsegmental atelectasis at LEFT base. Tiny RIGHT apex pneumothorax new since earlier exam. No acute infiltrate or pleural effusion. RIGHT lateral chest wall emphysema extending into axilla again identified. IMPRESSION: Tiny RIGHT apex pneumothorax. Electronically Signed   By: Lavonia Dana M.D.   On: 07/17/2019 17:00   DG Chest Port 1 View  Result Date: 07/17/2019 CLINICAL DATA:  Post video bronchoscopy EXAM: PORTABLE CHEST 1 VIEW COMPARISON:  Portable exam 1440 hours compared to 05/13/2019 FINDINGS: Normal heart size, mediastinal contours, and pulmonary vascularity. Atherosclerotic calcification aorta. Chronic RIGHT upper lobe opacity corresponding to known RIGHT upper lobe mass. No acute infiltrate, pleural effusion or pneumothorax. RIGHT chest wall emphysema extending into RIGHT axilla. Osseous structures unremarkable. IMPRESSION: RIGHT upper  lobe mass. RIGHT lateral chest wall emphysema extending into RIGHT axilla without definite pneumothorax. Electronically Signed   By: Lavonia Dana M.D.   On: 07/17/2019 16:55   DG C-Arm 1-60 Min-No Report  Result Date: 07/17/2019 Fluoroscopy was utilized by the requesting physician.  No radiographic interpretation.    ASSESSMENT: Right upper lobe lung mass.  PLAN:    1. Right upper lobe lung mass: Although this is still highly suspicious for underlying malignancy, bronchoscopy only revealed significant inflammation.  Rheumatoid lung is a possibility.  Case discussed at length with pulmonary and it was agreed upon to initiate 20 mg prednisone daily for 3 weeks until patient is evaluated by Dr. Patsey Berthold.  At that point, we will decide whether to continue or taper prednisone as well as have a discussion for a second navigational bronchoscopy.  Patient will return to the cancer  center in 4 weeks for further evaluation and additional diagnostic or treatment planning if needed.  Of note, MRI of the brain on July 16, 2019 did not reveal any metastatic disease. 2.  Anxiety: Previously, patient reported her unemployment will soon run out and she is concerned about her financial status.  She also lives with her son who is recovering from cancer treatments.  Referral has been made to social work.  I spent a total of 30 minutes reviewing chart data, face-to-face evaluation with the patient, counseling and coordination of care as detailed above.   Patient expressed understanding and was in agreement with this plan. She also understands that She can call clinic at any time with any questions, concerns, or complaints.   Cancer Staging No matching staging information was found for the patient.  Lloyd Huger, MD   07/23/2019 11:19 AM

## 2019-07-22 ENCOUNTER — Telehealth: Payer: Self-pay

## 2019-07-22 NOTE — Telephone Encounter (Signed)
07/22/2019 Unable to leave message, voicemail not set-up. Will call again this week regarding Medicare Extra Help. Ambrose Mantle [phone]

## 2019-07-23 ENCOUNTER — Telehealth: Payer: Self-pay

## 2019-07-23 ENCOUNTER — Other Ambulatory Visit: Payer: Self-pay

## 2019-07-23 ENCOUNTER — Encounter: Payer: Self-pay | Admitting: *Deleted

## 2019-07-23 ENCOUNTER — Inpatient Hospital Stay: Payer: Medicare HMO | Attending: Oncology | Admitting: Oncology

## 2019-07-23 VITALS — BP 141/75 | HR 57 | Temp 96.1°F | Resp 18 | Wt 106.6 lb

## 2019-07-23 DIAGNOSIS — E039 Hypothyroidism, unspecified: Secondary | ICD-10-CM | POA: Diagnosis not present

## 2019-07-23 DIAGNOSIS — R918 Other nonspecific abnormal finding of lung field: Secondary | ICD-10-CM | POA: Diagnosis not present

## 2019-07-23 DIAGNOSIS — J439 Emphysema, unspecified: Secondary | ICD-10-CM | POA: Diagnosis not present

## 2019-07-23 MED ORDER — PREDNISONE 20 MG PO TABS: 20.0000 mg | ORAL_TABLET | Freq: Every day | ORAL | 0 refills | Status: DC

## 2019-07-23 NOTE — Progress Notes (Signed)
Patient is here for follow up she is doing well no major complaints  

## 2019-07-23 NOTE — Progress Notes (Signed)
  Oncology Nurse Navigator Documentation  Navigator Location: [facility] (07/23/19 1300)   )Navigator Encounter Type: Follow-up Appt (07/23/19 1300)                     Patient Visit Type: MedOnc (07/23/19 1300)   Barriers/Navigation Needs: Coordination of Care (07/23/19 1300)   Interventions: Coordination of Care (07/23/19 1300)   Coordination of Care: Appts (07/23/19 1300)        Acuity: Level 2-Minimal Needs (1-2 Barriers Identified) (07/23/19 1300)    met with patient during follow up visit with Dr. Grayland Ormond to review final pathology results and discuss next steps. All questions answered during visit. Reviewed upcoming appts with patient. Contact info given and instructed to call with any further questions or needs. Pt verbalized understanding. Nothing further needed at this time. Will follow up at next clinic visit.     Time Spent with Patient: 30 (07/23/19 1300)

## 2019-07-23 NOTE — Telephone Encounter (Signed)
07/23/2019 Unable to leave message, voicemail not set-up. Will call again this week regarding Medicare Extra Help. Ambrose Mantle [phone]

## 2019-07-31 ENCOUNTER — Telehealth: Payer: Self-pay

## 2019-07-31 NOTE — Telephone Encounter (Signed)
07/31/2019 Spoke with patient about applying for [employer]. Per patient request she has had several doctor appointments and has not had time  to get all the paperwork needed.  She requested that I have Medicare mail the application to her.  Called Medicare patient should receive the application in the next 2 weeks. Will call patient in the next 2 weeks to make sure she has received the application. Ambrose Mantle [phone]

## 2019-08-05 ENCOUNTER — Ambulatory Visit (INDEPENDENT_AMBULATORY_CARE_PROVIDER_SITE_OTHER): Payer: Medicare HMO | Admitting: Pharmacist

## 2019-08-05 DIAGNOSIS — I4891 Unspecified atrial fibrillation: Secondary | ICD-10-CM

## 2019-08-05 DIAGNOSIS — R918 Other nonspecific abnormal finding of lung field: Secondary | ICD-10-CM

## 2019-08-05 DIAGNOSIS — I10 Essential (primary) hypertension: Secondary | ICD-10-CM

## 2019-08-05 NOTE — Patient Instructions (Signed)
Visit Information  [name], Tamara Whitehead is the receipt from the [facility] Extra Help application. Be on the lookout for an envelope from the [employer]. Let me know when you have received that.   Call with any other questions or concerns.   Thanks! Catie Darnelle Maffucci, PharmD [phone]         Goals Addressed            This Visit's Progress     Patient Stated   . "I need help with my medications" (pt-stated)       CARE PLAN ENTRY (see longtitudinal plan of care for additional care plan information)  Current Barriers:  . Polypharmacy; complex patient with multiple comorbidities including atrial fibrillation, HLD, HTN, RA . 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 Struggling financially to support her son, who is unable to work d/t cancer tx . Notes she has submitted multiple job applications, but has not been called back for any interviews.  . Has not submitted Medicare Extra Help application yet, is awaiting application in the mail from [place] . Self-manages medications.  o Afib/HTN: metoprolol 25 mg BID, losartan 50/12.5 mg, 1/2 tab QAM; 128/76 HR 61 yesterday morning; Eliquis 5 mg BID, notes she has continued to take this, and was able to "scrape together" funds to fill once free 30 day supply was completed o RA: Humira Q14 days; notes that she has run out of that, but needs to reenroll w/ Humira PAP through [employer]. Notes that it has historically been a quick process for her to call and re-enroll, so she plans to do that soon. o HLD: rosuvastatin held at discharge d/t elevated LFTs; repeat post discharge LFT were normal; ASCVD risk score 33%  o Hypothyroidism: levothyroxine 50 mcg daily o Lung mass: s/p bronchoscopy/biopsy by Dr. Patsey Berthold, also following w/ Dr. Grayland Ormond. Biopsy showed inflammation; current tx w/ prednisone, f/u with Dr. Patsey Berthold later this month for further work up  Pharmacist Clinical Goal(s):  Marland Kitchen Over the next 90  days, patient will work with PharmD and provider towards optimized medication management  Interventions: . Comprehensive medication review performed; medication list updated in electronic medical record . With patient's permission, collaboratively completed Medicare Extra Help online application together. Counseled that decision should come in 4-6 weeks in the mail from [facility]. Patient will be on the lookout for this. Eliquis will be affordable if this is approved. . Reviewed recent medical history. Empathetic listening as patient worried about being diagnosed with cancer, especially that she would not be able to work.  . Worried about unemployement ending soon, pending legislature decisions. Continue to work with [place] for community resources and support.  . Pending next steps of lung mass work up, consider restarting statin therapy given ASCVD risk 33%  Patient Self Care Activities:  . Patient will take medications as prescribed  Please see past updates related to this goal by clicking on the "Past Updates" button in the selected goal         The patient verbalized understanding of instructions provided today and agreed to receive a mailed copy of patient instruction and/or educational materials.  Plan:  - Scheduled f/u call 09/16/19  Catie Darnelle Maffucci, PharmD, BCACP, CPP Clinical Pharmacist [place] [phone]

## 2019-08-05 NOTE — Chronic Care Management (AMB) (Signed)
Chronic Care Management   Follow Up Note   08/05/2019 Name: Tamara Whitehead MRN: [id number] DOB: 19-Oct-1945  Referred by: Leone Haven, MD Reason for referral : Chronic Care Management (Medication Management)   Tamara Whitehead is a 74 y.o. year old female who is a primary care patient of [name], Angela Adam, MD. The CCM team was consulted for assistance with chronic disease management and care coordination needs.    Contacted patient for medication management review.  Review of patient status, including review of consultants reports, relevant laboratory and other test results, and collaboration with appropriate care team members and the patient's provider was performed as part of comprehensive patient evaluation and provision of chronic care management services.    SDOH (Social Determinants of Health) assessments performed: Yes See Care Plan activities for detailed interventions related to [facility])     Outpatient Encounter Medications as of 08/05/2019  Medication Sig  . ELIQUIS 5 MG TABS tablet Take 5 mg by mouth in the morning and at bedtime.  Marland Kitchen levothyroxine (SYNTHROID) 50 MCG tablet Take 1 tablet (50 mcg total) by mouth daily before breakfast.  . losartan-hydrochlorothiazide (HYZAAR) 50-12.5 MG tablet Take 0.5 tablets by mouth daily. In am  . metoprolol tartrate (LOPRESSOR) 25 MG tablet Take 1 tablet (25 mg total) by mouth 2 (two) times daily.  . predniSONE (DELTASONE) 20 MG tablet Take 1 tablet (20 mg total) by mouth daily with breakfast.  . acetaminophen (TYLENOL) 500 MG tablet Take 1,000 mg by mouth every 6 (six) hours as needed for moderate pain.  . Adalimumab 40 MG/0.8ML PNKT Inject 40 mg into the skin every 14 (fourteen) days.  . Calcium Carbonate (CALCIUM 600 PO) Take 600 mg by mouth every other day.   . Coenzyme Q-10 100 MG capsule Take 100 mg by mouth daily.  . diphenhydrAMINE (BENADRYL) 25 MG tablet Take 25 mg by mouth at bedtime.  . Multiple Vitamin (MULTIVITAMIN) tablet  Take 1 tablet by mouth daily.   No facility-administered encounter medications on file as of 08/05/2019.     Objective:   Goals Addressed            This Visit's Progress     Patient Stated   . "I need help with my medications" (pt-stated)       CARE PLAN ENTRY (see longtitudinal plan of care for additional care plan information)  Current Barriers:  . Polypharmacy; complex patient with multiple comorbidities including atrial fibrillation, HLD, HTN, RA . 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 Struggling financially to support her son, who is unable to work d/t cancer tx . Notes she has submitted multiple job applications, but has not been called back for any interviews.  . Has not submitted Medicare Extra Help application yet, is awaiting application in the mail from [place] . Self-manages medications.  o Afib/HTN: metoprolol 25 mg BID, losartan 50/12.5 mg, 1/2 tab QAM; 128/76 HR 61 yesterday morning; Eliquis 5 mg BID, notes she has continued to take this, and was able to "scrape together" funds to fill once free 30 day supply was completed o RA: Humira Q14 days; notes that she has run out of that, but needs to reenroll w/ Humira PAP through [employer]. Notes that it has historically been a quick process for her to call and re-enroll, so she plans to do that soon. o HLD: rosuvastatin held at discharge d/t elevated LFTs; repeat post discharge LFT were normal; ASCVD risk score 33%  o Hypothyroidism: levothyroxine 50 mcg daily o Lung mass: s/p bronchoscopy/biopsy by Dr. Patsey Berthold, also following w/ Dr. Grayland Ormond. Biopsy showed inflammation; current tx w/ prednisone, f/u with Dr. Patsey Berthold later this month for further work up  Pharmacist Clinical Goal(s):  Marland Kitchen Over the next 90 days, patient will work with PharmD and provider towards optimized medication management  Interventions: . Comprehensive medication review performed; medication list updated in electronic medical  record . With patient's permission, collaboratively completed Medicare Extra Help online application together. Counseled that decision should come in 4-6 weeks in the mail from [facility]. Patient will be on the lookout for this. Eliquis will be affordable if this is approved. . Reviewed recent medical history. Empathetic listening as patient worried about being diagnosed with cancer, especially that she would not be able to work.  . Worried about unemployement ending soon, pending legislature decisions. Continue to work with [facility] [facility] for community resources and support.  . Pending next steps of lung mass work up, consider restarting statin therapy given ASCVD risk 33%  Patient Self Care Activities:  . Patient will take medications as prescribed  Please see past updates related to this goal by clicking on the "Past Updates" button in the selected goal          Plan:  - Scheduled f/u call 09/16/19  Catie Darnelle Maffucci, PharmD, BCACP, [facility] Pharmacist [place] [phone]

## 2019-08-12 ENCOUNTER — Ambulatory Visit: Payer: Medicare HMO | Admitting: Pulmonary Disease

## 2019-08-17 NOTE — Progress Notes (Signed)
[place]  [phone] [phone]) [phone]  ID: LYNCOLN MASKELL OB: April 07, 1946  MR#: [id number]  OIN#:[id number]  Patient Care Team: Leone Haven, MD as PCP - General (Family Medicine) De Hollingshead, [facility] as Pharmacist (Pharmacist) Telford Nab, RN as Oncology Nurse Navigator Grayland Ormond, Kathlene November, MD as Consulting Physician (Oncology)  CHIEF COMPLAINT: Right upper lobe lung mass.  INTERVAL HISTORY: Patient returns to clinic today for further evaluation.  She continues to feel well and remains asymptomatic.  She reports approximately 2 days left on her extended prednisone taper. She has no neurologic complaints.  She denies any recent fevers or illnesses.  She has a good appetite and denies weight loss.  She has no chest pain, shortness of breath, cough, or hemoptysis.  She denies any nausea, vomiting, constipation, or diarrhea.  She has no urinary complaints.  Patient offers no specific complaints today.  REVIEW OF SYSTEMS:   Review of Systems  Constitutional: Negative.  Negative for fever, malaise/fatigue and weight loss.  Respiratory: Negative.  Negative for cough, hemoptysis and shortness of breath.   Cardiovascular: Negative.  Negative for chest pain and leg swelling.  Gastrointestinal: Negative.  Negative for abdominal pain.  Genitourinary: Negative.  Negative for dysuria.  Musculoskeletal: Negative.  Negative for back pain.  Skin: Negative.  Negative for rash.  Neurological: Negative.  Negative for dizziness, focal weakness, weakness and headaches.  Psychiatric/Behavioral: Negative.  The patient is not nervous/anxious.     As per HPI. Otherwise, a complete review of systems is negative.  PAST MEDICAL HISTORY: Past Medical History:  Diagnosis Date  . Anxiety   . Arthritis    Osteoarthritis/Rheumatoid  . Atrial flutter ([place])   . Cancer (HCC)    Skin  . Cataract   . Community acquired pneumonia 04/04/2019  . Fibrocystic breast  disease   . Glaucoma   . Hypercholesterolemia   . Hypertension   . Hypothyroidism   . Rheumatoid arthritis ([place])   . Thyroid disease    Hypothyroid    PAST SURGICAL HISTORY: Past Surgical History:  Procedure Laterality Date  . ABDOMINAL HYSTERECTOMY     Total abd. hysterectomu and bilateral salping-oophorectomy  . EYE SURGERY Right    Glaucoma surgery and cateract  . HEMORRHOID SURGERY    . status post dilatation and curettage  1975  . TONSILLECTOMY    . TONSILLECTOMY    . TUBAL LIGATION    . VIDEO BRONCHOSCOPY WITH ENDOBRONCHIAL NAVIGATION N/A 07/17/2019   Procedure: VIDEO BRONCHOSCOPY WITH ENDOBRONCHIAL NAVIGATION;  Surgeon: Tyler Pita, MD;  Location: [facility] ORS;  Service: Pulmonary;  Laterality: N/A;    FAMILY HISTORY: Family History  Problem Relation Age of Onset  . CAD Mother   . Arthritis Mother        Rheumatoid Arthritis  . Heart failure Mother   . Arthritis Father        Rheumatoid/Osteoarthritis and Blindness, bleeding Tendencies,Glaucoma  . CAD Father   . Heart failure Father   . Hypertension Maternal Grandmother   . COPD Neg Hx     ADVANCED DIRECTIVES (Y/N):  N  HEALTH MAINTENANCE: Social History   Tobacco Use  . Smoking status: Current Every Day Smoker    Packs/day: 0.50    Years: 30.00    Pack years: 15.00    Types: Cigarettes  . Smokeless tobacco: Never Used  Substance Use Topics  . Alcohol use: Yes    Comment: rarely  . Drug use: No  Colonoscopy:  PAP:  Bone density:  Lipid panel:  Allergies  Allergen Reactions  . Codeine Nausea And Vomiting    Can take cough syrups  . Diltiazem Rash         Current Outpatient Medications  Medication Sig Dispense Refill  . acetaminophen (TYLENOL) 500 MG tablet Take 1,000 mg by mouth every 6 (six) hours as needed for moderate pain.    . Adalimumab 40 MG/0.8ML PNKT Inject 40 mg into the skin every 14 (fourteen) days.    . Calcium Carbonate (CALCIUM 600 PO) Take 600 mg by mouth every  other day.     . Coenzyme Q-10 100 MG capsule Take 100 mg by mouth daily.    . diphenhydrAMINE (BENADRYL) 25 MG tablet Take 25 mg by mouth at bedtime.    Marland Kitchen ELIQUIS 5 MG TABS tablet Take 5 mg by mouth in the morning and at bedtime.    Marland Kitchen levothyroxine (SYNTHROID) 50 MCG tablet Take 1 tablet (50 mcg total) by mouth daily before breakfast. 90 tablet 1  . losartan-hydrochlorothiazide (HYZAAR) 50-12.5 MG tablet Take 0.5 tablets by mouth daily. In am 45 tablet 0  . metoprolol tartrate (LOPRESSOR) 25 MG tablet Take 1 tablet (25 mg total) by mouth 2 (two) times daily. 60 tablet 2  . Multiple Vitamin (MULTIVITAMIN) tablet Take 1 tablet by mouth daily.    . predniSONE (DELTASONE) 20 MG tablet Take 1 tablet (20 mg total) by mouth daily with breakfast. 30 tablet 0   No current facility-administered medications for this visit.    OBJECTIVE: Vitals:   08/20/19 1027  BP: (!) 146/61  Pulse: (!) 53  Resp: 20  Temp: (!) 96 F (35.6 C)  SpO2: 100%     Body mass index is 21.29 kg/m.    ECOG FS:0 - Asymptomatic  General: Thin, no acute distress. Eyes: Pink conjunctiva, anicteric sclera. HEENT: Normocephalic, moist mucous membranes. Lungs: No audible wheezing or coughing. Heart: Regular rate and rhythm. Abdomen: Soft, nontender, no obvious distention. Musculoskeletal: No edema, cyanosis, or clubbing. Neuro: Alert, answering all questions appropriately. Cranial nerves grossly intact. Skin: No rashes or petechiae noted. Psych: Normal affect.  LAB RESULTS:  Lab Results  Component Value Date   NA 134 (L) 07/05/2019   K 4.1 07/05/2019   CL 98 07/05/2019   CO2 30 07/05/2019   GLUCOSE 104 (H) 07/05/2019   BUN 15 07/05/2019   CREATININE 0.89 07/05/2019   CALCIUM 10.3 07/05/2019   PROT 8.0 05/13/2019   ALBUMIN 4.1 05/13/2019   AST 32 05/13/2019   ALT 32 05/13/2019   ALKPHOS 89 05/13/2019   BILITOT 0.6 05/13/2019   GFRNONAA >60 05/13/2019   GFRAA >60 05/13/2019    Lab Results  Component  Value Date   WBC 11.5 (H) 05/13/2019   NEUTROABS 6.2 04/08/2019   HGB 14.0 05/13/2019   HCT 42.8 05/13/2019   MCV 95.3 05/13/2019   PLT 214 05/13/2019     STUDIES: No results found.  ASSESSMENT: Right upper lobe lung mass.  PLAN:    1. Right upper lobe lung mass: Although this is still highly suspicious for underlying malignancy, bronchoscopy only revealed significant inflammation.  Rheumatoid lung is a possibility.  Case discussed at length with pulmonary and it was agreed upon to initiate 20 mg prednisone daily for 3 weeks.  Patient reports she has 2 days left.  We will get a CT scan on August 28, 2019 to assess for interval change.  Patient has follow-up with Dr. Patsey Berthold  the following day.  No follow-up has been scheduled in the cancer center at this time.  Will await CT results and Dr. Patsey Berthold evaluation prior to rescheduling patient.  Of note, MRI of the brain on July 16, 2019 did not reveal any metastatic disease.  I spent a total of 20 minutes reviewing chart data, face-to-face evaluation with the patient, counseling and coordination of care as detailed above.   Patient expressed understanding and was in agreement with this plan. She also understands that She can call clinic at any time with any questions, concerns, or complaints.   Cancer Staging No matching staging information was found for the patient.  Lloyd Huger, MD   08/20/2019 12:40 PM

## 2019-08-20 ENCOUNTER — Encounter: Payer: Self-pay | Admitting: Oncology

## 2019-08-20 ENCOUNTER — Inpatient Hospital Stay (HOSPITAL_BASED_OUTPATIENT_CLINIC_OR_DEPARTMENT_OTHER): Payer: Medicare HMO | Admitting: Oncology

## 2019-08-20 ENCOUNTER — Other Ambulatory Visit: Payer: Self-pay

## 2019-08-20 VITALS — BP 146/61 | HR 53 | Temp 96.0°F | Resp 20 | Wt 112.7 lb

## 2019-08-20 DIAGNOSIS — J439 Emphysema, unspecified: Secondary | ICD-10-CM | POA: Diagnosis not present

## 2019-08-20 DIAGNOSIS — R918 Other nonspecific abnormal finding of lung field: Secondary | ICD-10-CM

## 2019-08-20 DIAGNOSIS — E039 Hypothyroidism, unspecified: Secondary | ICD-10-CM | POA: Diagnosis not present

## 2019-08-28 ENCOUNTER — Ambulatory Visit
Admission: RE | Admit: 2019-08-28 | Discharge: 2019-08-28 | Disposition: A | Discharge status: Home / Self Care | Payer: Medicare HMO | Source: Ambulatory Visit | Attending: Oncology | Admitting: Oncology

## 2019-08-28 ENCOUNTER — Encounter: Payer: Self-pay | Admitting: *Deleted

## 2019-08-28 ENCOUNTER — Other Ambulatory Visit: Payer: Self-pay

## 2019-08-28 DIAGNOSIS — R918 Other nonspecific abnormal finding of lung field: Secondary | ICD-10-CM

## 2019-08-28 DIAGNOSIS — J439 Emphysema, unspecified: Secondary | ICD-10-CM | POA: Diagnosis not present

## 2019-08-28 LAB — POCT I-STAT CREATININE: Creatinine, Ser: 0.7 mg/dL (ref 0.44–1.00)

## 2019-08-28 MED ORDER — IOHEXOL 300 MG/ML  SOLN
75.0000 mL | Freq: Once | INTRAMUSCULAR | Status: AC | PRN
  Administered 2019-08-28: 75 mL via INTRAVENOUS

## 2019-08-29 ENCOUNTER — Ambulatory Visit: Payer: Medicare HMO | Admitting: Pulmonary Disease

## 2019-08-29 ENCOUNTER — Other Ambulatory Visit: Payer: Self-pay | Admitting: Family Medicine

## 2019-08-29 ENCOUNTER — Encounter: Payer: Self-pay | Admitting: Pulmonary Disease

## 2019-08-29 VITALS — BP 112/60 | HR 62 | Temp 97.3°F | Ht 61.0 in | Wt 113.4 lb

## 2019-08-29 DIAGNOSIS — J432 Centrilobular emphysema: Secondary | ICD-10-CM | POA: Diagnosis not present

## 2019-08-29 DIAGNOSIS — F17210 Nicotine dependence, cigarettes, uncomplicated: Secondary | ICD-10-CM

## 2019-08-29 DIAGNOSIS — M069 Rheumatoid arthritis, unspecified: Secondary | ICD-10-CM | POA: Diagnosis not present

## 2019-08-29 DIAGNOSIS — J8489 Other specified interstitial pulmonary diseases: Secondary | ICD-10-CM

## 2019-08-29 DIAGNOSIS — E039 Hypothyroidism, unspecified: Secondary | ICD-10-CM

## 2019-08-29 MED ORDER — PREDNISONE 5 MG PO TABS: 10.0000 mg | ORAL_TABLET | Freq: Every day | ORAL | 1 refills | Status: DC

## 2019-08-29 NOTE — Progress Notes (Signed)
Subjective:    Patient ID: Tamara Whitehead, female    DOB: 1945/07/19, 74 y.o.   MRN: [id number]  HPI Patient is a 74 year old current smoker (0.5 PPD) who was being evaluated for a right upper lobe masslike opacity.  Patient had had pneumonia in mid November 2020 and was hospitalized for approximately 5 days during that time.  Her chest x-ray did not clear during her admission.  She had a very dense right upper lobe infiltrate.  She was treated with IV antibiotics and was sent home on p.o. antibiotics to complete a 10-day course.  A CT scan was performed on 02 July 2019 which showed persistent masslike density in the right upper lobe.  She was initially seen at the multidisciplinary thoracic oncology clinic on 05 July 2019.  He had a PET/CT end of February that showed intense hypermetabolism on the right upper lobe process.  She underwent navigational bronchoscopy on 17 July 2019 noted that biopsies had lymphocytic infiltration and inflammation but no malignancy.  She had a small iatrogenic pneumothorax from the procedure however did not require chest tube and it resolved on its own without further intervention.  Working diagnosis at that point was organizing pneumonia and postinflammatory changes.  The patient was placed on prednisone 20 mg daily after discussion with Dr. Grayland Ormond.  She just finished prednisone 3 days ago.  Of note the patient has a history of rheumatoid arthritis and is on adalimumab.  Since her bronchoscopy she has not had any complaints.  No chest pain no fevers, chills or sweats.  No cough or sputum production.  She continues to smoke half a pack of cigarettes per day.   She had a CT scan of the chest yesterday which showed marked decrease on the right upper lobe process consistent with inflammatory process.    Review of Systems A 10 point review of systems was performed and it is as noted above otherwise negative.    Objective:   Physical Exam BP 112/60 (BP  Location: Left Arm, Patient Position: Sitting, Cuff Size: Normal)   Pulse 62   Temp (!) 97.3 F (36.3 C) (Temporal)   Ht 5\' 1"  (1.549 m)   Wt 113 lb 6.4 oz (51.4 kg)   SpO2 98%   BMI 21.43 kg/m  GENERAL: Thin well-developed woman, fully ambulatory, no respiratory distress HEAD: Normocephalic, atraumatic.  EYES: Pupils equal, round, reactive to light.  No scleral icterus.  MOUTH: Nose/mouth/throat not examined due to masking requirements for COVID 19. NECK: Supple. No thyromegaly. No nodules. No JVD.  Trachea midline, no crepitus. PULMONARY: Lungs clear to auscultation bilaterally.  No adventitious sounds CARDIOVASCULAR: S1 and S2. Regular rate and rhythm.  No murmurs appreciated. GASTROINTESTINAL: Flat, nondistended, soft. MUSCULOSKELETAL: No joint swelling, no clubbing, no edema.  She does have Heberden's nodes both hands. NEUROLOGIC: Awake alert, no overt focal deficits.  Fluent speech. SKIN: Intact,warm,dry.  Limited examination shows no rashes. PSYCH: Mood and behavior appropriate   Representative slice from CT performed 28 August 2019, independently reviewed:     Assessment & Plan:   Organizing pneumonia Masslike density right upper lobe Process continues to slowly resolve We will continue prednisone at 10 mg daily x2 weeks Decrease prednisone to 5 mg daily until seen again Follow-up in 6 weeks time with chest x-ray at that time She will need follow-up CT scan of the chest 3 months from now No need for further oncologic follow-up at present Discussed with Dr. Grayland Ormond via secure chat  Rheumatoid  arthritis of multiple joints On adalimumab This issue adds complexity to her management vis--vis findings above She will have a tendency towards organizing pneumonia with inflammatory insult to the lung Continue management per her rheumatologist  Centrilobular emphysema Tobacco dependence due to cigarettes Patient counseled with regards to discontinuation of smoking Total  counseling time 3 to 5 minutes   C. Derrill Kay, MD [place] [facility]  *This note was dictated using voice recognition software/Dragon.  Despite best efforts to proofread, errors can occur which can change the meaning.  Any change was purely unintentional.

## 2019-08-29 NOTE — Patient Instructions (Addendum)
You will take prednisone 2 tablets daily (10 mg) for 2 weeks then decrease to 1 tablet daily (5 mg) until seen again.  We will see you back in 6 weeks time with a chest x-ray at that time.  We will plan to follow-up with a CT scan 3 months from now.

## 2019-09-16 ENCOUNTER — Ambulatory Visit (INDEPENDENT_AMBULATORY_CARE_PROVIDER_SITE_OTHER): Payer: Medicare HMO | Admitting: Pharmacist

## 2019-09-16 DIAGNOSIS — I4892 Unspecified atrial flutter: Secondary | ICD-10-CM | POA: Diagnosis not present

## 2019-09-16 DIAGNOSIS — I10 Essential (primary) hypertension: Secondary | ICD-10-CM | POA: Diagnosis not present

## 2019-09-16 NOTE — Chronic Care Management (AMB) (Signed)
Chronic Care Management   Follow Up Note   09/16/2019 Name: Tamara Whitehead MRN: [id number] DOB: 1945-12-04  Referred by: Leone Haven, MD Reason for referral : Chronic Care Management (Medication Management)   PRIYANKA CAUSEY is a 74 y.o. year old female who is a primary care patient of [name], Angela Adam, MD. The CCM team was consulted for assistance with chronic disease management and care coordination needs.    Contacted patient for medication management review.   Review of patient status, including review of consultants reports, relevant laboratory and other test results, and collaboration with appropriate care team members and the patient's provider was performed as part of comprehensive patient evaluation and provision of chronic care management services.    SDOH (Social Determinants of Health) assessments performed: Yes See Care Plan activities for detailed interventions related to [facility])     Outpatient Encounter Medications as of 09/16/2019  Medication Sig  . acetaminophen (TYLENOL) 500 MG tablet Take 1,000 mg by mouth every 6 (six) hours as needed for moderate pain.  . Adalimumab 40 MG/0.8ML PNKT Inject 40 mg into the skin every 14 (fourteen) days.  . Calcium Carbonate (CALCIUM 600 PO) Take 600 mg by mouth every other day.   . cetirizine (ZYRTEC) 10 MG tablet Take 10 mg by mouth daily.  . Coenzyme Q-10 100 MG capsule Take 100 mg by mouth daily.  Marland Kitchen ELIQUIS 5 MG TABS tablet Take 5 mg by mouth in the morning and at bedtime.  Marland Kitchen levothyroxine (SYNTHROID) 50 MCG tablet TAKE 1 TABLET(50 MCG) BY MOUTH DAILY BEFORE BREAKFAST  . losartan-hydrochlorothiazide (HYZAAR) 50-12.5 MG tablet Take 0.5 tablets by mouth daily. In am  . metoprolol tartrate (LOPRESSOR) 25 MG tablet TAKE 1 TABLET(25 MG) BY MOUTH TWICE DAILY  . Multiple Vitamin (MULTIVITAMIN) tablet Take 1 tablet by mouth daily.  . predniSONE (DELTASONE) 5 MG tablet Take 2 tablets (10 mg total) by mouth daily with breakfast. 2  tablets daily for 2 weeks then decrease to 1 tablet daily until seen by MD  . [DISCONTINUED] diphenhydrAMINE (BENADRYL) 25 MG tablet Take 25 mg by mouth at bedtime.   No facility-administered encounter medications on file as of 09/16/2019.     Objective:   Goals Addressed            This Visit's Progress     Patient Stated   . COMPLETED: "I need help with my medications" (pt-stated)       CARE PLAN ENTRY (see longtitudinal plan of care for additional care plan information)  Current Barriers:  . Polypharmacy; complex patient with multiple comorbidities including atrial fibrillation, HLD, HTN, RA . Reports that she was approved for Medicare Extra Help - all brand copays are $9.20/90 days and generics $3.70/90. Reports that she also received an application for Medicaid, but she notes that she believes she is over the income requirement since she receives federal and state unemployement . Self-manages medications.  o Afib/HTN: metoprolol tartrate 25 mg BID, losartan 50/12.5 mg, 1/2 tab QAM; reports home SBP 120s, occasionally 140s; last office reading at Dr. Domingo Dimes office well at goal <130/80. Has appt w/ Dr. Ubaldo Glassing next week o RA: Humira Q14 days; follows w/ Dr. Jefm Rosman. o HLD: rosuvastatin held at discharge d/t elevated LFTs; repeat post discharge LFT were normal; ASCVD risk score 27%.  o Hypothyroidism: levothyroxine 50 mcg daily o Lung mass biopsy just showed inflammatory processes; improvement s/p prednisone course, so continued on prednisone 5 mg for a few more weeks. Will  f/u with Dr. Patsey Berthold in a few weeks.   Pharmacist Clinical Goal(s):  Marland Kitchen Over the next 90 days, patient will work with PharmD and provider towards optimized medication management  Interventions: . Comprehensive medication review performed, medication list updated in electronic medical record . Inter-disciplinary care team collaboration (see longitudinal plan of care) . Reviewed principals of Medicare Extra  Help. Encouraged to seek 90 day supplies of chronic medications to save money. Patient verbalized understanding . Patient notes that the Medicaid application also included information for a Case Worker. She is going to call the listed number to see if anyone can help discern if it would be worth her time applying for Medicaid, as she would need to collect old employment information and years worth of medical bills . Reviewed importance of follow up with providers. Praised for scheduling f/u with Dr. Ubaldo Glassing.  . Reviewed benefit of statin therapy. Encouraged to discuss restarting rosuvastatin w/ Dr. Ubaldo Glassing at her appointment next week.   Patient Self Care Activities:  . Patient will take medications as prescribed  Please see past updates related to this goal by clicking on the "Past Updates" button in the selected goal          Plan:  - Patient denies any other questions or concerns at this time. She has my contact information for future needs.   Catie Darnelle Maffucci, PharmD, [place], CPP Clinical Pharmacist [place] [phone]

## 2019-09-16 NOTE — Patient Instructions (Signed)
Visit Information  Goals Addressed            This Visit's Progress     Patient Stated   . COMPLETED: "I need help with my medications" (pt-stated)       CARE PLAN ENTRY (see longtitudinal plan of care for additional care plan information)  Current Barriers:  . Polypharmacy; complex patient with multiple comorbidities including atrial fibrillation, HLD, HTN, RA . Reports that she was approved for Medicare Extra Help - all brand copays are $9.20/90 days and generics $3.70/90. Reports that she also received an application for Medicaid, but she notes that she believes she is over the income requirement since she receives federal and state unemployement . Self-manages medications.  o Afib/HTN: metoprolol tartrate 25 mg BID, losartan 50/12.5 mg, 1/2 tab QAM; reports home SBP 120s, occasionally 140s; last office reading at Dr. Domingo Dimes office well at goal <130/80. Has appt w/ Dr. Ubaldo Glassing next week o RA: Humira Q14 days; follows w/ Dr. Jefm Calandra. o HLD: rosuvastatin held at discharge d/t elevated LFTs; repeat post discharge LFT were normal; ASCVD risk score 27%.  o Hypothyroidism: levothyroxine 50 mcg daily o Lung mass biopsy just showed inflammatory processes; improvement s/p prednisone course, so continued on prednisone 5 mg for a few more weeks. Will f/u with Dr. Patsey Berthold in a few weeks.   Pharmacist Clinical Goal(s):  Marland Kitchen Over the next 90 days, patient will work with PharmD and provider towards optimized medication management  Interventions: . Comprehensive medication review performed, medication list updated in electronic medical record . Inter-disciplinary care team collaboration (see longitudinal plan of care) . Reviewed principals of Medicare Extra Help. Encouraged to seek 90 day supplies of chronic medications to save money. Patient verbalized understanding . Patient notes that the Medicaid application also included information for a Case Worker. She is going to call the listed number to  see if anyone can help discern if it would be worth her time applying for Medicaid, as she would need to collect old employment information and years worth of medical bills . Reviewed importance of follow up with providers. Praised for scheduling f/u with Dr. Ubaldo Glassing.  . Reviewed benefit of statin therapy. Encouraged to discuss restarting rosuvastatin w/ Dr. Ubaldo Glassing at her appointment next week.   Patient Self Care Activities:  . Patient will take medications as prescribed  Please see past updates related to this goal by clicking on the "Past Updates" button in the selected goal         Patient verbalizes understanding of instructions provided today.   Plan:  - Patient denies any other questions or concerns at this time. She has my contact information for future needs.  Catie Darnelle Maffucci, PharmD, [place], CPP  Clinical Pharmacist  [place]  [phone]

## 2019-09-26 DIAGNOSIS — Z720 Tobacco use: Secondary | ICD-10-CM | POA: Diagnosis not present

## 2019-09-26 DIAGNOSIS — I4892 Unspecified atrial flutter: Secondary | ICD-10-CM | POA: Diagnosis not present

## 2019-09-26 DIAGNOSIS — I10 Essential (primary) hypertension: Secondary | ICD-10-CM | POA: Diagnosis not present

## 2019-09-26 DIAGNOSIS — D696 Thrombocytopenia, unspecified: Secondary | ICD-10-CM | POA: Diagnosis not present

## 2019-10-16 ENCOUNTER — Encounter: Payer: Self-pay | Admitting: Pulmonary Disease

## 2019-10-16 ENCOUNTER — Ambulatory Visit
Admission: RE | Admit: 2019-10-16 | Discharge: 2019-10-16 | Disposition: A | Discharge status: Home / Self Care | Payer: Medicare HMO | Source: Ambulatory Visit | Attending: Pulmonary Disease | Admitting: Pulmonary Disease

## 2019-10-16 ENCOUNTER — Ambulatory Visit: Payer: Medicare HMO | Admitting: Pulmonary Disease

## 2019-10-16 ENCOUNTER — Telehealth: Payer: Self-pay | Admitting: Pulmonary Disease

## 2019-10-16 ENCOUNTER — Other Ambulatory Visit: Payer: Self-pay

## 2019-10-16 VITALS — BP 128/72 | HR 55 | Temp 96.6°F | Ht 61.0 in | Wt 117.8 lb

## 2019-10-16 DIAGNOSIS — J8489 Other specified interstitial pulmonary diseases: Secondary | ICD-10-CM | POA: Insufficient documentation

## 2019-10-16 DIAGNOSIS — M069 Rheumatoid arthritis, unspecified: Secondary | ICD-10-CM

## 2019-10-16 DIAGNOSIS — F17210 Nicotine dependence, cigarettes, uncomplicated: Secondary | ICD-10-CM

## 2019-10-16 DIAGNOSIS — J189 Pneumonia, unspecified organism: Secondary | ICD-10-CM | POA: Diagnosis not present

## 2019-10-16 NOTE — Progress Notes (Signed)
Subjective:    Patient ID: Tamara Whitehead, female    DOB: May 30, 1945, 74 y.o.   MRN: [id number]  HPI Patient is a 74 year old current smoker (0.5 PPD) who following here for a right upper lobe masslike opacity.  Patient underwent bronchoscopy with navigational assistance on 17 July 2019 biopsies were consistent with an organizing pneumonia.  Patient has been treated with steroids and her process is resolving.  She presents today doing well.  She has not had any fevers, chills or sweats.  She has had no weight loss or anorexia.  She has is on Humira (adalimumab) for rheumatoid arthritis.  She is down to 5 mg of prednisone daily.  Last CT scan of the chest was on 28 August 2019.  Patient voices no other complaints.  She feels well and looks well.   Review of Systems A 10 point review of systems was performed and it is as noted above otherwise negative.    Objective:   Physical Exam BP 128/72 (BP Location: Left Arm, Cuff Size: Normal)   Pulse (!) 55   Temp (!) 96.6 F (35.9 C) (Temporal)   Ht 5\' 1"  (1.549 m)   Wt 117 lb 12.8 oz (53.4 kg)   SpO2 99%   BMI 22.26 kg/m   GENERAL: Thin well-developed woman, fully ambulatory, no respiratory distress HEAD: Normocephalic, atraumatic.  EYES: Pupils equal, round, reactive to light. No scleral icterus.  MOUTH: Nose/mouth/throat not examined due to masking requirements for COVID 19. NECK: Supple. No thyromegaly. No nodules. No JVD. Trachea midline, no crepitus. PULMONARY: Lungs clear to auscultation bilaterally. No adventitious sounds CARDIOVASCULAR: S1 and S2. Regular rate and rhythm. No murmurs appreciated. GASTROINTESTINAL: Benign.   MUSCULOSKELETAL: Nojoint swelling, no clubbing, no edema. Mild RA changes and OA changes both hands.   NEUROLOGIC: Awake alert, no overt focal deficits. Fluent speech. SKIN: Intact,warm,dry. Limited examination shows no rashes. PSYCH: Mood and behavior appropriate    Chest x-ray performed today.   Shows continued decrease of the right upper lobe process.  Independently reviewed:       Assessment & Plan:     ICD-10-CM   1. Organizing pneumonia [facility])  J84.89 CT Chest Wo Contrast   Continued improvement on today's chest x-ray Follow-up with chest CT in October Taper off prednisone Follow-up visit after chest CT  2. Rheumatoid arthritis involving multiple joints (HCC)  M06.9    On adalimumab This issue adds complexity to her management  3. Tobacco dependence due to cigarettes  F17.210    She is currently decreasing cigarette use Counseled 3 to 5 minutes   Discussion:  Patient had organizing pneumonia resulting from a very severe pneumonia in November 2020.  Chest x-ray today shows continued improvement of the organizing pneumonia.  Will need repeat CT scan of the chest in October.  The patient will taper off her prednisone by taking 5 mg p.o. daily every other day for 1 week and then stop.  With regards to COVID-19 immunization the patient may have the immunization.  It is explained to her that it is unknown what effects on the immunity conferred by the vaccine Humira may have.  We will see the patient in follow-up after her October CT.  She is to contact us prior to that time should any new difficulties arise.  Renold Don, MD [place] [facility]   *This note was dictated using voice recognition software/Dragon.  Despite best efforts to proofread, errors can occur which can change the meaning.  Any  change was purely unintentional.

## 2019-10-16 NOTE — Telephone Encounter (Signed)
Spoke with the pt and notified of response per Dr Darnell Level and she verbalized understanding.

## 2019-10-16 NOTE — Telephone Encounter (Signed)
Yes she may get the vaccine.  I would recommend Pfizer or Moderna over the other 2.  Though there is no specific interaction with Humira and the vaccine she should be aware that the vaccine may not impart the same level of immunity you were not taking Humira.  However she should go ahead and take it.

## 2019-10-16 NOTE — Telephone Encounter (Signed)
Dr Patsey Berthold, do you rec that the pt go ahead and get her covid vaccine at this time? Please advise, thanks!

## 2019-10-16 NOTE — Patient Instructions (Signed)
We will repeat a CT scan of the chest towards the end of October, we will see him in follow-up after that.   Your chest x-ray today continues to show marked improvement.

## 2019-11-01 ENCOUNTER — Other Ambulatory Visit: Payer: Self-pay | Admitting: Family Medicine

## 2019-11-22 ENCOUNTER — Telehealth: Payer: Self-pay | Admitting: Family Medicine

## 2019-11-22 DIAGNOSIS — I10 Essential (primary) hypertension: Secondary | ICD-10-CM

## 2019-11-22 MED ORDER — LOSARTAN POTASSIUM-HCTZ 50-12.5 MG PO TABS: 0.5000 | ORAL_TABLET | Freq: Every day | ORAL | 0 refills | Status: DC

## 2019-11-22 NOTE — Telephone Encounter (Signed)
Pt needs refill on losartan-hydrochlorothiazide (HYZAAR) 50-12.5 MG tablet. Pt is on last pill

## 2019-11-26 ENCOUNTER — Other Ambulatory Visit: Payer: Self-pay | Admitting: Internal Medicine

## 2019-12-22 ENCOUNTER — Other Ambulatory Visit: Payer: Self-pay | Admitting: Family Medicine

## 2020-01-16 ENCOUNTER — Ambulatory Visit (INDEPENDENT_AMBULATORY_CARE_PROVIDER_SITE_OTHER): Payer: Medicare HMO

## 2020-01-16 VITALS — Ht 61.0 in | Wt 117.0 lb

## 2020-01-16 DIAGNOSIS — Z0000 Encounter for general adult medical examination without abnormal findings: Secondary | ICD-10-CM

## 2020-01-16 NOTE — Patient Instructions (Addendum)
Tamara Whitehead , Thank you for taking time to come for your Medicare Wellness Visit. I appreciate your ongoing commitment to your health goals. Please review the following plan we discussed and let me know if I can assist you in the future.   These are the goals we discussed: Goals      Patient Stated   .  Follow up with Primary Care Provider (pt-stated)      As needed       This is a list of the screening recommended for you and due dates:  Health Maintenance  Topic Date Due  . COVID-19 Vaccine (1) 02/01/2020*  . Flu Shot  04/17/2020*  . Mammogram  01/15/2021*  . DEXA scan (bone density measurement)  01/15/2021*  . Tetanus Vaccine  01/15/2021*  . Cologuard (Stool DNA test)  03/24/2022  .  Hepatitis C: One time screening is recommended by Center for Disease Control  (CDC) for  adults born from [birth year] through 1966.   Completed  . Pneumonia vaccines  Completed  *Topic was postponed. The date shown is not the original due date.    Immunizations Immunization History  Administered Date(s) Administered  . H1N1 04/07/2008  . Influenza Inj Mdck Quad Pf 06/04/2019  . Influenza Split 05/28/2010  . PPD Test 05/11/2015  . Pneumococcal Conjugate-13 09/29/2014  . Pneumococcal Polysaccharide-23 10/07/2011  . Pneumococcal-Unspecified 10/07/2011    Advanced directives: mailed per patient request  Conditions/risks identified: none new  Follow up in one year for your annual wellness visit.   Preventive Care 58 Years and Older, Female Preventive care refers to lifestyle choices and visits with your health care provider that can promote health and wellness. What does preventive care include?  A yearly physical exam. This is also called an annual well check.  Dental exams once or twice a year.  Routine eye exams. Ask your health care provider how often you should have your eyes checked.  Personal lifestyle choices, including:  Daily care of your teeth and gums.  Regular physical  activity.  Eating a healthy diet.  Avoiding tobacco and drug use.  Limiting alcohol use.  Practicing safe sex.  Taking low-dose aspirin every day.  Taking vitamin and mineral supplements as recommended by your health care provider. What happens during an annual well check? The services and screenings done by your health care provider during your annual well check will depend on your age, overall health, lifestyle risk factors, and family history of disease. Counseling  Your health care provider may ask you questions about your:  Alcohol use.  Tobacco use.  Drug use.  Emotional well-being.  Home and relationship well-being.  Sexual activity.  Eating habits.  History of falls.  Memory and ability to understand (cognition).  Work and work Statistician.  Reproductive health. Screening  You may have the following tests or measurements:  Height, weight, and BMI.  Blood pressure.  Lipid and cholesterol levels. These may be checked every 5 years, or more frequently if you are over 78 years old.  Skin check.  Lung cancer screening. You may have this screening every year starting at age 74 if you have a 30-pack-year history of smoking and currently smoke or have quit within the past 15 years.  Fecal occult blood test (FOBT) of the stool. You may have this test every year starting at age 66.  Flexible sigmoidoscopy or colonoscopy. You may have a sigmoidoscopy every 5 years or a colonoscopy every 10 years starting at age 39.  Hepatitis C blood test.  Hepatitis B blood test.  Sexually transmitted disease (STD) testing.  Diabetes screening. This is done by checking your blood sugar (glucose) after you have not eaten for a while (fasting). You may have this done every 1-3 years.  Bone density scan. This is done to screen for osteoporosis. You may have this done starting at age 47.  Mammogram. This may be done every 1-2 years. Talk to your health care provider about  how often you should have regular mammograms. Talk with your health care provider about your test results, treatment options, and if necessary, the need for more tests. Vaccines  Your health care provider may recommend certain vaccines, such as:  Influenza vaccine. This is recommended every year.  Tetanus, diphtheria, and acellular pertussis (Tdap, Td) vaccine. You may need a Td booster every 10 years.  Zoster vaccine. You may need this after age 57.  Pneumococcal 13-valent conjugate (PCV13) vaccine. One dose is recommended after age 81.  Pneumococcal polysaccharide (PPSV23) vaccine. One dose is recommended after age 73. Talk to your health care provider about which screenings and vaccines you need and how often you need them. This information is not intended to replace advice given to you by your health care provider. Make sure you discuss any questions you have with your health care provider. Document Released: 06/05/2015 Document Revised: 01/27/2016 Document Reviewed: 03/10/2015 Elsevier Interactive Patient Education  2017 [place] Prevention in the Home Falls can cause injuries. They can happen to people of all ages. There are many things you can do to make your home safe and to help prevent falls. What can I do on the outside of my home?  Regularly fix the edges of walkways and driveways and fix any cracks.  Remove anything that might make you trip as you walk through a door, such as a raised step or threshold.  Trim any bushes or trees on the path to your home.  Use bright outdoor lighting.  Clear any walking paths of anything that might make someone trip, such as rocks or tools.  Regularly check to see if handrails are loose or broken. Make sure that both sides of any steps have handrails.  Any raised decks and porches should have guardrails on the edges.  Have any leaves, snow, or ice cleared regularly.  Use sand or salt on walking paths during  winter.  Clean up any spills in your garage right away. This includes oil or grease spills. What can I do in the bathroom?  Use night lights.  Install grab bars by the toilet and in the tub and shower. Do not use towel bars as grab bars.  Use non-skid mats or decals in the tub or shower.  If you need to sit down in the shower, use a plastic, non-slip stool.  Keep the floor dry. Clean up any water that spills on the floor as soon as it happens.  Remove soap buildup in the tub or shower regularly.  Attach bath mats securely with double-sided non-slip rug tape.  Do not have throw rugs and other things on the floor that can make you trip. What can I do in the bedroom?  Use night lights.  Make sure that you have a light by your bed that is easy to reach.  Do not use any sheets or blankets that are too big for your bed. They should not hang down onto the floor.  Have a firm chair that has side  arms. You can use this for support while you get dressed.  Do not have throw rugs and other things on the floor that can make you trip. What can I do in the kitchen?  Clean up any spills right away.  Avoid walking on wet floors.  Keep items that you use a lot in easy-to-reach places.  If you need to reach something above you, use a strong step stool that has a grab bar.  Keep electrical cords out of the way.  Do not use floor polish or wax that makes floors slippery. If you must use wax, use non-skid floor wax.  Do not have throw rugs and other things on the floor that can make you trip. What can I do with my stairs?  Do not leave any items on the stairs.  Make sure that there are handrails on both sides of the stairs and use them. Fix handrails that are broken or loose. Make sure that handrails are as long as the stairways.  Check any carpeting to make sure that it is firmly attached to the stairs. Fix any carpet that is loose or worn.  Avoid having throw rugs at the top or  bottom of the stairs. If you do have throw rugs, attach them to the floor with carpet tape.  Make sure that you have a light switch at the top of the stairs and the bottom of the stairs. If you do not have them, ask someone to add them for you. What else can I do to help prevent falls?  Wear shoes that:  Do not have high heels.  Have rubber bottoms.  Are comfortable and fit you well.  Are closed at the toe. Do not wear sandals.  If you use a stepladder:  Make sure that it is fully opened. Do not climb a closed stepladder.  Make sure that both sides of the stepladder are locked into place.  Ask someone to hold it for you, if possible.  Clearly mark and make sure that you can see:  Any grab bars or handrails.  First and last steps.  Where the edge of each step is.  Use tools that help you move around (mobility aids) if they are needed. These include:  Canes.  Walkers.  Scooters.  Crutches.  Turn on the lights when you go into a dark area. Replace any light bulbs as soon as they burn out.  Set up your furniture so you have a clear path. Avoid moving your furniture around.  If any of your floors are uneven, fix them.  If there are any pets around you, be aware of where they are.  Review your medicines with your doctor. Some medicines can make you feel dizzy. This can increase your chance of falling. Ask your doctor what other things that you can do to help prevent falls. This information is not intended to replace advice given to you by your health care provider. Make sure you discuss any questions you have with your health care provider. Document Released: 03/05/2009 Document Revised: 10/15/2015 Document Reviewed: 06/13/2014 Elsevier Interactive Patient Education  2017 [employer].

## 2020-01-16 NOTE — Progress Notes (Addendum)
Subjective:   Tamara Whitehead is a 74 y.o. female who presents for Medicare Annual (Subsequent) preventive examination.  Review of Systems    No ROS.  Medicare Wellness Virtual Visit.     Cardiac Risk Factors include: advanced age (>35men, >38 women);hypertension     Objective:    Today's Vitals   01/16/20 0904  Weight: 117 lb (53.1 kg)  Height: 5\' 1"  (1.549 m)   Body mass index is 22.11 kg/m.  Advanced Directives 01/16/2020 08/20/2019 07/23/2019 07/17/2019 07/12/2019 07/04/2019 04/05/2019  Does Patient Have a Medical Advance Directive? No No Yes No No Yes Yes  Type of Advance Directive - - - Living will - Living will Living will  Does patient want to make changes to medical advance directive? - No - Patient declined - No - Patient declined No - Patient declined No - Patient declined No - Patient declined  Copy of [place] in [place]  Would patient like information on creating a medical advance directive? No - Patient declined - - No - Patient declined - - Yes (Inpatient - patient requests chaplain consult to create a medical advance directive)    Current Medications (verified) Outpatient Encounter Medications as of 01/16/2020  Medication Sig   acetaminophen (TYLENOL) 500 MG tablet Take 1,000 mg by mouth every 6 (six) hours as needed for moderate pain.   Adalimumab 40 MG/0.8ML PNKT Inject 40 mg into the skin every 14 (fourteen) days.   Calcium Carbonate (CALCIUM 600 PO) Take 600 mg by mouth every other day.    cetirizine (ZYRTEC) 10 MG tablet Take 10 mg by mouth daily.   Coenzyme Q-10 100 MG capsule Take 100 mg by mouth daily.   ELIQUIS 5 MG TABS tablet TAKE 1 TABLET(5 MG) BY MOUTH TWICE DAILY   levothyroxine (SYNTHROID) 50 MCG tablet TAKE 1 TABLET(50 MCG) BY MOUTH DAILY BEFORE BREAKFAST   losartan-hydrochlorothiazide (HYZAAR) 50-12.5 MG tablet Take 0.5 tablets by mouth daily. In am   metoprolol tartrate (LOPRESSOR) 25 MG tablet TAKE 1  TABLET(25 MG) BY MOUTH TWICE DAILY   Multiple Vitamin (MULTIVITAMIN) tablet Take 1 tablet by mouth daily.   predniSONE (DELTASONE) 5 MG tablet Take 2 tablets (10 mg total) by mouth daily with breakfast. 2 tablets daily for 2 weeks then decrease to 1 tablet daily until seen by MD   No facility-administered encounter medications on file as of 01/16/2020.    Allergies (verified) Codeine and Diltiazem   History: Past Medical History:  Diagnosis Date   Anxiety    Arthritis    Osteoarthritis/Rheumatoid   Atrial flutter (HCC)    Cancer (HCC)    Skin   Cataract    Community acquired pneumonia 04/04/2019   Fibrocystic breast disease    Glaucoma    Hypercholesterolemia    Hypertension    Hypothyroidism    Rheumatoid arthritis ([place])    Thyroid disease    Hypothyroid   Past Surgical History:  Procedure Laterality Date   ABDOMINAL HYSTERECTOMY     Total abd. hysterectomu and bilateral salping-oophorectomy   EYE SURGERY Right    Glaucoma surgery and cateract   HEMORRHOID SURGERY     status post dilatation and curettage  1975   TONSILLECTOMY     TONSILLECTOMY     TUBAL LIGATION     VIDEO BRONCHOSCOPY WITH ENDOBRONCHIAL NAVIGATION N/A 07/17/2019   Procedure: VIDEO BRONCHOSCOPY WITH ENDOBRONCHIAL NAVIGATION;  Surgeon: Tyler Pita, MD;  [facility];  Service: Pulmonary;  Laterality: N/A;   Family History  Problem Relation Age of Onset   CAD Mother    Arthritis Mother        Rheumatoid Arthritis   Heart failure Mother    Arthritis Father        Rheumatoid/Osteoarthritis and Blindness, bleeding Tendencies,Glaucoma   CAD Father    Heart failure Father    Hypertension Maternal Grandmother    COPD Neg Hx    Social History   Socioeconomic History   Marital status: Divorced    Spouse name: Not on file   Number of children: Not on file   Years of education: Not on file   Highest education level: Not on file  Occupational History     Not on file  Tobacco Use   Smoking status: Current Every Day Smoker    Packs/day: 0.50    Years: 30.00    Pack years: 15.00    Types: Cigarettes   Smokeless tobacco: Never Used  Scientific laboratory technician Use: Never used  Substance and Sexual Activity   Alcohol use: Yes    Comment: rarely   Drug use: No   Sexual activity: Not on file  Other Topics Concern   Not on file  Social History Narrative   Not on file   Social Determinants of Health   Financial Resource Strain: Low Risk    Difficulty of Paying Living Expenses: Not very hard  Food Insecurity: No Food Insecurity   Worried About Charity fundraiser in the Last Year: Never true   Ran Out of Food in the Last Year: Never true  Transportation Needs: No Transportation Needs   Lack of Transportation (Medical): No   Lack of Transportation (Non-Medical): No  Physical Activity:    Days of Exercise per Week: Not on file   Minutes of Exercise per Session: Not on file  Stress: No Stress Concern Present   Feeling of Stress : Only a little  Social Connections:    Frequency of Communication with Friends and Family: Not on file   Frequency of Social Gatherings with Friends and Family: Not on file   Attends Religious Services: Not on Electrical engineer or Organizations: Not on file   Attends Archivist Meetings: Not on file   Marital Status: Not on file    Tobacco Counseling Ready to quit: Not Answered Counseling given: Not Answered   Clinical Intake:  Pre-visit preparation completed: Yes        Diabetes: No  How often do you need to have someone help you when you read instructions, pamphlets, or other written materials from your doctor or pharmacy?: 1 - Never Interpreter Needed?: No      Activities of Daily Living In your present state of health, do you have any difficulty performing the following activities: 01/16/2020 07/12/2019  Hearing? N N  Vision? N N  Difficulty  concentrating or making decisions? N N  Walking or climbing stairs? N N  Dressing or bathing? N N  Doing errands, shopping? N N  Preparing Food and eating ? N -  Using the Toilet? N -  In the past six months, have you accidently leaked urine? N -  Do you have problems with loss of bowel control? N -  Managing your Medications? N -  Managing your Finances? N -  Housekeeping or managing your Housekeeping? N -  Some recent data might be hidden  Patient Care Team: Leone Haven, MD as PCP - General (Family Medicine) Telford Nab, RN as Oncology Nurse Navigator Grayland Ormond, Kathlene November, MD as Consulting Physician (Oncology)  Indicate any recent Medical Services you may have received from other than [facility] providers in the past year (date may be approximate).     Assessment:   This is a routine wellness examination for [place].  I connected with Saylee today by telephone and verified that I am speaking with the correct person using two identifiers. Location patient: home Location provider: work Persons participating in the virtual visit: patient, Marine scientist.    I discussed the limitations, risks, security and privacy concerns of performing an evaluation and management service by telephone and the availability of in person appointments. The patient expressed understanding and verbally consented to this telephonic visit.    Interactive audio and video telecommunications were attempted between this provider and patient, however failed, due to patient having technical difficulties OR patient did not have access to video capability.  We continued and completed visit with audio only.  Some vital signs may be absent or patient reported.   Hearing/Vision screen  Hearing Screening   125Hz  250Hz  500Hz  1000Hz  2000Hz  3000Hz  4000Hz  6000Hz  8000Hz   Right ear:           Left ear:           Comments: Patient is able to hear conversational tones without difficulty.  No issues reported.  Vision Screening  Comments: Visual acuity not assessed, virtual visit.  They have seen their ophthalmologist in the last 12 months.     Dietary issues and exercise activities discussed:   Healthy diet Good water intake Caffeine: 1-2 cups of coffee daily  Goals      Patient Stated     Follow up with Primary Care Provider (pt-stated)      As needed      Depression Screen PHQ 2/9 Scores 01/16/2020 06/20/2019 05/03/2019 04/04/2019 01/15/2019 01/10/2018 11/07/2016  PHQ - 2 Score 0 0 0 0 1 0 0  PHQ- 9 Score - - - - - - 2    Fall Risk Fall Risk  01/16/2020 06/20/2019 05/03/2019 04/04/2019 01/15/2019  Falls in the past year? 0 0 0 0 0  Number falls in past yr: 0 - 0 - -  Follow up Falls evaluation completed Falls evaluation completed Falls evaluation completed - -   Handrails in use when climbing stairs? Yes  Home free of loose throw rugs in walkways, pet beds, electrical cords, etc? Yes  Adequate lighting in your home to reduce risk of falls? Yes   TIMED UP AND GO: Was the test performed? No . Virtual visit.   Cognitive Function: MMSE - Mini Mental State Exam 01/10/2018  Orientation to time 5  Orientation to Place 5  Registration 3  Attention/ Calculation 5  Recall 3  Language- name 2 objects 2  Language- repeat 1  Language- follow 3 step command 3  Language- read & follow direction 1  Write a sentence 1  Copy design 1  Total score 30     6CIT Screen 01/15/2019  What Year? 0 points  What month? 0 points  What time? 0 points  Count back from 20 0 points  Months in reverse 0 points  Repeat phrase 0 points  Total Score 0    Immunizations Immunization History  Administered Date(s) Administered   H1N1 04/07/2008   Influenza Inj Mdck Quad Pf 06/04/2019   Influenza Split 05/28/2010  PPD Test 05/11/2015   Pneumococcal Conjugate-13 09/29/2014   Pneumococcal Polysaccharide-23 10/07/2011   Pneumococcal-Unspecified 10/07/2011    TDAP status: Due, Education has been provided  regarding the importance of this vaccine. Advised may receive this vaccine at local pharmacy or Health Dept. Aware to provide a copy of the vaccination record if obtained from local pharmacy or Health Dept. Verbalized acceptance and understanding. Deferred.    Health Maintenance Health Maintenance  Topic Date Due   COVID-19 Vaccine (1) 02/01/2020 (Originally 01/24/1958)   INFLUENZA VACCINE  04/17/2020 (Originally 12/22/2019)   MAMMOGRAM  01/15/2021 (Originally 01/25/1995)   DEXA SCAN  01/15/2021 (Originally 01/24/2010)   TETANUS/TDAP  01/15/2021 (Originally 01/25/1964)   Fecal DNA (Cologuard)  03/24/2022   Hepatitis C Screening  Completed   PNA vac Low Risk Adult  Completed   Covid vaccine- plans to complete.   Mammogram and Dexa Scan deferred deferred per patient preference.   Dental Screening: Recommended annual dental exams for proper oral hygiene.  Community Resource Referral / Chronic Care Management: CRR required this visit?  No   CCM required this visit?  No      Plan:   Keep all routine maintenance appointments.   I have personally reviewed and noted the following in the patients chart:    Medical and social history  Use of alcohol, tobacco or illicit drugs   Current medications and supplements  Functional ability and status  Nutritional status  Physical activity  Advanced directives  List of other physicians  Hospitalizations, surgeries, and ER visits in previous 12 months  Vitals  Screenings to include cognitive, depression, and falls  Referrals and appointments  In addition, I have reviewed and discussed with patient certain preventive protocols, quality metrics, and best practice recommendations. A written personalized care plan for preventive services as well as general preventive health recommendations were provided to patient via mychart.     Varney Biles, LPN   [id number]

## 2020-01-22 DIAGNOSIS — B9689 Other specified bacterial agents as the cause of diseases classified elsewhere: Secondary | ICD-10-CM | POA: Diagnosis not present

## 2020-01-22 DIAGNOSIS — J189 Pneumonia, unspecified organism: Secondary | ICD-10-CM | POA: Diagnosis not present

## 2020-01-22 DIAGNOSIS — R509 Fever, unspecified: Secondary | ICD-10-CM | POA: Diagnosis not present

## 2020-01-22 DIAGNOSIS — Z03818 Encounter for observation for suspected exposure to other biological agents ruled out: Secondary | ICD-10-CM | POA: Diagnosis not present

## 2020-01-22 DIAGNOSIS — R05 Cough: Secondary | ICD-10-CM | POA: Diagnosis not present

## 2020-01-22 DIAGNOSIS — J019 Acute sinusitis, unspecified: Secondary | ICD-10-CM | POA: Diagnosis not present

## 2020-01-22 DIAGNOSIS — R110 Nausea: Secondary | ICD-10-CM | POA: Diagnosis not present

## 2020-02-20 ENCOUNTER — Other Ambulatory Visit: Payer: Self-pay | Admitting: Family Medicine

## 2020-02-20 ENCOUNTER — Other Ambulatory Visit: Payer: Self-pay | Admitting: Internal Medicine

## 2020-02-20 DIAGNOSIS — E039 Hypothyroidism, unspecified: Secondary | ICD-10-CM

## 2020-02-20 DIAGNOSIS — I10 Essential (primary) hypertension: Secondary | ICD-10-CM

## 2020-03-17 ENCOUNTER — Encounter: Payer: Self-pay | Admitting: Pulmonary Disease

## 2020-04-01 ENCOUNTER — Other Ambulatory Visit: Payer: Self-pay | Admitting: Family Medicine

## 2020-04-01 ENCOUNTER — Other Ambulatory Visit: Payer: Self-pay | Admitting: Internal Medicine

## 2020-04-01 DIAGNOSIS — I10 Essential (primary) hypertension: Secondary | ICD-10-CM

## 2020-06-19 ENCOUNTER — Other Ambulatory Visit: Payer: Self-pay | Admitting: Family Medicine

## 2020-07-16 ENCOUNTER — Other Ambulatory Visit: Payer: Self-pay

## 2020-07-16 DIAGNOSIS — D696 Thrombocytopenia, unspecified: Secondary | ICD-10-CM

## 2020-07-16 MED ORDER — APIXABAN 5 MG PO TABS: ORAL_TABLET | ORAL | 1 refills | Status: DC

## 2020-07-16 NOTE — Progress Notes (Signed)
A fax came in today for a refill on eliquis this was senr to p`````````````````````````````````````````````````````````````````````````````````````````````````````````````````````````````````````````````````````````````````````````````````````````````````````````````````````````

## 2020-07-17 ENCOUNTER — Telehealth: Payer: Self-pay | Admitting: Family Medicine

## 2020-07-17 NOTE — Telephone Encounter (Signed)
This patient is due for follow-up.  She will need to have follow-up for further refills of Eliquis.  Thanks.

## 2020-07-17 NOTE — Telephone Encounter (Signed)
No answer, no VM.  Noreen Mackintosh,cma

## 2020-08-20 ENCOUNTER — Other Ambulatory Visit: Payer: Self-pay

## 2020-08-24 ENCOUNTER — Other Ambulatory Visit: Payer: Self-pay

## 2020-08-24 ENCOUNTER — Encounter: Payer: Self-pay | Admitting: Family Medicine

## 2020-08-24 ENCOUNTER — Ambulatory Visit (INDEPENDENT_AMBULATORY_CARE_PROVIDER_SITE_OTHER): Payer: Medicare HMO | Admitting: Family Medicine

## 2020-08-24 DIAGNOSIS — I10 Essential (primary) hypertension: Secondary | ICD-10-CM

## 2020-08-24 DIAGNOSIS — E039 Hypothyroidism, unspecified: Secondary | ICD-10-CM

## 2020-08-24 DIAGNOSIS — F32A Depression, unspecified: Secondary | ICD-10-CM | POA: Insufficient documentation

## 2020-08-24 DIAGNOSIS — F419 Anxiety disorder, unspecified: Secondary | ICD-10-CM

## 2020-08-24 DIAGNOSIS — I4892 Unspecified atrial flutter: Secondary | ICD-10-CM | POA: Diagnosis not present

## 2020-08-24 MED ORDER — METOPROLOL TARTRATE 25 MG PO TABS: ORAL_TABLET | ORAL | 3 refills | Status: DC

## 2020-08-24 MED ORDER — SERTRALINE HCL 50 MG PO TABS: 50.0000 mg | ORAL_TABLET | Freq: Every day | ORAL | 3 refills | Status: DC

## 2020-08-24 MED ORDER — LEVOTHYROXINE SODIUM 50 MCG PO TABS: ORAL_TABLET | ORAL | 1 refills | Status: DC

## 2020-08-24 MED ORDER — LOSARTAN POTASSIUM-HCTZ 50-12.5 MG PO TABS: 1.0000 | ORAL_TABLET | Freq: Every day | ORAL | 3 refills | Status: DC

## 2020-08-24 NOTE — Progress Notes (Signed)
Tommi Rumps, MD Phone: [phone]  Tamara Whitehead is a 75 y.o. female who presents today for f/u.  HYPERTENSION  Disease Monitoring  Home BP Monitoring 140s/80s Chest pain- no    Dyspnea- no Medications  Compliance-  Taking losartan/HCTZ 1/2 tablet daily.   Edema- no  HYPOTHYROIDISM Disease Monitoring Weight changes: no  Skin Changes: no  Heat/Cold intolerance: no  Medication Monitoring Compliance:  Taking synthroid   Last TSH:   Lab Results  Component Value Date   TSH 2.85 07/05/2019   Anxiety: Patient notes quite a bit of anxiety.  She has a lot of financial issues and may be losing her house soon.  She is trying to find a job though she notes it is difficult with her age.  She notes no depression.  Atrial flutter: Patient takes Eliquis and metoprolol.  She is been out of her metoprolol for about 5 days.  No palpitations.  No bleeding issues.    Social History   Tobacco Use  Smoking Status Current Every Day Smoker  . Packs/day: 0.50  . Years: 30.00  . Pack years: 15.00  . Types: Cigarettes  Smokeless Tobacco Never Used    Current Outpatient Medications on File Prior to Visit  Medication Sig Dispense Refill  . acetaminophen (TYLENOL) 500 MG tablet Take 1,000 mg by mouth every 6 (six) hours as needed for moderate pain.    . Adalimumab 40 MG/0.8ML PNKT Inject 40 mg into the skin every 14 (fourteen) days.    Marland Kitchen apixaban (ELIQUIS) 5 MG TABS tablet TAKE 1 TABLET(5 MG) BY MOUTH TWICE DAILY 60 tablet 1  . Calcium Carbonate (CALCIUM 600 PO) Take 600 mg by mouth every other day.     . cetirizine (ZYRTEC) 10 MG tablet Take 10 mg by mouth daily.    . Coenzyme Q-10 100 MG capsule Take 100 mg by mouth daily.    . Multiple Vitamin (MULTIVITAMIN) tablet Take 1 tablet by mouth daily.    . predniSONE (DELTASONE) 5 MG tablet Take 2 tablets (10 mg total) by mouth daily with breakfast. 2 tablets daily for 2 weeks then decrease to 1 tablet daily until seen by MD 45 tablet 1   No  current facility-administered medications on file prior to visit.     ROS see history of present illness  Objective  Physical Exam Vitals:   08/24/20 1428  BP: 140/80  Pulse: 65  Temp: 98.5 F (36.9 C)  SpO2: 99%    BP Readings from Last 3 Encounters:  08/24/20 140/80  10/16/19 128/72  08/29/19 112/60   Wt Readings from Last 3 Encounters:  08/24/20 112 lb (50.8 kg)  01/16/20 117 lb (53.1 kg)  10/16/19 117 lb 12.8 oz (53.4 kg)    Physical Exam Constitutional:      General: She is not in acute distress.    Appearance: She is not diaphoretic.  Cardiovascular:     Rate and Rhythm: Normal rate and regular rhythm.     Heart sounds: Normal heart sounds.  Pulmonary:     Effort: Pulmonary effort is normal.     Breath sounds: Normal breath sounds.  Musculoskeletal:     Right lower leg: No edema.     Left lower leg: No edema.  Skin:    General: Skin is warm and dry.  Neurological:     Mental Status: She is alert.      Assessment/Plan: Please see individual problem list.  Problem List Items Addressed This Visit  Adult hypothyroidism    Check TSH.  Continue Synthroid 50 mcg once daily.      Relevant Medications   levothyroxine (SYNTHROID) 50 MCG tablet   metoprolol tartrate (LOPRESSOR) 25 MG tablet   Other Relevant Orders   TSH   Anxiety    Patient has anxiety related to life stressors.  We will start on Zoloft 50 mg once daily.  Discussed side effects of weight gain, sleep issues, and sexual dysfunction.  If she notices those or any other side effects she will let us know.      Relevant Medications   sertraline (ZOLOFT) 50 MG tablet   Atrial flutter (HCC)    Sinus rhythm today.  She will continue metoprolol 25 mg twice daily and Eliquis 5 mg twice daily.      Relevant Medications   losartan-hydrochlorothiazide (HYZAAR) 50-12.5 MG tablet   metoprolol tartrate (LOPRESSOR) 25 MG tablet   Hypertension    Above goal.  We will have her take losartan-HCTZ 1  tablet daily.  Labs today.  She will also have labs in 2 weeks with a blood pressure check.      Relevant Medications   losartan-hydrochlorothiazide (HYZAAR) 50-12.5 MG tablet   metoprolol tartrate (LOPRESSOR) 25 MG tablet   Other Relevant Orders   Comp Met (CMET)   Lipid panel   Basic Metabolic Panel (BMET)    Other Visit Diagnoses    Hypothyroidism, unspecified type       Relevant Medications   levothyroxine (SYNTHROID) 50 MCG tablet   metoprolol tartrate (LOPRESSOR) 25 MG tablet   Essential hypertension       Relevant Medications   losartan-hydrochlorothiazide (HYZAAR) 50-12.5 MG tablet   metoprolol tartrate (LOPRESSOR) 25 MG tablet      This visit occurred during the SARS-CoV-2 public health emergency.  Safety protocols were in place, including screening questions prior to the visit, additional usage of staff PPE, and extensive cleaning of exam room while observing appropriate contact time as indicated for disinfecting solutions.    Tommi Rumps, MD [place]

## 2020-08-24 NOTE — Patient Instructions (Signed)
Nice to see you. We will get labs today. We are going to increase your losartan-HCTZ dose to 1 tablet once daily.  Please check your blood pressure consistently. We are starting you on Zoloft.  If you notice any of the side effects we discussed please let me know.

## 2020-08-24 NOTE — Assessment & Plan Note (Signed)
Sinus rhythm today.  She will continue metoprolol 25 mg twice daily and Eliquis 5 mg twice daily.

## 2020-08-24 NOTE — Assessment & Plan Note (Signed)
Patient has anxiety related to life stressors.  We will start on Zoloft 50 mg once daily.  Discussed side effects of weight gain, sleep issues, and sexual dysfunction.  If she notices those or any other side effects she will let us know.

## 2020-08-24 NOTE — Assessment & Plan Note (Signed)
Above goal.  We will have her take losartan-HCTZ 1 tablet daily.  Labs today.  She will also have labs in 2 weeks with a blood pressure check.

## 2020-08-24 NOTE — Assessment & Plan Note (Signed)
Check TSH.  Continue Synthroid 50 mcg once daily.

## 2020-08-25 LAB — COMPREHENSIVE METABOLIC PANEL
ALT: 17 U/L (ref 0–35)
AST: 23 U/L (ref 0–37)
Albumin: 4.5 g/dL (ref 3.5–5.2)
Alkaline Phosphatase: 66 U/L (ref 39–117)
BUN: 13 mg/dL (ref 6–23)
CO2: 28 mEq/L (ref 19–32)
Calcium: 10.1 mg/dL (ref 8.4–10.5)
Chloride: 93 mEq/L — ABNORMAL LOW (ref 96–112)
Creatinine, Ser: 1.12 mg/dL (ref 0.40–1.20)
GFR: 48.08 mL/min — ABNORMAL LOW (ref >60.00)
Glucose, Bld: 109 mg/dL — ABNORMAL HIGH (ref 70–99)
Potassium: 3.7 mEq/L (ref 3.5–5.1)
Sodium: 131 mEq/L — ABNORMAL LOW (ref 135–145)
Total Bilirubin: 0.6 mg/dL (ref 0.2–1.2)
Total Protein: 7.5 g/dL (ref 6.0–8.3)

## 2020-08-25 LAB — TSH: TSH: 2.63 u[IU]/mL (ref 0.35–4.50)

## 2020-08-25 LAB — LIPID PANEL
Cholesterol: 239 mg/dL — ABNORMAL HIGH (ref 0–200)
HDL: 45.5 mg/dL (ref >39.00)
LDL Cholesterol: 156 mg/dL — ABNORMAL HIGH (ref 0–99)
NonHDL: 193.22
Total CHOL/HDL Ratio: 5
Triglycerides: 185 mg/dL — ABNORMAL HIGH (ref 0.0–149.0)
VLDL: 37 mg/dL (ref 0.0–40.0)

## 2020-09-02 ENCOUNTER — Other Ambulatory Visit: Payer: Self-pay | Admitting: Family Medicine

## 2020-09-02 DIAGNOSIS — I10 Essential (primary) hypertension: Secondary | ICD-10-CM

## 2020-09-02 DIAGNOSIS — E785 Hyperlipidemia, unspecified: Secondary | ICD-10-CM

## 2020-09-02 MED ORDER — ROSUVASTATIN CALCIUM 10 MG PO TABS: 10.0000 mg | ORAL_TABLET | Freq: Every day | ORAL | 3 refills | Status: DC

## 2020-09-02 MED ORDER — LOSARTAN POTASSIUM 50 MG PO TABS: 50.0000 mg | ORAL_TABLET | Freq: Every day | ORAL | 3 refills | Status: DC

## 2020-09-03 DIAGNOSIS — I483 Typical atrial flutter: Secondary | ICD-10-CM | POA: Diagnosis not present

## 2020-09-03 DIAGNOSIS — D696 Thrombocytopenia, unspecified: Secondary | ICD-10-CM | POA: Diagnosis not present

## 2020-09-03 DIAGNOSIS — M059 Rheumatoid arthritis with rheumatoid factor, unspecified: Secondary | ICD-10-CM | POA: Diagnosis not present

## 2020-09-07 ENCOUNTER — Other Ambulatory Visit: Payer: Self-pay | Admitting: Family Medicine

## 2020-09-07 DIAGNOSIS — D696 Thrombocytopenia, unspecified: Secondary | ICD-10-CM

## 2020-09-11 ENCOUNTER — Other Ambulatory Visit: Payer: Medicare HMO

## 2020-09-30 ENCOUNTER — Other Ambulatory Visit: Payer: Self-pay

## 2020-10-05 ENCOUNTER — Encounter: Payer: Self-pay | Admitting: Family Medicine

## 2020-10-05 ENCOUNTER — Ambulatory Visit (INDEPENDENT_AMBULATORY_CARE_PROVIDER_SITE_OTHER): Payer: Medicare HMO | Admitting: Family Medicine

## 2020-10-05 ENCOUNTER — Other Ambulatory Visit: Payer: Self-pay

## 2020-10-05 DIAGNOSIS — F32A Depression, unspecified: Secondary | ICD-10-CM

## 2020-10-05 DIAGNOSIS — M069 Rheumatoid arthritis, unspecified: Secondary | ICD-10-CM | POA: Diagnosis not present

## 2020-10-05 DIAGNOSIS — I10 Essential (primary) hypertension: Secondary | ICD-10-CM

## 2020-10-05 DIAGNOSIS — F419 Anxiety disorder, unspecified: Secondary | ICD-10-CM | POA: Diagnosis not present

## 2020-10-05 NOTE — Assessment & Plan Note (Signed)
Adequately controlled on losartan 50 mg once daily.  She will continue this.  We will check a BMP given her prior hyponatremia.

## 2020-10-05 NOTE — Patient Instructions (Signed)
Nice to see you. Please start taking the Zoloft in the morning and take it with food.  If your nausea does not improve with this please let me know.

## 2020-10-05 NOTE — Assessment & Plan Note (Addendum)
Now on Plaquenil.  She will follow with rheumatology for this.  Discussed that she should not take ibuprofen with her Eliquis given risk of bleeding.  Advised that Tylenol will be the safest thing if she notes that not beneficial.  I encouraged her to discuss pain management with her rheumatologist.

## 2020-10-05 NOTE — Assessment & Plan Note (Signed)
The patient's symptoms have improved on the Zoloft.  I suspect she is having a side effect of nausea with the Zoloft.  I encouraged her to take Zoloft in the morning and take it with food to see if that is helpful.  If its not helpful she will let us know.  For now she will continue Zoloft 50 mg once daily.

## 2020-10-05 NOTE — Progress Notes (Signed)
Tommi Rumps, MD Phone: [phone]  Tamara Whitehead is a 75 y.o. female who presents today for f/u.  HYPERTENSION  Disease Monitoring  Home BP Monitoring 128-130/70s-80 Chest pain- no    Dyspnea- no Medications  Compliance-  Taking losartan.  Edema- no  Anxiety/depression: Patient notes these things have improved on the Zoloft.  She does still have some anxiety though it is mostly related to her current life situation and she feels as though it will improve as she resolves some of the difficulty she has been having.  She feels quite a bit better regarding nervousness and jitteriness.  She is dealing with things a lot better.  She does note some nausea that occurs right after she gets out of bed in the morning.  She does take her Zoloft at night.  Typically the nausea resolves within a couple of hours and improves with eating.  No vomiting.  No abdominal pain.  Rheumatoid arthritis: The patient notes she is now on Plaquenil.  She had a flareup of her arthritis recently.  She notes she remains on Humira.  She notes the flare was quite severe and she had to take ibuprofen.  Social History   Tobacco Use  Smoking Status Current Every Day Smoker  . Packs/day: 0.50  . Years: 30.00  . Pack years: 15.00  . Types: Cigarettes  Smokeless Tobacco Never Used    Current Outpatient Medications on File Prior to Visit  Medication Sig Dispense Refill  . acetaminophen (TYLENOL) 500 MG tablet Take 1,000 mg by mouth every 6 (six) hours as needed for moderate pain.    . Adalimumab 40 MG/0.8ML PNKT Inject 40 mg into the skin every 14 (fourteen) days.    . Calcium Carbonate (CALCIUM 600 PO) Take 600 mg by mouth every other day.     . cetirizine (ZYRTEC) 10 MG tablet Take 10 mg by mouth daily.    . Coenzyme Q-10 100 MG capsule Take 100 mg by mouth daily.    Marland Kitchen ELIQUIS 5 MG TABS tablet TAKE 1 TABLET TWICE DAILY 120 tablet 1  . hydroxychloroquine (PLAQUENIL) 200 MG tablet     . levothyroxine (SYNTHROID)  50 MCG tablet TAKE 1 TABLET(50 MCG) BY MOUTH DAILY BEFORE BREAKFAST 90 tablet 1  . losartan (COZAAR) 50 MG tablet Take 1 tablet (50 mg total) by mouth daily. 90 tablet 3  . metoprolol tartrate (LOPRESSOR) 25 MG tablet TAKE 1 TABLET(25 MG) BY MOUTH TWICE DAILY 180 tablet 3  . Multiple Vitamin (MULTIVITAMIN) tablet Take 1 tablet by mouth daily.    . rosuvastatin (CRESTOR) 10 MG tablet Take 1 tablet (10 mg total) by mouth daily. 90 tablet 3  . sertraline (ZOLOFT) 50 MG tablet Take 1 tablet (50 mg total) by mouth daily. 30 tablet 3  . predniSONE (DELTASONE) 5 MG tablet Take 2 tablets (10 mg total) by mouth daily with breakfast. 2 tablets daily for 2 weeks then decrease to 1 tablet daily until seen by MD (Patient not taking: Reported on 10/05/2020) 45 tablet 1   No current facility-administered medications on file prior to visit.     ROS see history of present illness  Objective  Physical Exam Vitals:   10/05/20 1444  BP: 118/70  Pulse: 84  Temp: 99.1 F (37.3 C)  SpO2: 97%    BP Readings from Last 3 Encounters:  10/05/20 118/70  08/24/20 140/80  10/16/19 128/72   Wt Readings from Last 3 Encounters:  10/05/20 112 lb 9.6 oz (51.1 kg)  08/24/20 112 lb (50.8 kg)  01/16/20 117 lb (53.1 kg)    Physical Exam Constitutional:      General: She is not in acute distress.    Appearance: She is not diaphoretic.  Cardiovascular:     Rate and Rhythm: Normal rate and regular rhythm.     Heart sounds: Normal heart sounds.  Pulmonary:     Effort: Pulmonary effort is normal.     Breath sounds: Normal breath sounds.  Skin:    General: Skin is warm and dry.  Neurological:     Mental Status: She is alert.      Assessment/Plan: Please see individual problem list.  Problem List Items Addressed This Visit    Rheumatoid arthritis ([place])    Now on Plaquenil.  She will follow with rheumatology for this.  Discussed that she should not take ibuprofen with her Eliquis given risk of bleeding.   Advised that Tylenol will be the safest thing if she notes that not beneficial.  I encouraged her to discuss pain management with her rheumatologist.      Relevant Medications   hydroxychloroquine (PLAQUENIL) 200 MG tablet   Hypertension    Adequately controlled on losartan 50 mg once daily.  She will continue this.  We will check a BMP given her prior hyponatremia.      Relevant Orders   Basic Metabolic Panel (BMET)   Anxiety and depression    The patient's symptoms have improved on the Zoloft.  I suspect she is having a side effect of nausea with the Zoloft.  I encouraged her to take Zoloft in the morning and take it with food to see if that is helpful.  If its not helpful she will let us know.  For now she will continue Zoloft 50 mg once daily.         Return in about 3 months (around 01/05/2021).  This visit occurred during the SARS-CoV-2 public health emergency.  Safety protocols were in place, including screening questions prior to the visit, additional usage of staff PPE, and extensive cleaning of exam room while observing appropriate contact time as indicated for disinfecting solutions.    Tommi Rumps, MD [place]

## 2020-10-06 LAB — BASIC METABOLIC PANEL
BUN: 16 mg/dL (ref 6–23)
CO2: 26 mEq/L (ref 19–32)
Calcium: 9.2 mg/dL (ref 8.4–10.5)
Chloride: 102 mEq/L (ref 96–112)
Creatinine, Ser: 1.29 mg/dL — ABNORMAL HIGH (ref 0.40–1.20)
GFR: 40.55 mL/min — ABNORMAL LOW (ref >60.00)
Glucose, Bld: 87 mg/dL (ref 70–99)
Potassium: 4 mEq/L (ref 3.5–5.1)
Sodium: 136 mEq/L (ref 135–145)

## 2020-11-20 ENCOUNTER — Other Ambulatory Visit: Payer: Self-pay | Admitting: Family Medicine

## 2020-11-20 DIAGNOSIS — D696 Thrombocytopenia, unspecified: Secondary | ICD-10-CM

## 2020-11-28 ENCOUNTER — Other Ambulatory Visit: Payer: Self-pay | Admitting: Family Medicine

## 2020-11-28 DIAGNOSIS — F419 Anxiety disorder, unspecified: Secondary | ICD-10-CM

## 2021-01-05 ENCOUNTER — Other Ambulatory Visit: Payer: Self-pay

## 2021-01-05 ENCOUNTER — Ambulatory Visit (INDEPENDENT_AMBULATORY_CARE_PROVIDER_SITE_OTHER): Payer: Medicare HMO | Admitting: Family Medicine

## 2021-01-05 DIAGNOSIS — F32A Depression, unspecified: Secondary | ICD-10-CM

## 2021-01-05 DIAGNOSIS — I10 Essential (primary) hypertension: Secondary | ICD-10-CM | POA: Diagnosis not present

## 2021-01-05 DIAGNOSIS — F419 Anxiety disorder, unspecified: Secondary | ICD-10-CM

## 2021-01-05 DIAGNOSIS — I4892 Unspecified atrial flutter: Secondary | ICD-10-CM

## 2021-01-05 MED ORDER — SERTRALINE HCL 50 MG PO TABS: 100.0000 mg | ORAL_TABLET | Freq: Every day | ORAL | 3 refills | Status: DC

## 2021-01-05 NOTE — Patient Instructions (Signed)
Nice to see you. Please try taking 100 mg of your Zoloft once daily.  If you are unable to tolerate this please let us know.

## 2021-01-05 NOTE — Progress Notes (Signed)
Tamara Rumps, MD Phone: [phone]  Tamara Whitehead is a 75 y.o. female who presents today for follow-up.  Atrial flutter: Taking Eliquis.  No palpitations.  No bleeding issues.  Hypertension: Typically 130-140/70.  Taking losartan metoprolol.  No chest pain, shortness of breath, or edema.  Anxiety/depression: Patient notes her anxiety has become more manageable.  Stressors are still lingering and will not they do cause some anxiety.  She notes some depression at times that comes and goes related to the stressors.  She does note at times in the past having had some passive suicidal ideation though no active suicidal ideation, plan, or intent to commit suicide.  She would not want to put her family through that.  She also notes some trouble sleeping and melatonin seems to help with that.  She has been on Zoloft 50 mg once daily.  The nausea she was having previously resolved.  Social History   Tobacco Use  Smoking Status Every Day   Packs/day: 0.50   Years: 30.00   Pack years: 15.00   Types: Cigarettes  Smokeless Tobacco Never    Current Outpatient Medications on File Prior to Visit  Medication Sig Dispense Refill   acetaminophen (TYLENOL) 500 MG tablet Take 1,000 mg by mouth every 6 (six) hours as needed for moderate pain.     Adalimumab 40 MG/0.8ML PNKT Inject 40 mg into the skin every 14 (fourteen) days.     Calcium Carbonate (CALCIUM 600 PO) Take 600 mg by mouth every other day.      cetirizine (ZYRTEC) 10 MG tablet Take 10 mg by mouth daily.     Coenzyme Q-10 100 MG capsule Take 100 mg by mouth daily.     ELIQUIS 5 MG TABS tablet TAKE 1 TABLET TWICE DAILY 180 tablet 1   hydroxychloroquine (PLAQUENIL) 200 MG tablet      levothyroxine (SYNTHROID) 50 MCG tablet TAKE 1 TABLET(50 MCG) BY MOUTH DAILY BEFORE BREAKFAST 90 tablet 1   losartan (COZAAR) 50 MG tablet Take 1 tablet (50 mg total) by mouth daily. 90 tablet 3   metoprolol tartrate (LOPRESSOR) 25 MG tablet TAKE 1 TABLET(25  MG) BY MOUTH TWICE DAILY 180 tablet 3   Multiple Vitamin (MULTIVITAMIN) tablet Take 1 tablet by mouth daily.     rosuvastatin (CRESTOR) 10 MG tablet Take 1 tablet (10 mg total) by mouth daily. 90 tablet 3   No current facility-administered medications on file prior to visit.     ROS see history of present illness  Objective  Physical Exam Vitals:   01/05/21 1103  BP: 130/70  Pulse: 63  Temp: 98.6 F (37 C)  SpO2: 99%    BP Readings from Last 3 Encounters:  01/05/21 130/70  10/05/20 118/70  08/24/20 140/80   Wt Readings from Last 3 Encounters:  01/05/21 114 lb 3.2 oz (51.8 kg)  10/05/20 112 lb 9.6 oz (51.1 kg)  08/24/20 112 lb (50.8 kg)    Physical Exam Constitutional:      General: She is not in acute distress.    Appearance: She is not diaphoretic.  Cardiovascular:     Rate and Rhythm: Normal rate and regular rhythm.     Heart sounds: Normal heart sounds.  Pulmonary:     Effort: Pulmonary effort is normal.     Breath sounds: Normal breath sounds.  Musculoskeletal:     Right lower leg: No edema.     Left lower leg: No edema.  Skin:    General: Skin is  warm and dry.  Neurological:     Mental Status: She is alert.     Assessment/Plan: Please see individual problem list.  Problem List Items Addressed This Visit     Anxiety and depression    Continues to have some symptoms.  We will have her trial of Zoloft 100 mg once daily.  If she is able to tolerate this she will let us know and I will refill at that dosing.  Discussed safety precautions regarding suicidal ideation and if she ever develops intent or plan to harm herself she needs to be evaluated in the emergency department.  Encouraged to take the melatonin 1 hour before bedtime.  Discussed that her sleep may improve as her anxiety and depression improves.      Relevant Medications   sertraline (ZOLOFT) 50 MG tablet   Atrial flutter (HCC)    Sinus rhythm today.  She will continue Eliquis 5 mg twice  daily.      Hypertension    Adequate control for age.  She will continue losartan 50 mg once daily and metoprolol 25 mg twice daily.      Other Visit Diagnoses     Anxiety       Relevant Medications   sertraline (ZOLOFT) 50 MG tablet       Return in about 3 months (around 04/07/2021) for Anxiety.  This visit occurred during the SARS-CoV-2 public health emergency.  Safety protocols were in place, including screening questions prior to the visit, additional usage of staff PPE, and extensive cleaning of exam room while observing appropriate contact time as indicated for disinfecting solutions.    Tamara Rumps, MD Tamara Whitehead

## 2021-01-05 NOTE — Assessment & Plan Note (Signed)
Sinus rhythm today.  She will continue Eliquis 5 mg twice daily.

## 2021-01-05 NOTE — Assessment & Plan Note (Signed)
Adequate control for age.  She will continue losartan 50 mg once daily and metoprolol 25 mg twice daily.

## 2021-01-05 NOTE — Assessment & Plan Note (Addendum)
Continues to have some symptoms.  We will have her trial of Zoloft 100 mg once daily.  If she is able to tolerate this she will let us know and I will refill at that dosing.  Discussed safety precautions regarding suicidal ideation and if she ever develops intent or plan to harm herself she needs to be evaluated in the emergency department.  Encouraged to take the melatonin 1 hour before bedtime.  Discussed that her sleep may improve as her anxiety and depression improves.

## 2021-01-12 ENCOUNTER — Telehealth: Payer: Self-pay | Admitting: Family Medicine

## 2021-01-12 DIAGNOSIS — I4892 Unspecified atrial flutter: Secondary | ICD-10-CM

## 2021-01-12 DIAGNOSIS — F419 Anxiety disorder, unspecified: Secondary | ICD-10-CM

## 2021-01-12 DIAGNOSIS — I10 Essential (primary) hypertension: Secondary | ICD-10-CM

## 2021-01-12 DIAGNOSIS — E785 Hyperlipidemia, unspecified: Secondary | ICD-10-CM

## 2021-01-12 DIAGNOSIS — E039 Hypothyroidism, unspecified: Secondary | ICD-10-CM

## 2021-01-12 MED ORDER — LEVOTHYROXINE SODIUM 50 MCG PO TABS: ORAL_TABLET | ORAL | 1 refills | Status: DC

## 2021-01-12 MED ORDER — METOPROLOL TARTRATE 25 MG PO TABS: ORAL_TABLET | ORAL | 3 refills | Status: DC

## 2021-01-12 MED ORDER — SERTRALINE HCL 50 MG PO TABS: 100.0000 mg | ORAL_TABLET | Freq: Every day | ORAL | 3 refills | Status: DC

## 2021-01-12 MED ORDER — LOSARTAN POTASSIUM 50 MG PO TABS: 50.0000 mg | ORAL_TABLET | Freq: Every day | ORAL | 3 refills | Status: DC

## 2021-01-12 MED ORDER — ROSUVASTATIN CALCIUM 10 MG PO TABS: 10.0000 mg | ORAL_TABLET | Freq: Every day | ORAL | 3 refills | Status: DC

## 2021-01-12 NOTE — Telephone Encounter (Signed)
Refills sent to pharmacy. 

## 2021-01-12 NOTE — Addendum Note (Signed)
Addended by: Nanci Pina on: 01/12/2021 04:28 PM   Modules accepted: Orders

## 2021-01-12 NOTE — Telephone Encounter (Signed)
Pharmacy called to request the following scripts to be called in:  losartan (COZAAR) 50 MG tablet rosuvastatin (CRESTOR) 10 MG tablet levothyroxine (SYNTHROID) 50 MCG tablet metoprolol tartrate (LOPRESSOR) 25 MG tablet sertraline (ZOLOFT) 50 MG tablet

## 2021-01-18 ENCOUNTER — Ambulatory Visit (INDEPENDENT_AMBULATORY_CARE_PROVIDER_SITE_OTHER): Payer: Medicare HMO

## 2021-01-18 ENCOUNTER — Telehealth: Payer: Self-pay

## 2021-01-18 VITALS — Ht 61.0 in | Wt 114.0 lb

## 2021-01-18 DIAGNOSIS — Z0000 Encounter for general adult medical examination without abnormal findings: Secondary | ICD-10-CM | POA: Diagnosis not present

## 2021-01-18 DIAGNOSIS — F419 Anxiety disorder, unspecified: Secondary | ICD-10-CM

## 2021-01-18 NOTE — Telephone Encounter (Signed)
Requests local Rx for zoloft and mail order Rx. States she called mail order [facility]) and it has not yet been received. She has not been taking for 1 week due to no refill. All other Rx have been received.  Blen Ransome,cma

## 2021-01-18 NOTE — Patient Instructions (Addendum)
Tamara Whitehead , Thank you for taking time to come for your Medicare Wellness Visit. I appreciate your ongoing commitment to your health goals. Please review the following plan we discussed and let me know if I can assist you in the future.   These are the goals we discussed:  Goals       Patient Stated     Follow up with Primary Care Provider (pt-stated)      Keep all routine appointments         This is a list of the screening recommended for you and due dates:  Health Maintenance  Topic Date Due   Flu Shot  08/20/2021*   DEXA scan (bone density measurement)  01/18/2022*   Cologuard (Stool DNA test)  03/24/2022   Hepatitis C Screening: USPSTF Recommendation to screen - Ages 80-79 yo.  Completed   Pneumonia vaccines  Completed   HPV Vaccine  Aged Out   Tetanus Vaccine  Discontinued   COVID-19 Vaccine  Discontinued   Zoster (Shingles) Vaccine  Discontinued  *Topic was postponed. The date shown is not the original due date.    Advanced directives: not yet completed  Follow up in one year for your annual wellness visit    Preventive Care 65 Years and Older, Female Preventive care refers to lifestyle choices and visits with your health care provider that can promote health and wellness. What does preventive care include? A yearly physical exam. This is also called an annual well check. Dental exams once or twice a year. Routine eye exams. Ask your health care provider how often you should have your eyes checked. Personal lifestyle choices, including: Daily care of your teeth and gums. Regular physical activity. Eating a healthy diet. Avoiding tobacco and drug use. Limiting alcohol use. Practicing safe sex. Taking low-dose aspirin every day. Taking vitamin and mineral supplements as recommended by your health care provider. What happens during an annual well check? The services and screenings done by your health care provider during your annual well check will depend on  your age, overall health, lifestyle risk factors, and family history of disease. Counseling  Your health care provider may ask you questions about your: Alcohol use. Tobacco use. Drug use. Emotional well-being. Home and relationship well-being. Sexual activity. Eating habits. History of falls. Memory and ability to understand (cognition). Work and work Statistician. Reproductive health. Screening  You may have the following tests or measurements: Height, weight, and BMI. Blood pressure. Lipid and cholesterol levels. These may be checked every 5 years, or more frequently if you are over 17 years old. Skin check. Lung cancer screening. You may have this screening every year starting at age 67 if you have a 30-pack-year history of smoking and currently smoke or have quit within the past 15 years. Fecal occult blood test (FOBT) of the stool. You may have this test every year starting at age 13. Flexible sigmoidoscopy or colonoscopy. You may have a sigmoidoscopy every 5 years or a colonoscopy every 10 years starting at age 4. Hepatitis C blood test. Hepatitis B blood test. Sexually transmitted disease (STD) testing. Diabetes screening. This is done by checking your blood sugar (glucose) after you have not eaten for a while (fasting). You may have this done every 1-3 years. Bone density scan. This is done to screen for osteoporosis. You may have this done starting at age 36. Mammogram. This may be done every 1-2 years. Talk to your health care provider about how often you should have  regular mammograms. Talk with your health care provider about your test results, treatment options, and if necessary, the need for more tests. Vaccines  Your health care provider may recommend certain vaccines, such as: Influenza vaccine. This is recommended every year. Tetanus, diphtheria, and acellular pertussis (Tdap, Td) vaccine. You may need a Td booster every 10 years. Zoster vaccine. You may need this  after age 42. Pneumococcal 13-valent conjugate (PCV13) vaccine. One dose is recommended after age 4. Pneumococcal polysaccharide (PPSV23) vaccine. One dose is recommended after age 42. Talk to your health care provider about which screenings and vaccines you need and how often you need them. This information is not intended to replace advice given to you by your health care provider. Make sure you discuss any questions you have with your health care provider. Document Released: 06/05/2015 Document Revised: 01/27/2016 Document Reviewed: 03/10/2015 Elsevier Interactive Patient Education  2017 [place] Prevention in the Home Falls can cause injuries. They can happen to people of all ages. There are many things you can do to make your home safe and to help prevent falls. What can I do on the outside of my home? Regularly fix the edges of walkways and driveways and fix any cracks. Remove anything that might make you trip as you walk through a door, such as a raised step or threshold. Trim any bushes or trees on the path to your home. Use bright outdoor lighting. Clear any walking paths of anything that might make someone trip, such as rocks or tools. Regularly check to see if handrails are loose or broken. Make sure that both sides of any steps have handrails. Any raised decks and porches should have guardrails on the edges. Have any leaves, snow, or ice cleared regularly. Use sand or salt on walking paths during winter. Clean up any spills in your garage right away. This includes oil or grease spills. What can I do in the bathroom? Use night lights. Install grab bars by the toilet and in the tub and shower. Do not use towel bars as grab bars. Use non-skid mats or decals in the tub or shower. If you need to sit down in the shower, use a plastic, non-slip stool. Keep the floor dry. Clean up any water that spills on the floor as soon as it happens. Remove soap buildup in the tub or  shower regularly. Attach bath mats securely with double-sided non-slip rug tape. Do not have throw rugs and other things on the floor that can make you trip. What can I do in the bedroom? Use night lights. Make sure that you have a light by your bed that is easy to reach. Do not use any sheets or blankets that are too big for your bed. They should not hang down onto the floor. Have a firm chair that has side arms. You can use this for support while you get dressed. Do not have throw rugs and other things on the floor that can make you trip. What can I do in the kitchen? Clean up any spills right away. Avoid walking on wet floors. Keep items that you use a lot in easy-to-reach places. If you need to reach something above you, use a strong step stool that has a grab bar. Keep electrical cords out of the way. Do not use floor polish or wax that makes floors slippery. If you must use wax, use non-skid floor wax. Do not have throw rugs and other things on the floor that  can make you trip. What can I do with my stairs? Do not leave any items on the stairs. Make sure that there are handrails on both sides of the stairs and use them. Fix handrails that are broken or loose. Make sure that handrails are as long as the stairways. Check any carpeting to make sure that it is firmly attached to the stairs. Fix any carpet that is loose or worn. Avoid having throw rugs at the top or bottom of the stairs. If you do have throw rugs, attach them to the floor with carpet tape. Make sure that you have a light switch at the top of the stairs and the bottom of the stairs. If you do not have them, ask someone to add them for you. What else can I do to help prevent falls? Wear shoes that: Do not have high heels. Have rubber bottoms. Are comfortable and fit you well. Are closed at the toe. Do not wear sandals. If you use a stepladder: Make sure that it is fully opened. Do not climb a closed stepladder. Make  sure that both sides of the stepladder are locked into place. Ask someone to hold it for you, if possible. Clearly mark and make sure that you can see: Any grab bars or handrails. First and last steps. Where the edge of each step is. Use tools that help you move around (mobility aids) if they are needed. These include: Canes. Walkers. Scooters. Crutches. Turn on the lights when you go into a dark area. Replace any light bulbs as soon as they burn out. Set up your furniture so you have a clear path. Avoid moving your furniture around. If any of your floors are uneven, fix them. If there are any pets around you, be aware of where they are. Review your medicines with your doctor. Some medicines can make you feel dizzy. This can increase your chance of falling. Ask your doctor what other things that you can do to help prevent falls. This information is not intended to replace advice given to you by your health care provider. Make sure you discuss any questions you have with your health care provider. Document Released: 03/05/2009 Document Revised: 10/15/2015 Document Reviewed: 06/13/2014 Elsevier Interactive Patient Education  2017 [employer].

## 2021-01-18 NOTE — Progress Notes (Signed)
Subjective:   Tamara Whitehead is a 75 y.o. female who presents for Medicare Annual (Subsequent) preventive examination.  Review of Systems    No ROS.  Medicare Wellness Virtual Visit.  Visual/audio telehealth visit, UTA vital signs.   See social history for additional risk factors.   Cardiac Risk Factors include: advanced age (>20men, >70 women);hypertension     Objective:    Today's Vitals   01/18/21 0904  Weight: 114 lb (51.7 kg)  Height: 5\' 1"  (1.549 m)   Body mass index is 21.54 kg/m.  Advanced Directives 01/18/2021 01/16/2020 08/20/2019 07/23/2019 07/17/2019 07/12/2019 07/04/2019  Does Patient Have a Medical Advance Directive? No No No Yes No No Yes  Type of Advance Directive - - - - Living will - Living will  Does patient want to make changes to medical advance directive? - - No - Patient declined - No - Patient declined No - Patient declined No - Patient declined  Copy of [place] in Chart? - - - - - - -  Would patient like information on creating a medical advance directive? No - Patient declined No - Patient declined - - No - Patient declined - -    Current Medications (verified) Outpatient Encounter Medications as of 01/18/2021  Medication Sig   acetaminophen (TYLENOL) 500 MG tablet Take 1,000 mg by mouth every 6 (six) hours as needed for moderate pain.   Adalimumab 40 MG/0.8ML PNKT Inject 40 mg into the skin every 14 (fourteen) days.   Calcium Carbonate (CALCIUM 600 PO) Take 600 mg by mouth every other day.    cetirizine (ZYRTEC) 10 MG tablet Take 10 mg by mouth daily.   Coenzyme Q-10 100 MG capsule Take 100 mg by mouth daily.   ELIQUIS 5 MG TABS tablet TAKE 1 TABLET TWICE DAILY   hydroxychloroquine (PLAQUENIL) 200 MG tablet    levothyroxine (SYNTHROID) 50 MCG tablet TAKE 1 TABLET(50 MCG) BY MOUTH DAILY BEFORE BREAKFAST   losartan (COZAAR) 50 MG tablet Take 1 tablet (50 mg total) by mouth daily.   metoprolol tartrate (LOPRESSOR) 25 MG tablet TAKE 1  TABLET(25 MG) BY MOUTH TWICE DAILY   Multiple Vitamin (MULTIVITAMIN) tablet Take 1 tablet by mouth daily.   rosuvastatin (CRESTOR) 10 MG tablet Take 1 tablet (10 mg total) by mouth daily.   sertraline (ZOLOFT) 50 MG tablet Take 2 tablets (100 mg total) by mouth daily.   No facility-administered encounter medications on file as of 01/18/2021.    Allergies (verified) Codeine and Diltiazem   History: Past Medical History:  Diagnosis Date   Anxiety    Arthritis    Osteoarthritis/Rheumatoid   Atrial flutter ([place])    Cancer ([place])    Skin   Cataract    Community acquired pneumonia 04/04/2019   Fibrocystic breast disease    Glaucoma    Hypercholesterolemia    Hypertension    Hypothyroidism    Rheumatoid arthritis ([place])    Thyroid disease    Hypothyroid   Past Surgical History:  Procedure Laterality Date   ABDOMINAL HYSTERECTOMY     Total abd. hysterectomu and bilateral salping-oophorectomy   EYE SURGERY Right    Glaucoma surgery and cateract   HEMORRHOID SURGERY     status post dilatation and curettage  1975   TONSILLECTOMY     TONSILLECTOMY     TUBAL LIGATION     VIDEO BRONCHOSCOPY WITH ENDOBRONCHIAL NAVIGATION N/A 07/17/2019   Procedure: VIDEO BRONCHOSCOPY WITH ENDOBRONCHIAL NAVIGATION;  Surgeon: Vernard Gambles  L, MD;  Location: [facility] ORS;  Service: Pulmonary;  Laterality: N/A;   Family History  Problem Relation Age of Onset   CAD Mother    Arthritis Mother        Rheumatoid Arthritis   Heart failure Mother    Arthritis Father        Rheumatoid/Osteoarthritis and Blindness, bleeding Tendencies,Glaucoma   CAD Father    Heart failure Father    Hypertension Maternal Grandmother    COPD Neg Hx    Social History   Socioeconomic History   Marital status: Divorced    Spouse name: Not on file   Number of children: Not on file   Years of education: Not on file   Highest education level: Not on file  Occupational History   Not on file  Tobacco Use   Smoking status:  Every Day    Packs/day: 0.50    Years: 30.00    Pack years: 15.00    Types: Cigarettes   Smokeless tobacco: Never  Vaping Use   Vaping Use: Never used  Substance and Sexual Activity   Alcohol use: Yes    Comment: rarely   Drug use: No   Sexual activity: Not on file  Other Topics Concern   Not on file  Social History Narrative   Not on file   Social Determinants of Health   Financial Resource Strain: Low Risk    Difficulty of Paying Living Expenses: Not very hard  Food Insecurity: No Food Insecurity   Worried About Running Out of Food in the Last Year: Never true   Ran Out of Food in the Last Year: Never true  Transportation Needs: No Transportation Needs   Lack of Transportation (Medical): No   Lack of Transportation (Non-Medical): No  Physical Activity: Sufficiently Active   Days of Exercise per Week: 5 days   Minutes of Exercise per Session: 30 min  Stress: No Stress Concern Present   Feeling of Stress : Not at all  Social Connections: Unknown   Frequency of Communication with Friends and Family: Not on file   Frequency of Social Gatherings with Friends and Family: More than three times a week   Attends Religious Services: Not on Electrical engineer or Organizations: Not on file   Attends Archivist Meetings: Not on file   Marital Status: Not on file    Tobacco Counseling Ready to quit: Not Answered Counseling given: Not Answered   Clinical Intake:  Pre-visit preparation completed: Yes        Diabetes: No  How often do you need to have someone help you when you read instructions, pamphlets, or other written materials from your doctor or pharmacy?: 1 - Never  Interpreter Needed?: No      Activities of Daily Living In your present state of health, do you have any difficulty performing the following activities: 01/18/2021  Hearing? N  Vision? N  Difficulty concentrating or making decisions? N  Walking or climbing stairs? N   Dressing or bathing? N  Doing errands, shopping? N  Preparing Food and eating ? N  Using the Toilet? N  In the past six months, have you accidently leaked urine? Y  Comment Managed with daily pad  Do you have problems with loss of bowel control? N  Managing your Medications? N  Managing your Finances? N  Housekeeping or managing your Housekeeping? N  Some recent data might be hidden    Patient Care Team:  Leone Haven, MD as PCP - General (Family Medicine) Telford Nab, RN as Oncology Nurse Navigator Grayland Ormond, Kathlene November, MD as Consulting Physician (Oncology)  Indicate any recent Medical Services you may have received from other than [facility] providers in the past year (date may be approximate).     Assessment:   This is a routine wellness examination for [place].  I connected with Lilyth today by telephone and verified that I am speaking with the correct person using two identifiers. Location patient: home Location provider: work Persons participating in the virtual visit: patient, Marine scientist.    I discussed the limitations, risks, security and privacy concerns of performing an evaluation and management service by telephone and the availability of in person appointments. The patient expressed understanding and verbally consented to this telephonic visit.    Interactive audio and video telecommunications were attempted between this provider and patient, however failed, due to patient having technical difficulties OR patient did not have access to video capability.  We continued and completed visit with audio only.  Some vital signs may be absent or patient reported.   Hearing/Vision screen Hearing Screening - Comments:: Patient is able to hear conversational tones without difficulty.  No issues reported. Vision Screening - Comments:: Followed by [facility]  Wears reading glasses  Cataract extraction, R eye only  Glaucoma; drops in use  They have regular follow up with the  ophthalmologist  Dietary issues and exercise activities discussed: Current Exercise Habits: Home exercise routine, Intensity: Mild Healthy diet Low salt/low cholesterol Good water intake   Goals Addressed               This Visit's Progress     Patient Stated     Follow up with Primary Care Provider (pt-stated)        Keep all routine appointments        Depression Screen [facility] 2/9 Scores 01/18/2021 08/24/2020 01/16/2020 06/20/2019 05/03/2019 04/04/2019 01/15/2019  PHQ - 2 Score 0 0 0 0 0 0 1  PHQ- 9 Score - - - - - - -    Fall Risk Fall Risk  01/18/2021 08/24/2020 01/16/2020 06/20/2019 05/03/2019  Falls in the past year? 0 0 0 0 0  Number falls in past yr: - 0 0 - 0  Follow up Falls evaluation completed Falls evaluation completed Falls evaluation completed Falls evaluation completed Falls evaluation completed    [place]: Adequate lighting in your home to reduce risk of falls? Yes   ASSISTIVE DEVICES UTILIZED TO PREVENT FALLS: Life alert? No  Use of a cane, walker or w/c? Yes, as needed Elevated toilet seat or a handicapped toilet? Yes   TIMED UP AND GO: Was the test performed? No .   Cognitive Function: MMSE - Mini Mental State Exam 01/10/2018  Orientation to time 5  Orientation to Place 5  Registration 3  Attention/ Calculation 5  Recall 3  Language- name 2 objects 2  Language- repeat 1  Language- follow 3 step command 3  Language- read & follow direction 1  Write a sentence 1  Copy design 1  Total score 30     6CIT Screen 01/18/2021 01/15/2019  What Year? 0 points 0 points  What month? 0 points 0 points  What time? 0 points 0 points  Count back from 20 0 points 0 points  Months in reverse 0 points 0 points  Repeat phrase - 0 points  Total Score - 0  Immunizations Immunization History  Administered Date(s) Administered   H1N1 04/07/2008   Influenza Inj Mdck Quad Pf 06/04/2019   Influenza Split 05/28/2010   PPD Test  05/11/2015   Pneumococcal Conjugate-13 09/29/2014   Pneumococcal Polysaccharide-23 10/07/2011   Pneumococcal-Unspecified 10/07/2011   Health Maintenance Health Maintenance  Topic Date Due   INFLUENZA VACCINE  08/20/2021 (Originally 12/21/2020)   DEXA SCAN  01/18/2022 (Originally 01/24/2010)   Fecal DNA (Cologuard)  03/24/2022   Hepatitis C Screening  Completed   PNA vac Low Risk Adult  Completed   HPV VACCINES  Aged Out   TETANUS/TDAP  Discontinued   COVID-19 Vaccine  Discontinued   Zoster Vaccines- Shingrix  Discontinued   Mammogram- declined per patient  Dexa Scan- declined per patient  HM vaccines- discontinued per patient  Vision Screening: Recommended annual ophthalmology exams for early detection of glaucoma and other disorders of the eye.  Dental Screening: Recommended annual dental exams for proper oral hygiene  Community Resource Referral / Chronic Care Management: CRR required this visit?  No   CCM required this visit?  No      Plan:   Keep all routine maintenance appointments.   I have personally reviewed and noted the following in the patient's chart:   Medical and social history Use of alcohol, tobacco or illicit drugs  Current medications and supplements including opioid prescriptions. No opioid intake.  Functional ability and status Nutritional status Physical activity Advanced directives List of other physicians Hospitalizations, surgeries, and ER visits in previous 12 months Vitals Screenings to include cognitive, depression, and falls Referrals and appointments  In addition, I have reviewed and discussed with patient certain preventive protocols, quality metrics, and best practice recommendations. A written personalized care plan for preventive services as well as general preventive health recommendations were provided to patient via mychart.     Varney Biles, LPN   [phone]

## 2021-01-18 NOTE — Telephone Encounter (Signed)
Requests local Rx for zoloft and mail order Rx. States she called mail order [facility]) and it has not yet been received. She has not been taking for 1 week due to no refill. All other Rx have been received.

## 2021-01-19 MED ORDER — SERTRALINE HCL 100 MG PO TABS: 100.0000 mg | ORAL_TABLET | Freq: Every day | ORAL | 0 refills | Status: DC

## 2021-01-19 MED ORDER — SERTRALINE HCL 50 MG PO TABS: 100.0000 mg | ORAL_TABLET | Freq: Every day | ORAL | 3 refills | Status: DC

## 2021-01-19 NOTE — Telephone Encounter (Signed)
The Zoloft was sent to the pharmacy today.  I sent the 50 mg tablet prescription with taking 2 of those to the mail-order.  I sent 100 mg tablet to the local pharmacy for her to take 1 tablet daily.  These are the same doses though she needs to be clear on taking only 1 tablet daily of the 30-day supply that was sent to her local pharmacy.  Please go over that with her.

## 2021-01-20 NOTE — Telephone Encounter (Signed)
I called and spoke with the patient and informed her all about her prescriptions for Zoloft and she understood.  Breean Nannini,cma

## 2021-01-20 NOTE — Telephone Encounter (Signed)
VM not set up yet.  Jedi Catalfamo,cma

## 2021-02-25 ENCOUNTER — Other Ambulatory Visit: Payer: Self-pay | Admitting: Family Medicine

## 2021-03-03 ENCOUNTER — Other Ambulatory Visit: Payer: Self-pay | Admitting: Family Medicine

## 2021-03-03 DIAGNOSIS — E039 Hypothyroidism, unspecified: Secondary | ICD-10-CM

## 2021-04-07 ENCOUNTER — Ambulatory Visit: Payer: Medicare HMO | Admitting: Family Medicine

## 2021-06-28 ENCOUNTER — Other Ambulatory Visit: Payer: Self-pay | Admitting: Family Medicine

## 2021-06-28 DIAGNOSIS — E039 Hypothyroidism, unspecified: Secondary | ICD-10-CM

## 2021-06-28 DIAGNOSIS — D696 Thrombocytopenia, unspecified: Secondary | ICD-10-CM

## 2021-07-21 DIAGNOSIS — I4891 Unspecified atrial fibrillation: Secondary | ICD-10-CM | POA: Diagnosis not present

## 2021-07-21 DIAGNOSIS — D696 Thrombocytopenia, unspecified: Secondary | ICD-10-CM | POA: Diagnosis not present

## 2021-07-21 DIAGNOSIS — M059 Rheumatoid arthritis with rheumatoid factor, unspecified: Secondary | ICD-10-CM | POA: Diagnosis not present

## 2021-09-10 ENCOUNTER — Other Ambulatory Visit: Payer: Self-pay | Admitting: Family Medicine

## 2021-09-10 DIAGNOSIS — I4892 Unspecified atrial flutter: Secondary | ICD-10-CM

## 2021-09-10 DIAGNOSIS — F419 Anxiety disorder, unspecified: Secondary | ICD-10-CM

## 2021-10-27 ENCOUNTER — Other Ambulatory Visit: Payer: Self-pay | Admitting: Family Medicine

## 2021-10-27 DIAGNOSIS — I10 Essential (primary) hypertension: Secondary | ICD-10-CM

## 2021-10-27 DIAGNOSIS — E785 Hyperlipidemia, unspecified: Secondary | ICD-10-CM

## 2021-10-27 NOTE — Telephone Encounter (Signed)
I called patient to try to get her scheduled for follow-up. She has not been seen since 8/22. Pt had no VM unable to LM. Will refills for 30 day with note to pharmacy that  patient needs appointment.

## 2022-01-08 ENCOUNTER — Other Ambulatory Visit: Payer: Self-pay | Admitting: Family Medicine

## 2022-01-08 DIAGNOSIS — I4892 Unspecified atrial flutter: Secondary | ICD-10-CM

## 2022-01-08 DIAGNOSIS — E785 Hyperlipidemia, unspecified: Secondary | ICD-10-CM

## 2022-01-08 DIAGNOSIS — I10 Essential (primary) hypertension: Secondary | ICD-10-CM

## 2022-01-08 DIAGNOSIS — F419 Anxiety disorder, unspecified: Secondary | ICD-10-CM

## 2022-01-19 ENCOUNTER — Ambulatory Visit (INDEPENDENT_AMBULATORY_CARE_PROVIDER_SITE_OTHER): Payer: Medicare HMO

## 2022-01-19 ENCOUNTER — Ambulatory Visit: Payer: Medicare HMO

## 2022-01-19 VITALS — Ht 61.0 in | Wt 114.0 lb

## 2022-01-19 DIAGNOSIS — Z0000 Encounter for general adult medical examination without abnormal findings: Secondary | ICD-10-CM | POA: Diagnosis not present

## 2022-01-19 NOTE — Progress Notes (Signed)
Subjective:   Tamara Whitehead is a 76 y.o. female who presents for Medicare Annual (Subsequent) preventive examination.  Review of Systems    No ROS.  Medicare Wellness Virtual Visit.  Visual/audio telehealth visit, UTA vital signs.   See social history for additional risk factors.   Cardiac Risk Factors include: advanced age (>60men, >74 women);hypertension     Objective:    Today's Vitals   01/19/22 1515  Weight: 114 lb (51.7 kg)  Height: 5\' 1"  (1.549 m)   Body mass index is 21.54 kg/m.     01/19/2022    3:30 PM 01/18/2021    9:32 AM 01/16/2020    9:31 AM 08/20/2019   10:28 AM 07/23/2019   10:25 AM 07/17/2019   12:28 PM 07/12/2019   10:27 AM  Advanced Directives  Does Patient Have a Medical Advance Directive? Yes No No No Yes No No  Type of Advance Directive Living will     Living will   Does patient want to make changes to medical advance directive?    No - Patient declined  No - Patient declined No - Patient declined  Would patient like information on creating a medical advance directive?  No - Patient declined No - Patient declined   No - Patient declined     Current Medications (verified) Outpatient Encounter Medications as of 01/19/2022  Medication Sig   acetaminophen (TYLENOL) 500 MG tablet Take 1,000 mg by mouth every 6 (six) hours as needed for moderate pain.   Adalimumab 40 MG/0.8ML PNKT Inject 40 mg into the skin every 14 (fourteen) days.   Calcium Carbonate (CALCIUM 600 PO) Take 600 mg by mouth every other day.    cetirizine (ZYRTEC) 10 MG tablet Take 10 mg by mouth daily.   Coenzyme Q-10 100 MG capsule Take 100 mg by mouth daily.   ELIQUIS 5 MG TABS tablet TAKE 1 TABLET TWICE DAILY   hydroxychloroquine (PLAQUENIL) 200 MG tablet    levothyroxine (SYNTHROID) 50 MCG tablet TAKE 1 TABLET EVERY DAY BEFORE BREAKFAST   losartan (COZAAR) 50 MG tablet TAKE 1 TABLET EVERY DAY (NEED MD APPOINTMENT)   metoprolol tartrate (LOPRESSOR) 25 MG tablet TAKE 1 TABLET TWICE DAILY  (NEED MD APPOINTMENT FOR REFILLS)   Multiple Vitamin (MULTIVITAMIN) tablet Take 1 tablet by mouth daily.   rosuvastatin (CRESTOR) 10 MG tablet TAKE 1 TABLET EVERY DAY (NEED MD APPOINTMENT)   sertraline (ZOLOFT) 50 MG tablet TAKE 2 TABLETS EVERY DAY (NEED TO CALL AND SCHEDULE A FOLLOW UP FOR FURTHER REFILLS)   No facility-administered encounter medications on file as of 01/19/2022.    Allergies (verified) Codeine and Diltiazem   History: Past Medical History:  Diagnosis Date   Anxiety    Arthritis    Osteoarthritis/Rheumatoid   Atrial flutter (HCC)    Cancer (HCC)    Skin   Cataract    Community acquired pneumonia 04/04/2019   Fibrocystic breast disease    Glaucoma    Hypercholesterolemia    Hypertension    Hypothyroidism    Rheumatoid arthritis ([place])    Thyroid disease    Hypothyroid   Past Surgical History:  Procedure Laterality Date   ABDOMINAL HYSTERECTOMY     Total abd. hysterectomu and bilateral salping-oophorectomy   EYE SURGERY Right    Glaucoma surgery and cateract   HEMORRHOID SURGERY     status post dilatation and curettage  1975   TONSILLECTOMY     TONSILLECTOMY     TUBAL LIGATION  VIDEO BRONCHOSCOPY WITH ENDOBRONCHIAL NAVIGATION N/A 07/17/2019   Procedure: VIDEO BRONCHOSCOPY WITH ENDOBRONCHIAL NAVIGATION;  Surgeon: Tyler Pita, MD;  Location: [facility] ORS;  Service: Pulmonary;  Laterality: N/A;   Family History  Problem Relation Age of Onset   CAD Mother    Arthritis Mother        Rheumatoid Arthritis   Heart failure Mother    Arthritis Father        Rheumatoid/Osteoarthritis and Blindness, bleeding Tendencies,Glaucoma   CAD Father    Heart failure Father    Hypertension Maternal Grandmother    COPD Neg Hx    Social History   Socioeconomic History   Marital status: Divorced    Spouse name: Not on file   Number of children: Not on file   Years of education: Not on file   Highest education level: Not on file  Occupational History   Not  on file  Tobacco Use   Smoking status: Every Day    Packs/day: 0.50    Years: 30.00    Total pack years: 15.00    Types: Cigarettes   Smokeless tobacco: Never  Vaping Use   Vaping Use: Never used  Substance and Sexual Activity   Alcohol use: Yes    Comment: rarely   Drug use: No   Sexual activity: Not on file  Other Topics Concern   Not on file  Social History Narrative   Not on file   Social Determinants of Health   Financial Resource Strain: Low Risk  (01/19/2022)   Overall Financial Resource Strain (CARDIA)    Difficulty of Paying Living Expenses: Not very hard  Food Insecurity: No Food Insecurity (01/19/2022)   Hunger Vital Sign    Worried About Running Out of Food in the Last Year: Never true    Ran Out of Food in the Last Year: Never true  Transportation Needs: No Transportation Needs (01/19/2022)   PRAPARE - Hydrologist (Medical): No    Lack of Transportation (Non-Medical): No  Physical Activity: Sufficiently Active (01/19/2022)   Exercise Vital Sign    Days of Exercise per Week: 5 days    Minutes of Exercise per Session: 30 min  Stress: No Stress Concern Present (01/19/2022)   [place]    Feeling of Stress : Only a little  Social Connections: Unknown (01/19/2022)   Social Connection and Isolation Panel [NHANES]    Frequency of Communication with Friends and Family: Not on file    Frequency of Social Gatherings with Friends and Family: More than three times a week    Attends Religious Services: Not on Advertising copywriter or Organizations: Not on file    Attends Archivist Meetings: Not on file    Marital Status: Not on file    Tobacco Counseling Ready to quit: Not Answered Counseling given: Not Answered   Clinical Intake:  Pre-visit preparation completed: Yes        Diabetes: No  How often do you need to have someone help you when you  read instructions, pamphlets, or other written materials from your doctor or pharmacy?: 1 - Never   Interpreter Needed?: No    Activities of Daily Living    01/19/2022    3:22 PM  In your present state of health, do you have any difficulty performing the following activities:  Hearing? 0  Vision? 0  Difficulty concentrating or making  decisions? 0  Walking or climbing stairs? 0  Dressing or bathing? 0  Doing errands, shopping? 1  Preparing Food and eating ? N  Using the Toilet? N  In the past six months, have you accidently leaked urine? N  Do you have problems with loss of bowel control? N  Managing your Medications? N  Managing your Finances? N  Housekeeping or managing your Housekeeping? N    Patient Care Team: Leone Haven, MD as PCP - General (Family Medicine) Telford Nab, RN as Oncology Nurse Navigator Grayland Ormond, Kathlene November, MD as Consulting Physician (Oncology)  Indicate any recent Medical Services you may have received from other than [facility] providers in the past year (date may be approximate).     Assessment:   This is a routine wellness examination for [place].  Virtual Visit via Telephone Note  I connected with  Karen Kays on 01/19/22 at  3:30 PM EDT by telephone and verified that I am speaking with the correct person using two identifiers.  Location: Patient: home Provider: office Persons participating in the virtual visit: patient/Nurse Health Advisor   I discussed the limitations of performing an evaluation and management service by telehealth. We continued and completed visit with audio only. Some vital signs may be absent or patient reported.   Hearing/Vision screen Hearing Screening - Comments:: Patient is able to hear conversational tones without difficulty. No issues reported. Vision Screening - Comments:: Followed by [facility] Wears reading glasses  Cataract extraction, R eye only  Glaucoma; OTC drops in use They have regular  follow up with the ophthalmologist  Dietary issues and exercise activities discussed:   Healthy diet Good water intake   Goals Addressed               This Visit's Progress     Patient Stated     Follow up with Primary Care Provider (pt-stated)        Keep all routine appointments.       Depression Screen    01/19/2022    3:22 PM 01/18/2021    9:08 AM 08/24/2020    2:29 PM 01/16/2020    9:20 AM 06/20/2019    1:10 PM 05/03/2019   11:35 AM 04/04/2019   12:59 PM  PHQ 2/9 Scores  PHQ - 2 Score 0 0 0 0 0 0 0    Fall Risk    01/19/2022    3:19 PM 01/18/2021    9:10 AM 08/24/2020    2:29 PM 01/16/2020    9:16 AM 06/20/2019    1:10 PM  [place] in the past year? 0 0 0 0 0  Number falls in past yr: 0  0 0   Injury with Fall? 0      Follow up Falls evaluation completed Falls evaluation completed Falls evaluation completed Falls evaluation completed Falls evaluation completed    [place]: Home free of loose throw rugs in walkways, pet beds, electrical cords, etc? Yes  Adequate lighting in your home to reduce risk of falls? Yes   ASSISTIVE DEVICES UTILIZED TO PREVENT FALLS: Life alert? No  Use of a cane, walker or w/c? No  Grab bars in the bathroom? No  Shower chair or bench in shower? No  Comfort chair height toilet? Yes   TIMED UP AND GO: Was the test performed? No .   Cognitive Function:    01/10/2018    9:46 AM  MMSE -  Mini Mental State Exam  Orientation to time 5  Orientation to Place 5  Registration 3  Attention/ Calculation 5  Recall 3  Language- name 2 objects 2  Language- repeat 1  Language- follow 3 step command 3  Language- read & follow direction 1  Write a sentence 1  Copy design 1  Total score 30        01/19/2022    3:32 PM 01/18/2021    9:35 AM 01/15/2019    9:22 AM  6CIT Screen  What Year? 0 points 0 points 0 points  What month? 0 points 0 points 0 points  What time? 0 points 0 points 0 points   Count back from 20 0 points 0 points 0 points  Months in reverse 0 points 0 points 0 points  Repeat phrase 0 points  0 points  Total Score 0 points  0 points    Immunizations Immunization History  Administered Date(s) Administered   H1N1 04/07/2008   Influenza Inj Mdck Quad Pf 06/04/2019   Influenza Split 05/28/2010   PPD Test 05/11/2015   Pneumococcal Conjugate-13 09/29/2014   Pneumococcal Polysaccharide-23 10/07/2011   Pneumococcal-Unspecified 10/07/2011   Screening Tests Health Maintenance  Topic Date Due   INFLUENZA VACCINE  12/21/2021   DEXA SCAN  02/20/2022 (Originally 01/24/2010)   Pneumonia Vaccine 34+ Years old  Completed   Hepatitis C Screening  Completed   HPV VACCINES  Aged Out   TETANUS/TDAP  Discontinued   COVID-19 Vaccine  Discontinued   Fecal DNA (Cologuard)  Discontinued   Zoster Vaccines- Shingrix  Discontinued   Health Maintenance Health Maintenance Due  Topic Date Due   INFLUENZA VACCINE  12/21/2021   Bone scan- deferred per patient  Vision Screening: Recommended annual ophthalmology exams for early detection of glaucoma and other disorders of the eye.  Dental Screening: Recommended annual dental exams for proper oral hygiene  Community Resource Referral / Chronic Care Management: CRR required this visit?  No   CCM required this visit?  No      Plan:     I have personally reviewed and noted the following in the patient's chart:   Medical and social history Use of alcohol, tobacco or illicit drugs  Current medications and supplements including opioid prescriptions. Patient is not currently taking opioid prescriptions. Functional ability and status Nutritional status Physical activity Advanced directives List of other physicians Hospitalizations, surgeries, and ER visits in previous 12 months Vitals Screenings to include cognitive, depression, and falls Referrals and appointments  In addition, I have reviewed and discussed with  patient certain preventive protocols, quality metrics, and best practice recommendations. A written personalized care plan for preventive services as well as general preventive health recommendations were provided to patient.     Varney Biles, LPN   2/44/0102

## 2022-01-19 NOTE — Patient Instructions (Addendum)
Tamara Whitehead , Thank you for taking time to come for your Medicare Wellness Visit. I appreciate your ongoing commitment to your health goals. Please review the following plan we discussed and let me know if I can assist you in the future.   These are the goals we discussed:  Goals       Patient Stated     Follow up with Primary Care Provider (pt-stated)      Keep all routine appointments.        This is a list of the screening recommended for you and due dates:  Health Maintenance  Topic Date Due   Flu Shot  12/21/2021   DEXA scan (bone density measurement)  02/20/2022*   Pneumonia Vaccine  Completed   Hepatitis C Screening: USPSTF Recommendation to screen - Ages 2-77 yo.  Completed   HPV Vaccine  Aged Out   Tetanus Vaccine  Discontinued   COVID-19 Vaccine  Discontinued   Cologuard (Stool DNA test)  Discontinued   Zoster (Shingles) Vaccine  Discontinued  *Topic was postponed. The date shown is not the original due date.     Preventive Care 77 Years and Older, Female Preventive care refers to lifestyle choices and visits with your health care provider that can promote health and wellness. What does preventive care include? A yearly physical exam. This is also called an annual well check. Dental exams once or twice a year. Routine eye exams. Ask your health care provider how often you should have your eyes checked. Personal lifestyle choices, including: Daily care of your teeth and gums. Regular physical activity. Eating a healthy diet. Avoiding tobacco and drug use. Limiting alcohol use. Practicing safe sex. Taking low-dose aspirin every day. Taking vitamin and mineral supplements as recommended by your health care provider. What happens during an annual well check? The services and screenings done by your health care provider during your annual well check will depend on your age, overall health, lifestyle risk factors, and family history of disease. Counseling  Your  health care provider may ask you questions about your: Alcohol use. Tobacco use. Drug use. Emotional well-being. Home and relationship well-being. Sexual activity. Eating habits. History of falls. Memory and ability to understand (cognition). Work and work Statistician. Reproductive health. Screening  You may have the following tests or measurements: Height, weight, and BMI. Blood pressure. Lipid and cholesterol levels. These may be checked every 5 years, or more frequently if you are over 30 years old. Skin check. Lung cancer screening. You may have this screening every year starting at age 5 if you have a 30-pack-year history of smoking and currently smoke or have quit within the past 15 years. Fecal occult blood test (FOBT) of the stool. You may have this test every year starting at age 54. Flexible sigmoidoscopy or colonoscopy. You may have a sigmoidoscopy every 5 years or a colonoscopy every 10 years starting at age 19. Hepatitis C blood test. Hepatitis B blood test. Sexually transmitted disease (STD) testing. Diabetes screening. This is done by checking your blood sugar (glucose) after you have not eaten for a while (fasting). You may have this done every 1-3 years. Bone density scan. This is done to screen for osteoporosis. You may have this done starting at age 92. Mammogram. This may be done every 1-2 years. Talk to your health care provider about how often you should have regular mammograms. Talk with your health care provider about your test results, treatment options, and if necessary, the need  for more tests. Vaccines  Your health care provider may recommend certain vaccines, such as: Influenza vaccine. This is recommended every year. Tetanus, diphtheria, and acellular pertussis (Tdap, Td) vaccine. You may need a Td booster every 10 years. Zoster vaccine. You may need this after age 3. Pneumococcal 13-valent conjugate (PCV13) vaccine. One dose is recommended after age  64. Pneumococcal polysaccharide (PPSV23) vaccine. One dose is recommended after age 68. Talk to your health care provider about which screenings and vaccines you need and how often you need them. This information is not intended to replace advice given to you by your health care provider. Make sure you discuss any questions you have with your health care provider. Document Released: 06/05/2015 Document Revised: 01/27/2016 Document Reviewed: 03/10/2015 Elsevier Interactive Patient Education  2017 [place] Prevention in the Home Falls can cause injuries. They can happen to people of all ages. There are many things you can do to make your home safe and to help prevent falls. What can I do on the outside of my home? Regularly fix the edges of walkways and driveways and fix any cracks. Remove anything that might make you trip as you walk through a door, such as a raised step or threshold. Trim any bushes or trees on the path to your home. Use bright outdoor lighting. Clear any walking paths of anything that might make someone trip, such as rocks or tools. Regularly check to see if handrails are loose or broken. Make sure that both sides of any steps have handrails. Any raised decks and porches should have guardrails on the edges. Have any leaves, snow, or ice cleared regularly. Use sand or salt on walking paths during winter. Clean up any spills in your garage right away. This includes oil or grease spills. What can I do in the bathroom? Use night lights. Install grab bars by the toilet and in the tub and shower. Do not use towel bars as grab bars. Use non-skid mats or decals in the tub or shower. If you need to sit down in the shower, use a plastic, non-slip stool. Keep the floor dry. Clean up any water that spills on the floor as soon as it happens. Remove soap buildup in the tub or shower regularly. Attach bath mats securely with double-sided non-slip rug tape. Do not have throw  rugs and other things on the floor that can make you trip. What can I do in the bedroom? Use night lights. Make sure that you have a light by your bed that is easy to reach. Do not use any sheets or blankets that are too big for your bed. They should not hang down onto the floor. Have a firm chair that has side arms. You can use this for support while you get dressed. Do not have throw rugs and other things on the floor that can make you trip. What can I do in the kitchen? Clean up any spills right away. Avoid walking on wet floors. Keep items that you use a lot in easy-to-reach places. If you need to reach something above you, use a strong step stool that has a grab bar. Keep electrical cords out of the way. Do not use floor polish or wax that makes floors slippery. If you must use wax, use non-skid floor wax. Do not have throw rugs and other things on the floor that can make you trip. What can I do with my stairs? Do not leave any items on the stairs.  Make sure that there are handrails on both sides of the stairs and use them. Fix handrails that are broken or loose. Make sure that handrails are as long as the stairways. Check any carpeting to make sure that it is firmly attached to the stairs. Fix any carpet that is loose or worn. Avoid having throw rugs at the top or bottom of the stairs. If you do have throw rugs, attach them to the floor with carpet tape. Make sure that you have a light switch at the top of the stairs and the bottom of the stairs. If you do not have them, ask someone to add them for you. What else can I do to help prevent falls? Wear shoes that: Do not have high heels. Have rubber bottoms. Are comfortable and fit you well. Are closed at the toe. Do not wear sandals. If you use a stepladder: Make sure that it is fully opened. Do not climb a closed stepladder. Make sure that both sides of the stepladder are locked into place. Ask someone to hold it for you, if  possible. Clearly mark and make sure that you can see: Any grab bars or handrails. First and last steps. Where the edge of each step is. Use tools that help you move around (mobility aids) if they are needed. These include: Canes. Walkers. Scooters. Crutches. Turn on the lights when you go into a dark area. Replace any light bulbs as soon as they burn out. Set up your furniture so you have a clear path. Avoid moving your furniture around. If any of your floors are uneven, fix them. If there are any pets around you, be aware of where they are. Review your medicines with your doctor. Some medicines can make you feel dizzy. This can increase your chance of falling. Ask your doctor what other things that you can do to help prevent falls. This information is not intended to replace advice given to you by your health care provider. Make sure you discuss any questions you have with your health care provider. Document Released: 03/05/2009 Document Revised: 10/15/2015 Document Reviewed: 06/13/2014 Elsevier Interactive Patient Education  2017 [employer].

## 2022-02-03 ENCOUNTER — Other Ambulatory Visit: Payer: Self-pay | Admitting: Family

## 2022-02-03 DIAGNOSIS — D696 Thrombocytopenia, unspecified: Secondary | ICD-10-CM

## 2022-02-03 DIAGNOSIS — E039 Hypothyroidism, unspecified: Secondary | ICD-10-CM

## 2022-02-10 ENCOUNTER — Telehealth: Payer: Self-pay | Admitting: *Deleted

## 2022-02-10 NOTE — Patient Outreach (Signed)
  Care Coordination   02/10/2022 Name: Tamara Whitehead MRN: [id number] DOB: 1946-03-05   Care Coordination Outreach Attempts:  An unsuccessful telephone outreach was attempted today to offer the patient information about available care coordination services as a benefit of their health plan.   Follow Up Plan:  Additional outreach attempts will be made to offer the patient care coordination information and services.   Encounter Outcome:  No Answer  Care Coordination Interventions Activated:  Yes   Care Coordination Interventions:  No, not indicated    [place] [facility] [phone]

## 2022-04-20 ENCOUNTER — Encounter: Payer: Self-pay | Admitting: *Deleted

## 2022-04-20 ENCOUNTER — Other Ambulatory Visit: Payer: Self-pay

## 2022-04-20 ENCOUNTER — Encounter
Admission: EM | Disposition: A | Discharge status: Home / Self Care | Payer: Self-pay | Source: Home / Self Care | Attending: Internal Medicine

## 2022-04-20 ENCOUNTER — Telehealth: Payer: Self-pay | Admitting: Family Medicine

## 2022-04-20 ENCOUNTER — Inpatient Hospital Stay
Admission: EM | Admit: 2022-04-20 | Discharge: 2022-04-23 | DRG: 025 | Disposition: A | Discharge status: Home / Self Care | Payer: Medicare HMO | Attending: Internal Medicine | Admitting: Internal Medicine

## 2022-04-20 ENCOUNTER — Emergency Department: Payer: Medicare HMO | Admitting: Anesthesiology

## 2022-04-20 ENCOUNTER — Emergency Department: Payer: Medicare HMO

## 2022-04-20 DIAGNOSIS — Y92481 Parking lot as the place of occurrence of the external cause: Secondary | ICD-10-CM | POA: Diagnosis not present

## 2022-04-20 DIAGNOSIS — Z90710 Acquired absence of both cervix and uterus: Secondary | ICD-10-CM

## 2022-04-20 DIAGNOSIS — S40021A Contusion of right upper arm, initial encounter: Secondary | ICD-10-CM | POA: Diagnosis present

## 2022-04-20 DIAGNOSIS — R296 Repeated falls: Secondary | ICD-10-CM | POA: Diagnosis present

## 2022-04-20 DIAGNOSIS — W19XXXA Unspecified fall, initial encounter: Secondary | ICD-10-CM | POA: Diagnosis not present

## 2022-04-20 DIAGNOSIS — Z7901 Long term (current) use of anticoagulants: Secondary | ICD-10-CM | POA: Diagnosis not present

## 2022-04-20 DIAGNOSIS — E7800 Pure hypercholesterolemia, unspecified: Secondary | ICD-10-CM | POA: Diagnosis present

## 2022-04-20 DIAGNOSIS — S42251A Displaced fracture of greater tuberosity of right humerus, initial encounter for closed fracture: Secondary | ICD-10-CM | POA: Diagnosis present

## 2022-04-20 DIAGNOSIS — E119 Type 2 diabetes mellitus without complications: Secondary | ICD-10-CM | POA: Diagnosis present

## 2022-04-20 DIAGNOSIS — L409 Psoriasis, unspecified: Secondary | ICD-10-CM | POA: Diagnosis present

## 2022-04-20 DIAGNOSIS — S065XAA Traumatic subdural hemorrhage with loss of consciousness status unknown, initial encounter: Secondary | ICD-10-CM

## 2022-04-20 DIAGNOSIS — F32A Depression, unspecified: Secondary | ICD-10-CM | POA: Diagnosis present

## 2022-04-20 DIAGNOSIS — S065X0A Traumatic subdural hemorrhage without loss of consciousness, initial encounter: Principal | ICD-10-CM

## 2022-04-20 DIAGNOSIS — S0081XA Abrasion of other part of head, initial encounter: Secondary | ICD-10-CM | POA: Diagnosis present

## 2022-04-20 DIAGNOSIS — E039 Hypothyroidism, unspecified: Secondary | ICD-10-CM | POA: Diagnosis present

## 2022-04-20 DIAGNOSIS — I10 Essential (primary) hypertension: Secondary | ICD-10-CM | POA: Diagnosis not present

## 2022-04-20 DIAGNOSIS — Z85828 Personal history of other malignant neoplasm of skin: Secondary | ICD-10-CM | POA: Diagnosis not present

## 2022-04-20 DIAGNOSIS — S42201A Unspecified fracture of upper end of right humerus, initial encounter for closed fracture: Secondary | ICD-10-CM | POA: Diagnosis not present

## 2022-04-20 DIAGNOSIS — I482 Chronic atrial fibrillation, unspecified: Secondary | ICD-10-CM | POA: Diagnosis present

## 2022-04-20 DIAGNOSIS — R0602 Shortness of breath: Secondary | ICD-10-CM | POA: Diagnosis not present

## 2022-04-20 DIAGNOSIS — W010XXA Fall on same level from slipping, tripping and stumbling without subsequent striking against object, initial encounter: Secondary | ICD-10-CM | POA: Diagnosis present

## 2022-04-20 DIAGNOSIS — Z79890 Hormone replacement therapy (postmenopausal): Secondary | ICD-10-CM

## 2022-04-20 DIAGNOSIS — I4892 Unspecified atrial flutter: Secondary | ICD-10-CM | POA: Diagnosis present

## 2022-04-20 DIAGNOSIS — Z8249 Family history of ischemic heart disease and other diseases of the circulatory system: Secondary | ICD-10-CM | POA: Diagnosis not present

## 2022-04-20 DIAGNOSIS — R531 Weakness: Secondary | ICD-10-CM | POA: Diagnosis not present

## 2022-04-20 DIAGNOSIS — F17210 Nicotine dependence, cigarettes, uncomplicated: Secondary | ICD-10-CM | POA: Diagnosis present

## 2022-04-20 DIAGNOSIS — S06A0XA Traumatic brain compression without herniation, initial encounter: Secondary | ICD-10-CM

## 2022-04-20 DIAGNOSIS — G935 Compression of brain: Secondary | ICD-10-CM | POA: Diagnosis not present

## 2022-04-20 DIAGNOSIS — R791 Abnormal coagulation profile: Secondary | ICD-10-CM | POA: Diagnosis present

## 2022-04-20 DIAGNOSIS — Z79899 Other long term (current) drug therapy: Secondary | ICD-10-CM | POA: Diagnosis not present

## 2022-04-20 DIAGNOSIS — S42291A Other displaced fracture of upper end of right humerus, initial encounter for closed fracture: Secondary | ICD-10-CM | POA: Diagnosis present

## 2022-04-20 DIAGNOSIS — D6489 Other specified anemias: Secondary | ICD-10-CM | POA: Diagnosis not present

## 2022-04-20 DIAGNOSIS — M069 Rheumatoid arthritis, unspecified: Secondary | ICD-10-CM | POA: Diagnosis not present

## 2022-04-20 DIAGNOSIS — Z885 Allergy status to narcotic agent status: Secondary | ICD-10-CM

## 2022-04-20 DIAGNOSIS — R519 Headache, unspecified: Secondary | ICD-10-CM | POA: Diagnosis not present

## 2022-04-20 DIAGNOSIS — I480 Paroxysmal atrial fibrillation: Secondary | ICD-10-CM | POA: Diagnosis present

## 2022-04-20 DIAGNOSIS — H409 Unspecified glaucoma: Secondary | ICD-10-CM | POA: Diagnosis present

## 2022-04-20 DIAGNOSIS — R29898 Other symptoms and signs involving the musculoskeletal system: Secondary | ICD-10-CM

## 2022-04-20 DIAGNOSIS — F418 Other specified anxiety disorders: Secondary | ICD-10-CM | POA: Diagnosis not present

## 2022-04-20 DIAGNOSIS — D72828 Other elevated white blood cell count: Secondary | ICD-10-CM | POA: Diagnosis present

## 2022-04-20 DIAGNOSIS — G9389 Other specified disorders of brain: Secondary | ICD-10-CM | POA: Diagnosis not present

## 2022-04-20 DIAGNOSIS — I6200 Nontraumatic subdural hemorrhage, unspecified: Secondary | ICD-10-CM | POA: Diagnosis not present

## 2022-04-20 DIAGNOSIS — M47812 Spondylosis without myelopathy or radiculopathy, cervical region: Secondary | ICD-10-CM | POA: Diagnosis not present

## 2022-04-20 HISTORY — PX: BURR HOLE: SHX908

## 2022-04-20 LAB — CBC
HCT: 32 % — ABNORMAL LOW (ref 36.0–46.0)
Hemoglobin: 10.1 g/dL — ABNORMAL LOW (ref 12.0–15.0)
MCH: 30.1 pg (ref 26.0–34.0)
MCHC: 31.6 g/dL (ref 30.0–36.0)
MCV: 95.2 fL (ref 80.0–100.0)
Platelets: 229 10*3/uL (ref 150–400)
RBC: 3.36 MIL/uL — ABNORMAL LOW (ref 3.87–5.11)
RDW: 14.7 % (ref 11.5–15.5)
WBC: 12.7 10*3/uL — ABNORMAL HIGH (ref 4.0–10.5)
nRBC: 0 % (ref 0.0–0.2)

## 2022-04-20 LAB — PROTIME-INR
INR: 1.3 — ABNORMAL HIGH (ref 0.8–1.2)
Prothrombin Time: 15.8 seconds — ABNORMAL HIGH (ref 11.4–15.2)

## 2022-04-20 LAB — BASIC METABOLIC PANEL
Anion gap: 8 (ref 5–15)
BUN: 14 mg/dL (ref 8–23)
CO2: 20 mmol/L — ABNORMAL LOW (ref 22–32)
Calcium: 8.9 mg/dL (ref 8.9–10.3)
Chloride: 113 mmol/L — ABNORMAL HIGH (ref 98–111)
Creatinine, Ser: 0.93 mg/dL (ref 0.44–1.00)
GFR, Estimated: >60 mL/min (ref >60)
Glucose, Bld: 111 mg/dL — ABNORMAL HIGH (ref 70–99)
Potassium: 3.9 mmol/L (ref 3.5–5.1)
Sodium: 141 mmol/L (ref 135–145)

## 2022-04-20 LAB — PROCALCITONIN: Procalcitonin: <0.1 ng/mL

## 2022-04-20 LAB — MAGNESIUM: Magnesium: 2 mg/dL (ref 1.7–2.4)

## 2022-04-20 LAB — PHOSPHORUS: Phosphorus: 2.9 mg/dL (ref 2.5–4.6)

## 2022-04-20 LAB — TROPONIN I (HIGH SENSITIVITY): Troponin I (High Sensitivity): 12 ng/L (ref <18)

## 2022-04-20 SURGERY — CREATION, CRANIAL BURR HOLE
Anesthesia: General | Site: Head | Laterality: Bilateral

## 2022-04-20 MED ORDER — ROCURONIUM BROMIDE 100 MG/10ML IV SOLN
INTRAVENOUS | Status: DC | PRN
  Administered 2022-04-20: 10 mg via INTRAVENOUS
  Administered 2022-04-20: 40 mg via INTRAVENOUS

## 2022-04-20 MED ORDER — SUGAMMADEX SODIUM 200 MG/2ML IV SOLN
INTRAVENOUS | Status: DC | PRN
  Administered 2022-04-20: 200 mg via INTRAVENOUS

## 2022-04-20 MED ORDER — EMPTY CONTAINERS FLEXIBLE MISC
900.0000 mg | Freq: Once | Status: AC
  Administered 2022-04-20: 900 mg via INTRAVENOUS
  Filled 2022-04-20: qty 90

## 2022-04-20 MED ORDER — PHENYLEPHRINE HCL (PRESSORS) 10 MG/ML IV SOLN
INTRAVENOUS | Status: DC | PRN
  Administered 2022-04-20 (×2): 80 ug via INTRAVENOUS
  Administered 2022-04-20: 160 ug via INTRAVENOUS
  Administered 2022-04-20: 80 ug via INTRAVENOUS
  Administered 2022-04-20: 160 ug via INTRAVENOUS

## 2022-04-20 MED ORDER — PANTOPRAZOLE SODIUM 40 MG IV SOLR
40.0000 mg | INTRAVENOUS | Status: DC
  Administered 2022-04-21: 40 mg via INTRAVENOUS
  Filled 2022-04-20: qty 10

## 2022-04-20 MED ORDER — LIDOCAINE-EPINEPHRINE 1 %-1:100000 IJ SOLN
INTRAMUSCULAR | Status: AC
  Filled 2022-04-20: qty 2

## 2022-04-20 MED ORDER — DROPERIDOL 2.5 MG/ML IJ SOLN: 0.6250 mg | Freq: Once | INTRAMUSCULAR | Status: DC | PRN

## 2022-04-20 MED ORDER — LIDOCAINE HCL (CARDIAC) PF 100 MG/5ML IV SOSY
PREFILLED_SYRINGE | INTRAVENOUS | Status: DC | PRN
  Administered 2022-04-20: 60 mg via INTRAVENOUS

## 2022-04-20 MED ORDER — PROPOFOL 1000 MG/100ML IV EMUL
INTRAVENOUS | Status: AC
  Filled 2022-04-20: qty 100

## 2022-04-20 MED ORDER — PROPOFOL 10 MG/ML IV BOLUS
INTRAVENOUS | Status: DC | PRN
  Administered 2022-04-20: 100 mg via INTRAVENOUS

## 2022-04-20 MED ORDER — ONDANSETRON HCL 4 MG/2ML IJ SOLN
INTRAMUSCULAR | Status: DC | PRN
  Administered 2022-04-20: 4 mg via INTRAVENOUS

## 2022-04-20 MED ORDER — FENTANYL CITRATE (PF) 100 MCG/2ML IJ SOLN
INTRAMUSCULAR | Status: DC | PRN
  Administered 2022-04-20: 25 ug via INTRAVENOUS
  Administered 2022-04-20: 50 ug via INTRAVENOUS
  Administered 2022-04-20: 25 ug via INTRAVENOUS

## 2022-04-20 MED ORDER — PROPOFOL 10 MG/ML IV BOLUS
INTRAVENOUS | Status: AC
  Filled 2022-04-20: qty 20

## 2022-04-20 MED ORDER — BACITRACIN ZINC 500 UNIT/GM EX OINT
TOPICAL_OINTMENT | CUTANEOUS | Status: AC
  Filled 2022-04-20: qty 28.35

## 2022-04-20 MED ORDER — OXYCODONE HCL 5 MG PO TABS
5.0000 mg | ORAL_TABLET | Freq: Once | ORAL | Status: AC | PRN
  Administered 2022-04-21: 5 mg via ORAL

## 2022-04-20 MED ORDER — CEFAZOLIN SODIUM-DEXTROSE 2-3 GM-%(50ML) IV SOLR
INTRAVENOUS | Status: DC | PRN
  Administered 2022-04-20: 2 g via INTRAVENOUS

## 2022-04-20 MED ORDER — GELATIN ABSORBABLE 100 CM EX MISC
CUTANEOUS | Status: AC
  Filled 2022-04-20: qty 1

## 2022-04-20 MED ORDER — PROPOFOL 500 MG/50ML IV EMUL
INTRAVENOUS | Status: DC | PRN
  Administered 2022-04-20: 80 ug/kg/min via INTRAVENOUS

## 2022-04-20 MED ORDER — FENTANYL CITRATE (PF) 100 MCG/2ML IJ SOLN: 25.0000 ug | INTRAMUSCULAR | Status: DC | PRN

## 2022-04-20 MED ORDER — PROMETHAZINE HCL 25 MG/ML IJ SOLN: 6.2500 mg | INTRAMUSCULAR | Status: DC | PRN

## 2022-04-20 MED ORDER — DOCUSATE SODIUM 100 MG PO CAPS: 100.0000 mg | ORAL_CAPSULE | Freq: Two times a day (BID) | ORAL | Status: DC | PRN

## 2022-04-20 MED ORDER — SODIUM CHLORIDE 0.9 % IV SOLN: INTRAVENOUS | Status: DC | PRN

## 2022-04-20 MED ORDER — EPHEDRINE SULFATE (PRESSORS) 50 MG/ML IJ SOLN
INTRAMUSCULAR | Status: DC | PRN
  Administered 2022-04-20 (×2): 5 mg via INTRAVENOUS
  Administered 2022-04-20: 10 mg via INTRAVENOUS

## 2022-04-20 MED ORDER — ONDANSETRON HCL 4 MG/2ML IJ SOLN: 4.0000 mg | Freq: Four times a day (QID) | INTRAMUSCULAR | Status: DC | PRN

## 2022-04-20 MED ORDER — POLYETHYLENE GLYCOL 3350 17 G PO PACK: 17.0000 g | PACK | Freq: Every day | ORAL | Status: DC | PRN

## 2022-04-20 MED ORDER — OXYCODONE HCL 5 MG/5ML PO SOLN: 5.0000 mg | Freq: Once | ORAL | Status: AC | PRN

## 2022-04-20 MED ORDER — DEXAMETHASONE SODIUM PHOSPHATE 10 MG/ML IJ SOLN
INTRAMUSCULAR | Status: DC | PRN
  Administered 2022-04-20: 6 mg via INTRAVENOUS

## 2022-04-20 MED ORDER — FENTANYL CITRATE (PF) 100 MCG/2ML IJ SOLN
INTRAMUSCULAR | Status: AC
  Filled 2022-04-20: qty 2

## 2022-04-20 MED ORDER — ACETAMINOPHEN 10 MG/ML IV SOLN: 1000.0000 mg | Freq: Once | INTRAVENOUS | Status: DC | PRN

## 2022-04-20 MED ORDER — LIDOCAINE-EPINEPHRINE 1 %-1:100000 IJ SOLN
INTRAMUSCULAR | Status: DC | PRN
  Administered 2022-04-20: 5 mL

## 2022-04-20 SURGICAL SUPPLY — 72 items
AGENT HMST KT MTR STRL THRMB (HEMOSTASIS)
APL PRP STRL LF ISPRP CHG 10.5 (MISCELLANEOUS) ×1
APL SKNCLS STERI-STRIP NONHPOA (GAUZE/BANDAGES/DRESSINGS) ×1
APPLICATOR CHLORAPREP 10.5 ORG (MISCELLANEOUS) ×1 IMPLANT
BENZOIN TINCTURE PRP APPL 2/3 (GAUZE/BANDAGES/DRESSINGS) IMPLANT
BLADE CLIPPER SPEC (BLADE) ×1 IMPLANT
BULB RESERV EVAC DRAIN JP 100C (MISCELLANEOUS) IMPLANT
BUR ACORN 7.5 PRECISION (BURR) ×1 IMPLANT
CATH ROBINSON RED A/P 10FR (CATHETERS) IMPLANT
COUNTER NEEDLE 20/40 LG (NEEDLE) ×1 IMPLANT
COVER LIGHT HANDLE STERIS (MISCELLANEOUS) ×2 IMPLANT
DRAIN JP 10F RND SILICONE (MISCELLANEOUS) IMPLANT
DRAPE SURG 17X11 SM STRL (DRAPES) ×4 IMPLANT
DRAPE WARM FLUID 44X44 (DRAPES) ×1 IMPLANT
DRSG TEGADERM 4X4.75 (GAUZE/BANDAGES/DRESSINGS) IMPLANT
DRSG TELFA 3X4 N-ADH STERILE (GAUZE/BANDAGES/DRESSINGS) ×1 IMPLANT
ELECT CAUTERY BLADE TIP 2.5 (TIP) ×1
ELECT REM PT RETURN 9FT ADLT (ELECTROSURGICAL) ×1
ELECTRODE CAUTERY BLDE TIP 2.5 (TIP) ×1 IMPLANT
ELECTRODE REM PT RTRN 9FT ADLT (ELECTROSURGICAL) ×1 IMPLANT
GAUZE 4X4 16PLY ~~LOC~~+RFID DBL (SPONGE) ×3 IMPLANT
GAUZE SPONGE 4X4 12PLY STRL (GAUZE/BANDAGES/DRESSINGS) ×4 IMPLANT
GAUZE XEROFORM 1X8 LF (GAUZE/BANDAGES/DRESSINGS) IMPLANT
GAUZE XEROFORM 5X9 LF (GAUZE/BANDAGES/DRESSINGS) IMPLANT
GLOVE BIOGEL PI IND STRL 6.5 (GLOVE) ×1 IMPLANT
GLOVE SURG SYN 6.5 ES PF (GLOVE) IMPLANT
GLOVE SURG SYN 6.5 PF PI (GLOVE) ×1 IMPLANT
GLOVE SURG SYN 8.5  E (GLOVE) ×2
GLOVE SURG SYN 8.5 E (GLOVE) ×2 IMPLANT
GLOVE SURG SYN 8.5 PF PI (GLOVE) ×6 IMPLANT
GOWN SRG LRG LVL 4 IMPRV REINF (GOWNS) ×1 IMPLANT
GOWN SRG XL LVL 3 NONREINFORCE (GOWNS) ×1 IMPLANT
GOWN STRL NON-REIN TWL XL LVL3 (GOWNS) ×1
GOWN STRL REIN LRG LVL4 (GOWNS) ×1
GRADUATE 1200CC STRL 31836 (MISCELLANEOUS) ×1 IMPLANT
HOLDER FOLEY CATH W/STRAP (MISCELLANEOUS) IMPLANT
KIT DRAIN CSF ACCUDRAIN (MISCELLANEOUS) ×1 IMPLANT
KIT TURNOVER KIT A (KITS) ×1 IMPLANT
MANIFOLD NEPTUNE II (INSTRUMENTS) ×1 IMPLANT
MARKER SKIN DUAL TIP RULER LAB (MISCELLANEOUS) ×2 IMPLANT
MAT ABSORB  FLUID 56X50 GRAY (MISCELLANEOUS) ×1
MAT ABSORB FLUID 56X50 GRAY (MISCELLANEOUS) ×1 IMPLANT
NDL HYPO 25GX1X1/2 BEV (NEEDLE) ×1 IMPLANT
NEEDLE HYPO 25GX1X1/2 BEV (NEEDLE) ×1 IMPLANT
NS IRRIG 1000ML POUR BTL (IV SOLUTION) IMPLANT
PACK LAMINECTOMY NEURO (CUSTOM PROCEDURE TRAY) ×1 IMPLANT
PAD ARMBOARD 7.5X6 YLW CONV (MISCELLANEOUS) ×2 IMPLANT
PAD PREP 24X41 OB/GYN DISP (PERSONAL CARE ITEMS) ×1 IMPLANT
PIN MAYFIELD SKULL DISP (PIN) IMPLANT
SCRUB TECHNI CARE 4 OZ NO DYE (MISCELLANEOUS) ×1 IMPLANT
SET CATH VENT DRAIN 3-15 1.9D (DRAIN) ×1 IMPLANT
SHEET NEURO XL SOL CTL (MISCELLANEOUS) ×1 IMPLANT
SOL PREP PVP 2OZ (MISCELLANEOUS) ×1
SOLUTION IRRIG SURGIPHOR (IV SOLUTION) ×1 IMPLANT
SOLUTION PREP PVP 2OZ (MISCELLANEOUS) ×1 IMPLANT
STAPLER SKIN PROX 35W (STAPLE) ×2 IMPLANT
STRIP CLOSURE SKIN 1/2X4 (GAUZE/BANDAGES/DRESSINGS) ×1 IMPLANT
SURGIFLO W/THROMBIN 8M KIT (HEMOSTASIS) IMPLANT
SURGILUBE 2OZ TUBE FLIPTOP (MISCELLANEOUS) IMPLANT
SUT MNCRL AB 3-0 PS2 27 (SUTURE) ×1 IMPLANT
SUT SILK 2 0 SH CR/8 (SUTURE) ×1 IMPLANT
SUT SILK 2 0SH CR/8 30 (SUTURE) IMPLANT
SUT VIC AB 3-0 SH 8-18 (SUTURE) ×1 IMPLANT
SYR 10ML LL (SYRINGE) ×2 IMPLANT
TAPE CLOTH 3X10 WHT NS LF (GAUZE/BANDAGES/DRESSINGS) IMPLANT
TAPE HY-TAPE .5X5Y PINK LF (GAUZE/BANDAGES/DRESSINGS) ×1 IMPLANT
TOWEL OR 17X26 4PK STRL BLUE (TOWEL DISPOSABLE) ×3 IMPLANT
TRAP FLUID SMOKE EVACUATOR (MISCELLANEOUS) ×1 IMPLANT
TRAY FOLEY MTR SLVR 16FR STAT (SET/KITS/TRAYS/PACK) IMPLANT
TUBING ART PRESS 48 MALE/FEM (TUBING) IMPLANT
TUBING CONNECTING 10 (TUBING) ×1 IMPLANT
WATER STERILE IRR 1000ML POUR (IV SOLUTION) ×4 IMPLANT

## 2022-04-20 NOTE — Anesthesia Procedure Notes (Signed)
Procedure Name: Intubation Date/Time: 04/20/2022 10:02 PM  Performed by: Aline Brochure, CRNAPre-anesthesia Checklist: Patient identified, Patient being monitored, Timeout performed, Emergency Drugs available and Suction available Patient Re-evaluated:Patient Re-evaluated prior to induction Oxygen Delivery Method: Circle system utilized Preoxygenation: Pre-oxygenation with 100% oxygen Induction Type: IV induction Ventilation: Mask ventilation without difficulty Laryngoscope Size: 3 and McGraph Grade View: Grade I Tube type: Oral Tube size: 7.0 mm Number of attempts: 1 Airway Equipment and Method: Stylet and Video-laryngoscopy Placement Confirmation: ETT inserted through vocal cords under direct vision, positive ETCO2 and breath sounds checked- equal and bilateral Secured at: 21 cm Tube secured with: Tape Dental Injury: Teeth and Oropharynx as per pre-operative assessment

## 2022-04-20 NOTE — Interval H&P Note (Signed)
History and Physical Interval Note:  04/20/2022 9:29 PM  Tamara Whitehead  has presented today for surgery, with the diagnosis of subdural hematoma.  The various methods of treatment have been discussed with the patient and family. After consideration of risks, benefits and other options for treatment, the patient has consented to  Procedure(s): BURR HOLES (Bilateral) as a surgical intervention.  The patient's history has been reviewed, patient examined, no change in status, stable for surgery.  I have reviewed the patient's chart and labs.  Questions were answered to the patient's satisfaction.    Heart sounds normal no MRG. Chest Clear to Auscultation Bilaterally.   Adrienna Karis

## 2022-04-20 NOTE — ED Notes (Signed)
Neuro in with patient for consult. Family at the bedside.

## 2022-04-20 NOTE — Consult Note (Signed)
Consult requested by:  Dr. Charna Whitehead  Consult requested for:  Subdural hematoma  Primary Physician:  Tamara Haven, MD  History of Present Illness: 04/20/2022 [name] is here today with a chief complaint of difficulty walking.  She had a substantial fall approximately 2 to 3 weeks ago when she was leaning over in a parking lot.  She lost her balance at the time and fell forward.  She did not lose consciousness and did not seek medical care.  She has had a worsening headache over the past week or so.  She reports that she began to feel off 2 to 3 days ago and had some difficulty with lifting her right arm.  This morning, she felt extremely unsteady on her feet and was unable to walk due to weakness in her right leg.  She had several falls.  She finally presented to the emergency department for evaluation where subdural hematoma was identified. I have utilized the care everywhere function in epic to review the outside records available from external health systems.  Review of Systems:  A 10 point review of systems is negative, except for the pertinent positives and negatives detailed in the HPI.  Past Medical History: Past Medical History:  Diagnosis Date   Anxiety    Arthritis    Osteoarthritis/Rheumatoid   Atrial flutter ([place])    Cancer ([place])    Skin   Cataract    Community acquired pneumonia 04/04/2019   Fibrocystic breast disease    Glaucoma    Hypercholesterolemia    Hypertension    Hypothyroidism    Rheumatoid arthritis ([place])    Thyroid disease    Hypothyroid    Past Surgical History: Past Surgical History:  Procedure Laterality Date   ABDOMINAL HYSTERECTOMY     Total abd. hysterectomu and bilateral salping-oophorectomy   EYE SURGERY Right    Glaucoma surgery and cateract   HEMORRHOID SURGERY     status post dilatation and curettage  1975   TONSILLECTOMY     TONSILLECTOMY     TUBAL LIGATION     VIDEO BRONCHOSCOPY WITH ENDOBRONCHIAL NAVIGATION N/A  07/17/2019   Procedure: VIDEO BRONCHOSCOPY WITH ENDOBRONCHIAL NAVIGATION;  Surgeon: Tyler Pita, MD;  Location: [facility] ORS;  Service: Pulmonary;  Laterality: N/A;    Allergies: Allergies as of 04/20/2022 - Review Complete 04/20/2022  Allergen Reaction Noted   Codeine Nausea And Vomiting 01/27/2015   Diltiazem Rash 06/20/2019    Medications: Current Meds  Medication Sig   acetaminophen (TYLENOL) 500 MG tablet Take 1,000 mg by mouth every 6 (six) hours as needed for moderate pain.   Calcium Carbonate (CALCIUM 600 PO) Take 600 mg by mouth every other day.    cetirizine (ZYRTEC) 10 MG tablet Take 10 mg by mouth daily.   Coenzyme Q-10 100 MG capsule Take 100 mg by mouth daily.   ELIQUIS 5 MG TABS tablet TAKE 1 TABLET TWICE DAILY   hydroxychloroquine (PLAQUENIL) 200 MG tablet    levothyroxine (SYNTHROID) 50 MCG tablet TAKE 1 TABLET EVERY DAY BEFORE BREAKFAST   losartan (COZAAR) 50 MG tablet TAKE 1 TABLET EVERY DAY (NEED MD APPOINTMENT)   metoprolol tartrate (LOPRESSOR) 25 MG tablet TAKE 1 TABLET TWICE DAILY (NEED MD APPOINTMENT FOR REFILLS)   Multiple Vitamin (MULTIVITAMIN) tablet Take 1 tablet by mouth daily.   rosuvastatin (CRESTOR) 10 MG tablet TAKE 1 TABLET EVERY DAY (NEED MD APPOINTMENT)   sertraline (ZOLOFT) 50 MG tablet TAKE 2 TABLETS EVERY DAY (NEED TO  CALL AND SCHEDULE A FOLLOW UP FOR FURTHER REFILLS)    Social History: Social History   Tobacco Use   Smoking status: Every Day    Packs/day: 0.50    Years: 30.00    Total pack years: 15.00    Types: Cigarettes   Smokeless tobacco: Never  Vaping Use   Vaping Use: Never used  Substance Use Topics   Alcohol use: Not Currently    Comment: rarely   Drug use: No    Family Medical History: Family History  Problem Relation Age of Onset   CAD Mother    Arthritis Mother        Rheumatoid Arthritis   Heart failure Mother    Arthritis Father        Rheumatoid/Osteoarthritis and Blindness, bleeding Tendencies,Glaucoma    CAD Father    Heart failure Father    Hypertension Maternal Grandmother    COPD Neg Hx     Physical Examination: Vitals:   04/20/22 2030 04/20/22 2100  BP: (!) 149/74 (!) 145/95  Pulse: 72 74  Resp: (!) 22 (!) 22  Temp:    SpO2: 98% 99%    General: Patient is well developed, well nourished, calm, collected, and in no apparent distress. Attention to examination is appropriate.  Neck:   Supple.  Full range of motion.  Respiratory: Patient is breathing without any difficulty.   NEUROLOGICAL:     Awake, alert, oriented to person, place, and time.  Speech is clear and fluent.   Cranial Nerves: Pupils equal round and reactive to light.  Facial tone is symmetric.  Facial sensation is symmetric. Shoulder shrug is symmetric. Tongue protrusion is midline. cannot test pronator drift due to right arm fracture  ROM of spine: full.    Strength: Side Biceps Triceps Deltoid Interossei Grip Wrist Ext. Wrist Flex.  R 4+ 4+ - 4+ 5 5 5   L 5 5 5 5 5 5 5    Side Iliopsoas Quads Hamstring PF DF EHL  R 3 4 4- 4- 5 5  L 5 5 5 5 5 5    Reflexes are 1+ and symmetric at the biceps, triceps, brachioradialis, patella and achilles.   Hoffman's is absent.   Bilateral upper and lower extremity sensation is intact to light touch.    No evidence of dysmetria noted.  Gait is untested.     Medical Decision Making  Imaging: CT Head 04/20/2022 IMPRESSION: 1. Isodense left convexity subdural hematoma measuring 20 mm in thickness with rightward midline shift of 4 mm. 2. Hypodense chronic right subdural hematoma, 7 mm. 3. No acute fracture or static subluxation of the cervical spine.   Critical Value/emergent results were called by telephone at the time of interpretation on 04/20/2022 at 7:44 pm to provider [facility] , who verbally acknowledged these results.     Electronically Signed   By: Tamara Whitehead M.D.   On: 04/20/2022 19:44  I have personally reviewed the images and agree with the  above interpretation.  Assessment and Plan: [name] is a pleasant 76 y.o. female with bilateral subdural hematomas.  These are chronic in nature.  The left-sided subdural is approximately 20 mm in thickness and causes 4 to 5 mm of left-to-right midline shift.  She has substantial brain compression.  She reports cognitive dysfunction, though she does have capacity.  She also has a right sided weakness due to her brain compression.  Given the size of her subdural hematoma and her neurologic symptoms, I feel that surgical  intervention is warranted.  I recommended bilateral bur hole drainage.  I discussed the planned procedure at length with the patient, including the risks, benefits, alternatives, and indications. The risks discussed include but are not limited to bleeding, infection, need for reoperation, spinal fluid leak, stroke, vision loss, anesthetic complication, coma, paralysis, and even death. I also described in detail that improvement was not guaranteed.  The patient expressed understanding of these risks, and asked that we proceed with surgery. I described the surgery in layman's terms, and gave ample opportunity for questions, which were answered to the best of my ability.  She took her Eliquis approximately 22 hours ago.  This case will be added on an emergent basis and she will have her anticoagulation reversed.  Given the necessity for anticoagulation, she is at somewhat heightened risk of postoperative complication.  She expressed understanding of this.   I have communicated my recommendations to the requesting physician and coordinated care to facilitate these recommendations.     Jamill Wetmore K. Izora Ribas MD, [facility] Neurosurgery

## 2022-04-20 NOTE — H&P (Signed)
NAME:  Tamara Whitehead, MRN:  [id number], DOB:  [birth year], LOS: 0 ADMISSION DATE:  04/20/2022, CONSULTATION DATE: 04/20/2022 REFERRING MD: Dr. Charna Archer, CHIEF COMPLAINT:   Weakness  History of Present Illness:  76 year old female presenting to [facility] ED from home on 04/20/2022 with complaints of weakness, headache, and recent falls.  History provided by patient bedside. She reports falling 3 weeks ago and a parking garage on the asphalt.  She recently sold her house and was moving items to a new location.  Son showed me a picture post fall and she had ecchymosis under both eyes and abrasion on her chin and bruising all over her upper right arm with some facial swelling.  She denies losing consciousness and had refused to go to the ED or urgent care to be checked out.  On 04/19/2022 patient and family members noticed some mild balance issues, headache and word finding difficulties.  The symptoms continued to worsen on 04/20/2022.  Her gait and balance became so poor she fell 3 times at home.  She denies hitting her head with any of these recent falls stating she "sat down hard on the floor".  Her headache comes and goes at worst 8/10, with no aggravating or alleviating factors.  She does also admit to intermittent discomfort in the right upper arm. She denies blurred vision, syncope, dizziness, nausea/ vomiting.  She has been taking her Eliquis consistently for a recent diagnosis of a flutter.  ED course: Upon arrival patient is alert and oriented, but reports word finding difficulty.  Vital signs stable with mild hypertension.  Imaging revealed RIGHT humeral head fracture with displaced greater tuberosity.  CT imaging consistent with bilateral subdural hematomas left greater than right with a 4 mm rightward midline shift.  Lab work significant for mild NAGMA, slightly elevated chloride, leukocytosis, mild anemia and elevated INR (consistent with DOAC use). Orthopedics consulted by EDP, see recommendations in  plan of care. Neurosurgery consulted and the plan is to take her directly to the OR for evacuation and drain placement, request for ICU admission. Medications given: Andexxa 900 mg Initial Vitals: 98.3, 20, 77, 156/101 100% on room air Significant labs: (Labs/ Imaging personally reviewed) I, Tamara Whitehead, AGACNP-BC, personally viewed and interpreted this ECG. EKG Interpretation: Date: 04/20/2022, EKG Time: 20: 08, Rate: 86, Rhythm: NSR, additional evaluation challenging due to artifact Narrative Interpretation: NSR Chemistry: Na+: 141, K+: 3.9, BUN/Cr.:  14/0.93, Serum CO2/ AG: 20/8 Hematology: WBC: 12.7, Hgb: 10.1,  Troponin: 12  CXR 04/20/2022: Chronic right upper lobe scarring, no acute intrathoracic process X-ray right shoulder 04/20/2022: Humeral head fracture with displaced greater tuberosity, cannot exclude fracture involving the anatomic neck-CT may be helpful CT head Wo contrast 04/20/2022: Isodense left convexity subdural hematoma measuring 20 mm in thickness with rightward midline shift of 4 mm.  Hypodense chronic right subdural hematoma, 7 mm.   CT cervical spine without contrast 04/20/2022: no acute fracture or static subluxation of the cervical spine.  PCCM consulted for admission due to bilateral subdural hematomas with rightward 4 mm midline shift requiring urgent neurosurgical intervention and drain placement.  Pertinent  Medical History  Atrial flutter on Eliquis Fibrocystic breast disease HTN Hypothyroidism RA Hypercholesterolemia Glaucoma Cataract Skin cancer  Significant Hospital Events: Including procedures, antibiotic start and stop dates in addition to other pertinent events   04/20/2022: Admit to ICU with bilateral subdural hematomas and rightward 4 mm midline shift requiring urgent neurosurgical intervention and drain placement.  Interim History / Subjective:  Patient alert  and oriented in ED, no current complaints at this time.  Plan of care  discussed and all questions answered with son and sister bedside.  Objective   Blood pressure (!) 149/74, pulse 72, temperature 98.3 F (36.8 C), temperature source Oral, resp. rate (!) 22, height 5\' 1"  (1.549 m), weight 51 kg, SpO2 98 %.       No intake or output data in the 24 hours ending 04/20/22 2042 Filed Weights   04/20/22 1857  Weight: 51 kg    Examination: General: Adult female, acutely ill, lying in bed, NAD HEENT: MM pink/moist, anicteric, atraumatic, neck supple Neuro: A&O x 4, able to follow commands, R eye: +4/brisk & L eye +3/brisk, MAE, 5/5 LUE and LLE, 4/5 RLE -deferred RUE due to fracture > at least 2/5 per observation CV: s1s2 RRR, NSR on monitor, no r/m/g Pulm: Regular, non labored on room air, breath sounds clear-BUL & clear/diminished-BLL GI: soft, rounded, non tender, bs x 4 Skin: Limited exam-  no rashes/lesions noted Extremities: warm/dry, pulses + 2 R/P, no edema noted  Resolved Hospital Problem list     Assessment & Plan:  Traumatic bilateral subdural hematomas  Plan for emergent OR intervention with evacuation and drain placement Eliquis reversed in the ED with Andexxa -Neurosurgery following, appreciate input - Q 2 hour neuro checks  - remain flat post-operatively - BP goal < 180 - monitor drain output per neuro recommendations & orders  Traumatic Fracture of the RIGHT humeral head secondary to fall PTA - Orthopedics consulted, appreciate input - shoulder sling ordered  Anemia in the setting of Traumatic bilateral hematomas - Monitor for s/s of bleeding - Daily CBC - Transfuse for Hgb <7  Leukocytosis Suspect reactive, but patient is on immunosuppressant drugs, will need to monitor - daily CBC, monitor WBC/fever curve - check baseline PCT and trend  Hypertension Chronic / Paroxysmal Atrial Fibrillation/Flutter on Eliquis Hyperlipidemia Currently in sinus rhythm, CHA2DS2-VASc score: 4 - Continuous cardiac monitoring -SCDs overnight,  restart Eliquis once cleared by neurosurgery -Restart rosuvastatin -Consider restarting outpatient regimen: Losartan, metoprolol once patient has stabilized  Hypothyroidism -Restart outpatient regimen: levothyroxine  Rheumatoid arthritis -Hold outpatient regimen at this time due to leukocytosis -Consider restarting as patient stabilizes if appropriate: Humira and Plaquenil   Best Practice (right click and "Reselect all SmartList Selections" daily)  Diet/type: NPO w/ oral meds DVT prophylaxis: SCD GI prophylaxis: PPI Lines: N/A Foley:  N/A Code Status:  full code Last date of multidisciplinary goals of care discussion [04/20/22]  Labs   CBC: Recent Labs  Lab 04/20/22 1903  WBC 12.7*  HGB 10.1*  HCT 32.0*  MCV 95.2  PLT 361    Basic Metabolic Panel: Recent Labs  Lab 04/20/22 1903  NA 141  K 3.9  CL 113*  CO2 20*  GLUCOSE 111*  BUN 14  CREATININE 0.93  CALCIUM 8.9   GFR: Estimated Creatinine Clearance: 38.2 mL/min (by C-G formula based on SCr of 0.93 mg/dL). Recent Labs  Lab 04/20/22 1903  WBC 12.7*    Liver Function Tests: No results for input(s): "AST", "ALT", "ALKPHOS", "BILITOT", "PROT", "ALBUMIN" in the last 168 hours. No results for input(s): "LIPASE", "AMYLASE" in the last 168 hours. No results for input(s): "AMMONIA" in the last 168 hours.  ABG No results found for: "PHART", "PCO2ART", "PO2ART", "HCO3", "TCO2", "ACIDBASEDEF", "O2SAT"   Coagulation Profile: No results for input(s): "INR", "PROTIME" in the last 168 hours.  Cardiac Enzymes: No results for input(s): "CKTOTAL", "CKMB", "CKMBINDEX", "TROPONINI" in  the last 168 hours.  HbA1C: Hgb A1c MFr Bld  Date/Time Value Ref Range Status  12/12/2016 09:46 AM 5.3 4.6 - 6.5 % Final    Comment:    Glycemic Control Guidelines for People with Diabetes:Non Diabetic:  <6%Goal of Therapy: <7%Additional Action Suggested:  >8%     CBG: No results for input(s): "GLUCAP" in the last 168  hours.  Review of Systems: Positives in BOLD  Gen: Denies fever, chills, weight change, fatigue, Whitehead sweats HEENT: Denies blurred vision, double vision, hearing loss, tinnitus, sinus congestion, rhinorrhea, sore throat, neck stiffness, dysphagia PULM: Denies shortness of breath, cough, sputum production, hemoptysis, wheezing CV: Denies chest pain, edema, orthopnea, paroxysmal nocturnal dyspnea, palpitations GI: Denies abdominal pain, nausea, vomiting, diarrhea, hematochezia, melena, constipation, change in bowel habits GU: Denies dysuria, hematuria, polyuria, oliguria, urethral discharge Endocrine: Denies hot or cold intolerance, polyuria, polyphagia or appetite change Derm: Denies rash, dry skin, scaling or peeling skin change Heme: Denies easy bruising, bleeding, bleeding gums Neuro: Denies headache, numbness, weakness, word finding difficulties, gait disturbance, slurred speech, loss of memory or consciousness  Past Medical History:  She,  has a past medical history of Anxiety, Arthritis, Atrial flutter ([place]), Cancer ([place]), Cataract, Community acquired pneumonia (04/04/2019), Fibrocystic breast disease, Glaucoma, Hypercholesterolemia, Hypertension, Hypothyroidism, Rheumatoid arthritis ([place]), and Thyroid disease.   Surgical History:   Past Surgical History:  Procedure Laterality Date   ABDOMINAL HYSTERECTOMY     Total abd. hysterectomu and bilateral salping-oophorectomy   EYE SURGERY Right    Glaucoma surgery and cateract   HEMORRHOID SURGERY     status post dilatation and curettage  1975   TONSILLECTOMY     TONSILLECTOMY     TUBAL LIGATION     VIDEO BRONCHOSCOPY WITH ENDOBRONCHIAL NAVIGATION N/A 07/17/2019   Procedure: VIDEO BRONCHOSCOPY WITH ENDOBRONCHIAL NAVIGATION;  Surgeon: Tyler Pita, MD;  Location: [facility] ORS;  Service: Pulmonary;  Laterality: N/A;     Social History:   reports that she has been smoking cigarettes. She has a 15.00 pack-year smoking history. She has  never used smokeless tobacco. She reports that she does not currently use alcohol. She reports that she does not use drugs.   Family History:  Her family history includes Arthritis in her father and mother; CAD in her father and mother; Heart failure in her father and mother; Hypertension in her maternal grandmother. There is no history of COPD.   Allergies Allergies  Allergen Reactions   Codeine Nausea And Vomiting    Can take cough syrups   Diltiazem Rash          Home Medications  Prior to Admission medications   Medication Sig Start Date End Date Taking? Authorizing Provider  acetaminophen (TYLENOL) 500 MG tablet Take 1,000 mg by mouth every 6 (six) hours as needed for moderate pain.    [provider]  Adalimumab 40 MG/0.8ML PNKT Inject 40 mg into the skin every 14 (fourteen) days. 10/24/16   [provider]  Calcium Carbonate (CALCIUM 600 PO) Take 600 mg by mouth every other day.     [provider]  cetirizine (ZYRTEC) 10 MG tablet Take 10 mg by mouth daily.    [provider]  Coenzyme Q-10 100 MG capsule Take 100 mg by mouth daily.    [provider]  ELIQUIS 5 MG TABS tablet TAKE 1 TABLET TWICE DAILY 06/28/21   Dutch Quint B, FNP  hydroxychloroquine (PLAQUENIL) 200 MG tablet  09/06/20   [provider]  levothyroxine (SYNTHROID) 50 MCG tablet TAKE 1 TABLET EVERY DAY BEFORE BREAKFAST 06/28/21   Dutch Quint B, FNP  losartan (COZAAR) 50 MG tablet TAKE 1 TABLET EVERY DAY (NEED MD APPOINTMENT) 01/09/22   Dutch Quint B, FNP  metoprolol tartrate (LOPRESSOR) 25 MG tablet TAKE 1 TABLET TWICE DAILY (NEED MD APPOINTMENT FOR REFILLS) 01/09/22   Dutch Quint B, FNP  Multiple Vitamin (MULTIVITAMIN) tablet Take 1 tablet by mouth daily.    [provider]  rosuvastatin (CRESTOR) 10 MG tablet TAKE 1 TABLET EVERY DAY (NEED MD APPOINTMENT) 01/09/22   Dutch Quint B, FNP  sertraline (ZOLOFT) 50 MG tablet TAKE 2 TABLETS EVERY DAY (NEED  TO CALL AND SCHEDULE A FOLLOW UP FOR FURTHER REFILLS) 01/09/22   Kennyth Arnold, FNP     Critical care time: 60 minutes       Tamara Whitehead, AGACNP-BC Acute Care Nurse Practitioner [facility]   [phone] / [phone] Please see Amion for pager details.

## 2022-04-20 NOTE — ED Triage Notes (Signed)
Pt brought in via ems from home. Pt states she woke up this am and felt weak in the legs, unable to stand and confused.  Pt has a headache.  Pt reports she fell 3 weeks ago and hurt  right  shoulder   pt fell in a parking lot onto concrete. Did not see a doctor.  Limited rom of right arm.   No slurred speech   pt alert  speech clear.

## 2022-04-20 NOTE — ED Provider Notes (Signed)
[facility] Provider Note    Event Date/Time   First MD Initiated Contact with Patient 04/20/22 1952     (approximate)   History   Chief Complaint Weakness   HPI  Tamara Whitehead is a 76 y.o. female with past medical history of hypertension, hyperlipidemia, rheumatoid arthritis, atrial fibrillation on Eliquis, and hypothyroidism who presents to the ED complaining of weakness.  Patient reports that she initially fell 2 weeks ago while leaning over in a parking lot.  She reports losing her balance and falling forward onto her head as well as her right shoulder.  She did not lose consciousness and did not immediately seek medical care.  Son reports that she had significant bruising around her face and right arm following the fall, continue to take her usual Eliquis.  She has been dealing with significant pain in her right shoulder with difficulty lifting up her right arm.  She then reports starting to "feel off" 2 days ago.  When she woke up this morning, she noticed that she felt very unsteady on her feet when trying to walk.  She feels like both of her legs are weak, but weakness is worse on the right side.  She had 3 additional falls today but states they were "gentle," and she was able to lower herself to the ground without hitting her head.     Physical Exam   Triage Vital Signs: ED Triage Vitals  Enc Vitals Group     BP 04/20/22 1856 (!) 156/100     Pulse Rate 04/20/22 1856 77     Resp 04/20/22 1856 20     Temp 04/20/22 1856 98.3 F (36.8 C)     Temp Source 04/20/22 1856 Oral     SpO2 04/20/22 1856 100 %     Weight 04/20/22 1857 112 lb 7 oz (51 kg)     Height 04/20/22 1857 5\' 1"  (1.549 m)     Head Circumference --      Peak Flow --      Pain Score 04/20/22 1857 4     Pain Loc --      Pain Edu? --      Excl. in [place]? --     Most recent vital signs: Vitals:   04/20/22 1856 04/20/22 2030  BP: (!) 156/100 (!) 149/74  Pulse: 77 72  Resp: 20 (!) 22   Temp: 98.3 F (36.8 C)   SpO2: 100% 98%    Constitutional: Alert and oriented. Eyes: Conjunctivae are normal. Head: Atraumatic. Nose: No congestion/rhinnorhea. Mouth/Throat: Mucous membranes are moist.  Neck: No midline cervical spine tenderness to palpation. Cardiovascular: Normal rate, regular rhythm. Grossly normal heart sounds.  2+ radial pulses bilaterally. Respiratory: Normal respiratory effort.  No retractions. Lungs CTAB.  No chest wall tenderness to palpation. Gastrointestinal: Soft and nontender. No distention. Musculoskeletal: No lower extremity tenderness nor edema.  Diffuse tenderness to palpation of right shoulder with no obvious deformity.  No right elbow or wrist tenderness to palpation. Neurologic:  Normal speech and language.  Right lower extremity weakness noted, otherwise no focal neurologic deficits although examination of right upper extremity is limited due to shoulder injury.    ED Results / Procedures / Treatments   Labs (all labs ordered are listed, but only abnormal results are displayed) Labs Reviewed  BASIC METABOLIC PANEL - Abnormal; Notable for the following components:      Result Value   Chloride 113 (*)    CO2 20 (*)  Glucose, Bld 111 (*)    All other components within normal limits  CBC - Abnormal; Notable for the following components:   WBC 12.7 (*)    RBC 3.36 (*)    Hemoglobin 10.1 (*)    HCT 32.0 (*)    All other components within normal limits  PROTIME-INR - Abnormal; Notable for the following components:   Prothrombin Time 15.8 (*)    INR 1.3 (*)    All other components within normal limits  TROPONIN I (HIGH SENSITIVITY)     EKG  ED ECG REPORT I, Blake Divine, the attending physician, personally viewed and interpreted this ECG.   Date: 04/20/2022  EKG Time: 20:08  Rate: 86  Rhythm: normal sinus rhythm  Axis: Normal  Intervals:none  ST&T Change: None, interpretation limited due to artifact  RADIOLOGY CT head  reviewed and interpreted by me with left greater than right subdural hematoma, small amount of midline shift noted.  PROCEDURES:  Critical Care performed: Yes, see critical care procedure note(s)  .Critical Care  Performed by: Blake Divine, MD Authorized by: Blake Divine, MD   Critical care provider statement:    Critical care time (minutes):  30   Critical care time was exclusive of:  Separately billable procedures and treating other patients and teaching time   Critical care was necessary to treat or prevent imminent or life-threatening deterioration of the following conditions:  CNS failure or compromise   Critical care was time spent personally by me on the following activities:  Development of treatment plan with patient or surrogate, discussions with consultants, evaluation of patient's response to treatment, examination of patient, ordering and review of laboratory studies, ordering and review of radiographic studies, ordering and performing treatments and interventions, pulse oximetry, re-evaluation of patient's condition and review of old charts   I assumed direction of critical care for this patient from another provider in my specialty: no     Care discussed with: admitting provider      MEDICATIONS ORDERED IN ED: Medications  coag fact Xa recombinant (ANDEXXA) low dose infusion 900 mg (has no administration in time range)     IMPRESSION / MDM / ASSESSMENT AND PLAN / ED COURSE  I reviewed the triage vital signs and the nursing notes.                              76 y.o. female with past medical history of hypertension, hyperlipidemia, rheumatoid arthritis, atrial fibrillation on Eliquis, and hypothyroidism who presents to the ED for fall 2 weeks ago with right shoulder pain since then, now having multiple falls today with right leg weakness.  Patient's presentation is most consistent with acute presentation with potential threat to life or bodily  function.  Differential diagnosis includes, but is not limited to, intracranial hemorrhage, cervical spine injury, stroke, TIA, electrolyte abnormality, AKI, anemia.  Patient nontoxic-appearing and in no acute distress, vitals remarkable for hypertension but otherwise reassuring.  CT head performed from triage and is concerning for bilateral subdural hematoma, left greater than right with small apparent acute component on the left and 4 mm of associated midline shift.  No apparent cervical spine injury noted.  She does have a right-sided proximal humerus fracture with no evidence of dislocation.  Patient noted to have weakness in her right lower extremity, consistent with her CT findings.  We will reverse her anticoagulation as she last took Eliquis last night around 10:30 PM.  Case discussed with Dr. Cari Caraway of neurosurgery, who will evaluate the patient in the ED and tentatively plan for drain placement in the OR later this evening.  Plan to discuss with orthopedics as well as the ICU.  Shoulder injury reviewed with Dr. Sharlet Salina of orthopedics, who does not recommend any intervention beyond placing patient in a sling.  He will see the patient in the hospital tomorrow morning.  Case discussed with ICU team for admission.        FINAL CLINICAL IMPRESSION(S) / ED DIAGNOSES   Final diagnoses:  Subdural hematoma (HCC)  Closed fracture of proximal end of right humerus, unspecified fracture morphology, initial encounter  Right leg weakness     Rx / DC Orders   ED Discharge Orders     None        Note:  This document was prepared using Dragon voice recognition software and may include unintentional dictation errors.   Blake Divine, MD 04/20/22 2147

## 2022-04-20 NOTE — Telephone Encounter (Signed)
Patient called and stated she has a bad headache today, she only can take 2 or 3 steps and then she falls. Pt fell about 2 or 3 weeks ago and hurt her shoulder. No appointments available at office.

## 2022-04-20 NOTE — Op Note (Signed)
Indications: Tamara Whitehead is suffering from a chronic subdural hematoma causing significant brain compression and symptoms, prompting surgical intervention.   Findings: bilateral subdural hematoma, brain compression  Preoperative Diagnosis: Subdural hematoma Postoperative Diagnosis: same   EBL: 25 ml IVF: see AR ml Drains: subdural drain placed on left, subgaleal on R Disposition: Extubated and Stable to PACU Complications: none  A foley catheter was placed.   Preoperative Note:   Risks of surgery discussed include: infection, bleeding, stroke, coma, death, paralysis, CSF leak, weakness, need for further surgery, persistent symptoms, and the risks of anesthesia. The patient understood these risks and agreed to proceed.  Operative Note:   Procedure:  1) Bilateral frontal burr hole for drainage of subdural hematoma   Procedure: After obtaining informed consent, the patient taken to the operating room, placed in supine position, general anesthesia induced.  Her head was placed on a horseshoe.  A linear incision was marked 5 cm lateral to the midline on each side on the coronal suture.  The hair was clipped.  The operative site was prepped and draped.  A timeout was performed.    The incision was injected with local.  Each incision was made and carried to the skull.  The pericranium was reflected laterally and a small self-retaining retractor placed.  The drill was used to make a burr hole on each side.  The dura was coagulated, then divided with a #15 blade.  A membrane was encountered and opened.    The right side was opened first, where older subdural hematoma was encountered.  This was irrigated, then a subgaleal drain was placed.  The incision was closed with interrupted 3-0 vicryl and a 3-0 monocryl on the skin.    The dura on the left was opened.  Dark brown subdural fluid was encountered.  A bactiseal ventriculostomy catheter was placed into the subdural space, then tunneled  medially and posteriorly approximately 5 cm from the incision.  The connector was placed and secured.  Brisk drainage was noted.  The primary incision was irrigated, then closed with vicryl and monocryl.  The drain was secured to the skin on each side, then a 3-0 monocryl was placed for future closure of the drainage hole, and tacked to the drain with a steristrip.  The drainage system was flushed, then attached to the drain.  Good drainage was noted.    Sterile dressings were placed.  Sponge and pattie counts were correct at the end of the procedure.   Meade Maw MD

## 2022-04-20 NOTE — ED Notes (Signed)
Transport here for patient.

## 2022-04-20 NOTE — Transfer of Care (Signed)
Immediate Anesthesia Transfer of Care Note  Patient: Tamara Whitehead  Procedure(s) Performed: Haskell Flirt (Bilateral: Head)  Patient Location: PACU  Anesthesia Type:General  Level of Consciousness: awake  Airway & Oxygen Therapy: Patient Spontanous Breathing  Post-op Assessment: Report given to RN and Post -op Vital signs reviewed and stable  Post vital signs: Reviewed and stable  Last Vitals:  Vitals Value Taken Time  BP 97/73   Temp    Pulse 78 04/20/22 2345  Resp 19 04/20/22 2345  SpO2 95 % 04/20/22 2345  Vitals shown include unvalidated device data.  Last Pain:  Vitals:   04/20/22 1857  TempSrc:   PainSc: 4          Complications: No notable events documented.

## 2022-04-20 NOTE — Anesthesia Preprocedure Evaluation (Addendum)
Anesthesia Evaluation  Patient identified by MRN, date of birth, ID band Patient awake    Reviewed: Allergy & Precautions, NPO status , Patient's Chart, lab work & pertinent test results  Airway Mallampati: III  TM Distance: >3 FB Neck ROM: full    Dental  (+) Poor Dentition, Chipped, Missing   Pulmonary Current Smoker and Patient abstained from smoking.   Pulmonary exam normal        Cardiovascular hypertension, Normal cardiovascular exam+ dysrhythmias Atrial Fibrillation  Rhythm:Regular Rate:Normal     Neuro/Psych  PSYCHIATRIC DISORDERS Anxiety Depression    SDH    GI/Hepatic negative GI ROS, Neg liver ROS,,,  Endo/Other  Hypothyroidism    Renal/GU      Musculoskeletal  (+) Arthritis , Rheumatoid disorders,    Abdominal Normal abdominal exam  (+)   Peds  Hematology  (+) Blood dyscrasia, anemia   Anesthesia Other Findings Per ER Note: Pt fell 3 weeks ago with right shoulder pain and worsening weakness,confusion, and HA. Found to have bilateral SDH. Shortness of breath noted in ED. A CXR was performed without acute findings. Right shoulder with humerus fracture. Factor Xa order by surgeon.   CT Head: IMPRESSION: 1. Isodense left convexity subdural hematoma measuring 20 mm in thickness with rightward midline shift of 4 mm. 2. Hypodense chronic right subdural hematoma, 7 mm. 3. No acute fracture or static subluxation of the cervical spine.   Past Medical History: No date: Anxiety No date: Arthritis     Comment:  Osteoarthritis/Rheumatoid No date: Atrial flutter (HCC) No date: Cancer [facility])     Comment:  Skin No date: Cataract 04/04/2019: Community acquired pneumonia No date: Fibrocystic breast disease No date: Glaucoma No date: Hypercholesterolemia No date: Hypertension No date: Hypothyroidism No date: Rheumatoid arthritis ([place]) No date: Thyroid disease     Comment:  Hypothyroid  Past Surgical  History: No date: ABDOMINAL HYSTERECTOMY     Comment:  Total abd. hysterectomu and bilateral               salping-oophorectomy No date: EYE SURGERY; Right     Comment:  Glaucoma surgery and cateract No date: HEMORRHOID SURGERY 1975: status post dilatation and curettage No date: TONSILLECTOMY No date: TONSILLECTOMY No date: TUBAL LIGATION 07/17/2019: VIDEO BRONCHOSCOPY WITH ENDOBRONCHIAL NAVIGATION; N/A     Comment:  Procedure: VIDEO BRONCHOSCOPY WITH ENDOBRONCHIAL               NAVIGATION;  Surgeon: Tyler Pita, MD;  Location:               [facility] ORS;  Service: Pulmonary;  Laterality: N/A;  BMI    Body Mass Index: 21.24 kg/m      Reproductive/Obstetrics negative OB ROS                             Anesthesia Physical Anesthesia Plan  ASA: 4 and emergent  Anesthesia Plan: General ETT   Post-op Pain Management: Ofirmev IV (intra-op)* and Minimal or no pain anticipated   Induction: Intravenous  PONV Risk Score and Plan: 3 and Ondansetron, Dexamethasone and Propofol infusion  Airway Management Planned: Oral ETT  Additional Equipment:   Intra-op Plan:   Post-operative Plan: Extubation in OR  Informed Consent: I have reviewed the patients History and Physical, chart, labs and discussed the procedure including the risks, benefits and alternatives for the proposed anesthesia with the patient or authorized representative who has indicated his/her understanding and  acceptance.     Dental Advisory Given  Plan Discussed with: Anesthesiologist, CRNA and Surgeon  Anesthesia Plan Comments:         Anesthesia Quick Evaluation

## 2022-04-20 NOTE — H&P (View-Only) (Signed)
Consult requested by:  Dr. Charna Archer  Consult requested for:  Subdural hematoma  Primary Physician:  Leone Haven, MD  History of Present Illness: 04/20/2022 Tamara Whitehead is here today with a chief complaint of difficulty walking.  She had a substantial fall approximately 2 to 3 weeks ago when she was leaning over in a parking lot.  She lost her balance at the time and fell forward.  She did not lose consciousness and did not seek medical care.  She has had a worsening headache over the past week or so.  She reports that she began to feel off 2 to 3 days ago and had some difficulty with lifting her right arm.  This morning, she felt extremely unsteady on her feet and was unable to walk due to weakness in her right leg.  She had several falls.  She finally presented to the emergency department for evaluation where subdural hematoma was identified. I have utilized the care everywhere function in epic to review the outside records available from external health systems.  Review of Systems:  A 10 point review of systems is negative, except for the pertinent positives and negatives detailed in the HPI.  Past Medical History: Past Medical History:  Diagnosis Date   Anxiety    Arthritis    Osteoarthritis/Rheumatoid   Atrial flutter ([place])    Cancer ([place])    Skin   Cataract    Community acquired pneumonia 04/04/2019   Fibrocystic breast disease    Glaucoma    Hypercholesterolemia    Hypertension    Hypothyroidism    Rheumatoid arthritis ([place])    Thyroid disease    Hypothyroid    Past Surgical History: Past Surgical History:  Procedure Laterality Date   ABDOMINAL HYSTERECTOMY     Total abd. hysterectomu and bilateral salping-oophorectomy   EYE SURGERY Right    Glaucoma surgery and cateract   HEMORRHOID SURGERY     status post dilatation and curettage  1975   TONSILLECTOMY     TONSILLECTOMY     TUBAL LIGATION     VIDEO BRONCHOSCOPY WITH ENDOBRONCHIAL NAVIGATION N/A  07/17/2019   Procedure: VIDEO BRONCHOSCOPY WITH ENDOBRONCHIAL NAVIGATION;  Surgeon: Tyler Pita, MD;  Location: [facility] ORS;  Service: Pulmonary;  Laterality: N/A;    Allergies: Allergies as of 04/20/2022 - Review Complete 04/20/2022  Allergen Reaction Noted   Codeine Nausea And Vomiting 01/27/2015   Diltiazem Rash 06/20/2019    Medications: Current Meds  Medication Sig   acetaminophen (TYLENOL) 500 MG tablet Take 1,000 mg by mouth every 6 (six) hours as needed for moderate pain.   Calcium Carbonate (CALCIUM 600 PO) Take 600 mg by mouth every other day.    cetirizine (ZYRTEC) 10 MG tablet Take 10 mg by mouth daily.   Coenzyme Q-10 100 MG capsule Take 100 mg by mouth daily.   ELIQUIS 5 MG TABS tablet TAKE 1 TABLET TWICE DAILY   hydroxychloroquine (PLAQUENIL) 200 MG tablet    levothyroxine (SYNTHROID) 50 MCG tablet TAKE 1 TABLET EVERY DAY BEFORE BREAKFAST   losartan (COZAAR) 50 MG tablet TAKE 1 TABLET EVERY DAY (NEED MD APPOINTMENT)   metoprolol tartrate (LOPRESSOR) 25 MG tablet TAKE 1 TABLET TWICE DAILY (NEED MD APPOINTMENT FOR REFILLS)   Multiple Vitamin (MULTIVITAMIN) tablet Take 1 tablet by mouth daily.   rosuvastatin (CRESTOR) 10 MG tablet TAKE 1 TABLET EVERY DAY (NEED MD APPOINTMENT)   sertraline (ZOLOFT) 50 MG tablet TAKE 2 TABLETS EVERY DAY (NEED TO  CALL AND SCHEDULE A FOLLOW UP FOR FURTHER REFILLS)    Social History: Social History   Tobacco Use   Smoking status: Every Day    Packs/day: 0.50    Years: 30.00    Total pack years: 15.00    Types: Cigarettes   Smokeless tobacco: Never  Vaping Use   Vaping Use: Never used  Substance Use Topics   Alcohol use: Not Currently    Comment: rarely   Drug use: No    Family Medical History: Family History  Problem Relation Age of Onset   CAD Mother    Arthritis Mother        Rheumatoid Arthritis   Heart failure Mother    Arthritis Father        Rheumatoid/Osteoarthritis and Blindness, bleeding Tendencies,Glaucoma    CAD Father    Heart failure Father    Hypertension Maternal Grandmother    COPD Neg Hx     Physical Examination: Vitals:   04/20/22 2030 04/20/22 2100  BP: (!) 149/74 (!) 145/95  Pulse: 72 74  Resp: (!) 22 (!) 22  Temp:    SpO2: 98% 99%    General: Patient is well developed, well nourished, calm, collected, and in no apparent distress. Attention to examination is appropriate.  Neck:   Supple.  Full range of motion.  Respiratory: Patient is breathing without any difficulty.   NEUROLOGICAL:     Awake, alert, oriented to person, place, and time.  Speech is clear and fluent.   Cranial Nerves: Pupils equal round and reactive to light.  Facial tone is symmetric.  Facial sensation is symmetric. Shoulder shrug is symmetric. Tongue protrusion is midline. cannot test pronator drift due to right arm fracture  ROM of spine: full.    Strength: Side Biceps Triceps Deltoid Interossei Grip Wrist Ext. Wrist Flex.  R 4+ 4+ - 4+ 5 5 5   L 5 5 5 5 5 5 5    Side Iliopsoas Quads Hamstring PF DF EHL  R 3 4 4- 4- 5 5  L 5 5 5 5 5 5    Reflexes are 1+ and symmetric at the biceps, triceps, brachioradialis, patella and achilles.   Hoffman's is absent.   Bilateral upper and lower extremity sensation is intact to light touch.    No evidence of dysmetria noted.  Gait is untested.     Medical Decision Making  Imaging: CT Head 04/20/2022 IMPRESSION: 1. Isodense left convexity subdural hematoma measuring 20 mm in thickness with rightward midline shift of 4 mm. 2. Hypodense chronic right subdural hematoma, 7 mm. 3. No acute fracture or static subluxation of the cervical spine.   Critical Value/emergent results were called by telephone at the time of interpretation on 04/20/2022 at 7:44 pm to provider [facility] , who verbally acknowledged these results.     Electronically Signed   By: Ulyses Jarred M.D.   On: 04/20/2022 19:44  I have personally reviewed the images and agree with the  above interpretation.  Assessment and Plan: Tamara Whitehead is a pleasant 76 y.o. female with bilateral subdural hematomas.  These are chronic in nature.  The left-sided subdural is approximately 20 mm in thickness and causes 4 to 5 mm of left-to-right midline shift.  She has substantial brain compression.  She reports cognitive dysfunction, though she does have capacity.  She also has a right sided weakness due to her brain compression.  Given the size of her subdural hematoma and her neurologic symptoms, I feel that surgical  intervention is warranted.  I recommended bilateral bur hole drainage.  I discussed the planned procedure at length with the patient, including the risks, benefits, alternatives, and indications. The risks discussed include but are not limited to bleeding, infection, need for reoperation, spinal fluid leak, stroke, vision loss, anesthetic complication, coma, paralysis, and even death. I also described in detail that improvement was not guaranteed.  The patient expressed understanding of these risks, and asked that we proceed with surgery. I described the surgery in layman's terms, and gave ample opportunity for questions, which were answered to the best of my ability.  She took her Eliquis approximately 22 hours ago.  This case will be added on an emergent basis and she will have her anticoagulation reversed.  Given the necessity for anticoagulation, she is at somewhat heightened risk of postoperative complication.  She expressed understanding of this.   I have communicated my recommendations to the requesting physician and coordinated care to facilitate these recommendations.     Gervase Colberg K. Izora Ribas MD, [facility] Neurosurgery

## 2022-04-20 NOTE — ED Notes (Signed)
Report to [place], RN in [place]. She is to arrange for transport.

## 2022-04-21 ENCOUNTER — Telehealth: Payer: Self-pay

## 2022-04-21 ENCOUNTER — Encounter: Payer: Self-pay | Admitting: Neurosurgery

## 2022-04-21 ENCOUNTER — Inpatient Hospital Stay: Payer: Medicare HMO

## 2022-04-21 DIAGNOSIS — S065XAA Traumatic subdural hemorrhage with loss of consciousness status unknown, initial encounter: Secondary | ICD-10-CM

## 2022-04-21 LAB — CBC
HCT: 27.5 % — ABNORMAL LOW (ref 36.0–46.0)
Hemoglobin: 9.1 g/dL — ABNORMAL LOW (ref 12.0–15.0)
MCH: 29.9 pg (ref 26.0–34.0)
MCHC: 33.1 g/dL (ref 30.0–36.0)
MCV: 90.5 fL (ref 80.0–100.0)
Platelets: 237 10*3/uL (ref 150–400)
RBC: 3.04 MIL/uL — ABNORMAL LOW (ref 3.87–5.11)
RDW: 14.6 % (ref 11.5–15.5)
WBC: 15.5 10*3/uL — ABNORMAL HIGH (ref 4.0–10.5)
nRBC: 0 % (ref 0.0–0.2)

## 2022-04-21 LAB — BASIC METABOLIC PANEL
Anion gap: 6 (ref 5–15)
BUN: 14 mg/dL (ref 8–23)
CO2: 21 mmol/L — ABNORMAL LOW (ref 22–32)
Calcium: 8.2 mg/dL — ABNORMAL LOW (ref 8.9–10.3)
Chloride: 114 mmol/L — ABNORMAL HIGH (ref 98–111)
Creatinine, Ser: 0.95 mg/dL (ref 0.44–1.00)
GFR, Estimated: >60 mL/min (ref >60)
Glucose, Bld: 167 mg/dL — ABNORMAL HIGH (ref 70–99)
Potassium: 4.2 mmol/L (ref 3.5–5.1)
Sodium: 141 mmol/L (ref 135–145)

## 2022-04-21 LAB — MAGNESIUM: Magnesium: 2 mg/dL (ref 1.7–2.4)

## 2022-04-21 LAB — HEMOGLOBIN A1C
Hgb A1c MFr Bld: 5.7 % — ABNORMAL HIGH (ref 4.8–5.6)
Mean Plasma Glucose: 117 mg/dL

## 2022-04-21 LAB — GLUCOSE, CAPILLARY
Glucose-Capillary: 116 mg/dL — ABNORMAL HIGH (ref 70–99)
Glucose-Capillary: 151 mg/dL — ABNORMAL HIGH (ref 70–99)
Glucose-Capillary: 99 mg/dL (ref 70–99)

## 2022-04-21 LAB — PROCALCITONIN: Procalcitonin: <0.1 ng/mL

## 2022-04-21 LAB — PHOSPHORUS: Phosphorus: 3.4 mg/dL (ref 2.5–4.6)

## 2022-04-21 LAB — MRSA NEXT GEN BY PCR, NASAL: MRSA by PCR Next Gen: NOT DETECTED

## 2022-04-21 MED ORDER — CHLORHEXIDINE GLUCONATE CLOTH 2 % EX PADS
6.0000 | MEDICATED_PAD | Freq: Every day | CUTANEOUS | Status: DC
  Administered 2022-04-22 – 2022-04-23 (×2): 6 via TOPICAL

## 2022-04-21 MED ORDER — LACTATED RINGERS IV BOLUS
1000.0000 mL | Freq: Once | INTRAVENOUS | Status: AC
  Administered 2022-04-21: 1000 mL via INTRAVENOUS

## 2022-04-21 MED ORDER — INSULIN ASPART 100 UNIT/ML IJ SOLN
0.0000 [IU] | Freq: Three times a day (TID) | INTRAMUSCULAR | Status: DC
  Administered 2022-04-21: 2 [IU] via SUBCUTANEOUS
  Administered 2022-04-22 (×2): 1 [IU] via SUBCUTANEOUS
  Filled 2022-04-21 (×3): qty 1

## 2022-04-21 MED ORDER — MIDODRINE HCL 5 MG PO TABS
10.0000 mg | ORAL_TABLET | Freq: Three times a day (TID) | ORAL | Status: DC
  Administered 2022-04-21: 10 mg via ORAL
  Filled 2022-04-21 (×2): qty 2

## 2022-04-21 MED ORDER — 0.9 % SODIUM CHLORIDE (POUR BTL) OPTIME
TOPICAL | Status: DC | PRN
  Administered 2022-04-20: 120 mL

## 2022-04-21 MED ORDER — ORAL CARE MOUTH RINSE: 15.0000 mL | OROMUCOSAL | Status: DC | PRN

## 2022-04-21 MED ORDER — SERTRALINE HCL 50 MG PO TABS
100.0000 mg | ORAL_TABLET | Freq: Every day | ORAL | Status: DC
  Filled 2022-04-21: qty 2

## 2022-04-21 MED ORDER — NOREPINEPHRINE 4 MG/250ML-% IV SOLN
2.0000 ug/min | INTRAVENOUS | Status: DC
  Administered 2022-04-21: 2 ug/min via INTRAVENOUS
  Filled 2022-04-21: qty 250

## 2022-04-21 MED ORDER — OXYCODONE HCL 5 MG PO TABS
ORAL_TABLET | ORAL | Status: AC
  Filled 2022-04-21: qty 1

## 2022-04-21 MED ORDER — LEVOTHYROXINE SODIUM 50 MCG PO TABS
50.0000 ug | ORAL_TABLET | Freq: Every day | ORAL | Status: DC
  Administered 2022-04-22 – 2022-04-23 (×2): 50 ug via ORAL
  Filled 2022-04-21 (×2): qty 1

## 2022-04-21 MED ORDER — SODIUM CHLORIDE 0.9 % IV SOLN: 250.0000 mL | INTRAVENOUS | Status: DC

## 2022-04-21 MED ORDER — INSULIN ASPART 100 UNIT/ML IJ SOLN: 0.0000 [IU] | Freq: Every day | INTRAMUSCULAR | Status: DC

## 2022-04-21 MED ORDER — OXYCODONE-ACETAMINOPHEN 5-325 MG PO TABS
1.0000 | ORAL_TABLET | Freq: Four times a day (QID) | ORAL | Status: DC | PRN
  Administered 2022-04-21: 1 via ORAL
  Administered 2022-04-21: 2 via ORAL
  Administered 2022-04-22: 1 via ORAL
  Administered 2022-04-22: 2 via ORAL
  Administered 2022-04-22 – 2022-04-23 (×2): 1 via ORAL
  Filled 2022-04-21 (×3): qty 1
  Filled 2022-04-21: qty 2
  Filled 2022-04-21: qty 1
  Filled 2022-04-21: qty 2

## 2022-04-21 MED ORDER — LACTATED RINGERS IV SOLN: INTRAVENOUS | Status: AC

## 2022-04-21 NOTE — Anesthesia Postprocedure Evaluation (Signed)
Anesthesia Post Note  Patient: Tamara Whitehead  Procedure(s) Performed: Haskell Flirt (Bilateral: Head)  Patient location during evaluation: ICU Anesthesia Type: General Level of consciousness: awake and alert Pain management: pain level controlled Vital Signs Assessment: post-procedure vital signs reviewed and stable Respiratory status: spontaneous breathing, nonlabored ventilation, respiratory function stable and patient connected to nasal cannula oxygen Cardiovascular status: blood pressure returned to baseline and stable Postop Assessment: no apparent nausea or vomiting Anesthetic complications: no  No notable events documented.   Last Vitals:  Vitals:   04/21/22 0600 04/21/22 0630  BP: 93/70 105/71  Pulse: 77 63  Resp: (!) 24 17  Temp:    SpO2: 94% 93%    Last Pain:  Vitals:   04/21/22 0400  TempSrc: Oral  PainSc: 0-No pain                 Cris, [place]

## 2022-04-21 NOTE — TOC Initial Note (Addendum)
Transition of Care [facility]) - Initial/Assessment Note    Patient Details  Name: Tamara Whitehead MRN: [id number] Date of Birth: [birth year]  [facility]) CM/SW Contact:    Shelbie Hutching, RN Phone Number: 04/21/2022, 2:26 PM  Clinical Narrative:                 Patient admitted to the hospital with subdural hematoma, s/p neurosurgery intervention with burr hole drainage.  RNCM met with patient at the bedside today.  Patient says she is feeling okay but has a headache.  She is from home and she and her son live together.  They just last week moved into a duplex in [place].  She reports that she sold her home, she just couldn't keep up with the maintenance and the expense.   Patient reports that she walks with a cane at home.  She is interested in rehab at discharge.  Patient will need PT and OT evals when approved by MD's.  Patient reports she has had home health in the past but can't remember the name of the company.  Reviewing old notes patient was set up with [facility] back in 2020.    TOC will cont to follow.   Expected Discharge Plan: Skilled Nursing Facility Barriers to Discharge: Continued Medical Work up   Patient Goals and CMS Choice Patient states their goals for this hospitalization and ongoing recovery are:: Patient would like to get some skilled nursing home rehab CMS Medicare.gov Compare Post Acute Care list provided to:: Patient Choice offered to / list presented to : Patient  Expected Discharge Plan and Services Expected Discharge Plan: [place]   Discharge Planning Services: CM Consult Post Acute Care Choice: [place] Living arrangements for the past 2 months: Apartment                                      Prior Living Arrangements/Services Living arrangements for the past 2 months: Apartment Lives with:: Adult Children Patient language and need for interpreter reviewed:: Yes Do you feel safe going back to the place  where you live?: Yes      Need for Family Participation in Patient Care: Yes (Comment) Care giver support system in place?: Yes (comment) Current home services: DME (cane) Criminal Activity/Legal Involvement Pertinent to Current Situation/Hospitalization: No - Comment as needed  Activities of Daily Living      Permission Sought/Granted Permission sought to share information with : Case Manager, Family Supports Permission granted to share information with : Yes, Verbal Permission Granted  Share Information with NAME: Tamara Whitehead     Permission granted to share info w Relationship: son  Permission granted to share info w Contact Information: [phone]  Emotional Assessment Appearance:: Appears stated age Attitude/Demeanor/Rapport: Engaged Affect (typically observed): Accepting Orientation: : Oriented to Self, Oriented to Place, Oriented to  Time, Oriented to Situation Alcohol / Substance Use: Not Applicable Psych Involvement: No (comment)  Admission diagnosis:  Subdural hematoma ([place]) [S06.5XAA] Right leg weakness [R29.898] Closed fracture of proximal end of right humerus, unspecified fracture morphology, initial encounter [S42.201A] Patient Active Problem List   Diagnosis Date Noted   Subdural hematoma ([place]) 04/20/2022   Compression of brain ([place]) 04/20/2022   Anxiety and depression 08/24/2020   Hypertension    Rash 05/09/2019   Malnutrition of moderate degree 04/08/2019   Atrial flutter ([place])    Immunosuppressed status ([place])  Skin lesion 11/07/2016   Allergic rhinitis 01/27/2015   Current tobacco use 01/27/2015   Hypercholesteremia 01/27/2015   Adult hypothyroidism 01/27/2015   Osteoarthrosis 01/27/2015   Malignant neoplasm of skin 01/27/2015   Rheumatoid arthritis ([place]) 10/22/2012   PCP:  Leone Haven, MD Pharmacy:   [phone] [address], [place] [address] Phone: [phone] Fax:  [id number] [address], [place] - [address] [place] [address] Phone: [phone] Fax: [phone]  [facility] [place], [place] [address] Phone: [phone] Fax: [phone]     Social Determinants of Health (SDOH) Interventions    Readmission Risk Interventions     No data to display

## 2022-04-21 NOTE — Consult Note (Signed)
ORTHOPAEDIC CONSULTATION  REQUESTING PHYSICIAN: Flora Lipps, MD  Chief Complaint: Right shoulder pain (also with difficulty with balance and upper extremity motor weakness)  HPI: Tamara Whitehead is a 76 y.o. female who had a fall approximately 3 weeks ago with subsequent right shoulder pain as well as progressive weakness and balance troubles, found to have a subdural hematoma taken urgently for burr decompression last night.  Orthopedics was consulted regarding x-ray findings of right shoulder displaced greater tuberosity fracture.  Patient is right-hand-dominant.  Past Medical History:  Diagnosis Date   Anxiety    Arthritis    Osteoarthritis/Rheumatoid   Atrial flutter ([place])    Cancer ([place])    Skin   Cataract    Community acquired pneumonia 04/04/2019   Fibrocystic breast disease    Glaucoma    Hypercholesterolemia    Hypertension    Hypothyroidism    Rheumatoid arthritis ([place])    Thyroid disease    Hypothyroid   Past Surgical History:  Procedure Laterality Date   ABDOMINAL HYSTERECTOMY     Total abd. hysterectomu and bilateral salping-oophorectomy   EYE SURGERY Right    Glaucoma surgery and cateract   HEMORRHOID SURGERY     status post dilatation and curettage  1975   TONSILLECTOMY     TONSILLECTOMY     TUBAL LIGATION     VIDEO BRONCHOSCOPY WITH ENDOBRONCHIAL NAVIGATION N/A 07/17/2019   Procedure: VIDEO BRONCHOSCOPY WITH ENDOBRONCHIAL NAVIGATION;  Surgeon: Tyler Pita, MD;  Location: [facility] ORS;  Service: Pulmonary;  Laterality: N/A;   Social History   Socioeconomic History   Marital status: Divorced    Spouse name: Not on file   Number of children: Not on file   Years of education: Not on file   Highest education level: Not on file  Occupational History   Not on file  Tobacco Use   Smoking status: Every Day    Packs/day: 0.50    Years: 30.00    Total pack years: 15.00    Types: Cigarettes   Smokeless tobacco: Never  Vaping Use   Vaping Use:  Never used  Substance and Sexual Activity   Alcohol use: Not Currently    Comment: rarely   Drug use: No   Sexual activity: Not on file  Other Topics Concern   Not on file  Social History Narrative   Not on file   Social Determinants of Health   Financial Resource Strain: Low Risk  (01/19/2022)   Overall Financial Resource Strain (CARDIA)    Difficulty of Paying Living Expenses: Not very hard  Food Insecurity: No Food Insecurity (01/19/2022)   Hunger Vital Sign    Worried About Running Out of Food in the Last Year: Never true    Ran Out of Food in the Last Year: Never true  Transportation Needs: No Transportation Needs (01/19/2022)   PRAPARE - Hydrologist (Medical): No    Lack of Transportation (Non-Medical): No  Physical Activity: Sufficiently Active (01/19/2022)   Exercise Vital Sign    Days of Exercise per Week: 5 days    Minutes of Exercise per Session: 30 min  Stress: No Stress Concern Present (01/19/2022)   [place]    Feeling of Stress : Only a little  Social Connections: Unknown (01/19/2022)   Social Connection and Isolation Panel [NHANES]    Frequency of Communication with Friends and Family: Not on file    Frequency of Social  Gatherings with Friends and Family: More than three times a week    Attends Religious Services: Not on file    Active Member of Clubs or Organizations: Not on file    Attends Archivist Meetings: Not on file    Marital Status: Not on file   Family History  Problem Relation Age of Onset   CAD Mother    Arthritis Mother        Rheumatoid Arthritis   Heart failure Mother    Arthritis Father        Rheumatoid/Osteoarthritis and Blindness, bleeding Tendencies,Glaucoma   CAD Father    Heart failure Father    Hypertension Maternal Grandmother    COPD Neg Hx    Allergies  Allergen Reactions   Codeine Nausea And Vomiting    Can take cough  syrups   Diltiazem Rash        Prior to Admission medications   Medication Sig Start Date End Date Taking? Authorizing Provider  acetaminophen (TYLENOL) 500 MG tablet Take 1,000 mg by mouth every 6 (six) hours as needed for moderate pain.   Yes [provider]  Calcium Carbonate (CALCIUM 600 PO) Take 600 mg by mouth every other day.    Yes [provider]  cetirizine (ZYRTEC) 10 MG tablet Take 10 mg by mouth daily.   Yes [provider]  Coenzyme Q-10 100 MG capsule Take 100 mg by mouth daily.   Yes [provider]  ELIQUIS 5 MG TABS tablet TAKE 1 TABLET TWICE DAILY 06/28/21  Yes Dutch Quint B, FNP  hydroxychloroquine (PLAQUENIL) 200 MG tablet  09/06/20  Yes [provider]  levothyroxine (SYNTHROID) 50 MCG tablet TAKE 1 TABLET EVERY DAY BEFORE BREAKFAST 06/28/21  Yes Dutch Quint B, FNP  losartan (COZAAR) 50 MG tablet TAKE 1 TABLET EVERY DAY (NEED MD APPOINTMENT) 01/09/22  Yes Dutch Quint B, FNP  metoprolol tartrate (LOPRESSOR) 25 MG tablet TAKE 1 TABLET TWICE DAILY (NEED MD APPOINTMENT FOR REFILLS) 01/09/22  Yes Dutch Quint B, FNP  Multiple Vitamin (MULTIVITAMIN) tablet Take 1 tablet by mouth daily.   Yes [provider]  rosuvastatin (CRESTOR) 10 MG tablet TAKE 1 TABLET EVERY DAY (NEED MD APPOINTMENT) 01/09/22  Yes Dutch Quint B, FNP  sertraline (ZOLOFT) 50 MG tablet TAKE 2 TABLETS EVERY DAY (NEED TO CALL AND SCHEDULE A FOLLOW UP FOR FURTHER REFILLS) 01/09/22  Yes Dutch Quint B, FNP  Adalimumab 40 MG/0.8ML PNKT Inject 40 mg into the skin every 14 (fourteen) days. 10/24/16   [provider]   CT HEAD WO CONTRAST (5MM)  Result Date: 04/21/2022 CLINICAL DATA:  Subdural hematoma follow-up EXAM: CT HEAD WITHOUT CONTRAST TECHNIQUE: Contiguous axial images were obtained from the base of the skull through the vertex without intravenous contrast. RADIATION DOSE REDUCTION: This exam was performed according to the departmental  dose-optimization program which includes automated exposure control, adjustment of the mA and/or kV according to patient size and/or use of iterative reconstruction technique. COMPARISON:  04/20/2022 FINDINGS: Brain: First scan after burr hole drainage of bilateral subdural hematoma. The dominant remaining pocket is along the high right frontal parietal convexity at 11 mm thickness. Small volume acute blood along the left posterior falx. Bilateral drains, terminating in the scalp on the right and in the subdural space on the left. No complicating infarct or hydrocephalus. Expected pneumocephalus. Vascular: No hyperdense vessel or unexpected calcification. Skull: Unremarkable burr holes. Sinuses/Orbits: No acute finding. IMPRESSION: Evacuated subdural hematomas with resolved cerebral mass effect.  The dominant remaining pocket is at the right frontal parietal convexity measuring up to 11 mm in thickness. Small area of acute blood along the posterior left falx. Electronically Signed   By: Jorje Guild M.D.   On: 04/21/2022 05:50   DG Chest Portable 1 View  Result Date: 04/20/2022 CLINICAL DATA:  Short of breath, weakness, fell 3-4 weeks ago EXAM: PORTABLE CHEST 1 VIEW COMPARISON:  10/16/2019 FINDINGS: Single frontal view of the chest demonstrates an unremarkable cardiac silhouette. Chronic right upper lobe scarring. No airspace disease, effusion, or pneumothorax. No acute displaced fracture. IMPRESSION: 1. No acute intrathoracic process. Electronically Signed   By: Randa Ngo M.D.   On: 04/20/2022 21:00   CT Head Wo Contrast  Result Date: 04/20/2022 CLINICAL DATA:  New onset headache EXAM: CT HEAD WITHOUT CONTRAST CT CERVICAL SPINE WITHOUT CONTRAST TECHNIQUE: Multidetector CT imaging of the head and cervical spine was performed following the standard protocol without intravenous contrast. Multiplanar CT image reconstructions of the cervical spine were also generated. RADIATION DOSE REDUCTION: This exam  was performed according to the departmental dose-optimization program which includes automated exposure control, adjustment of the mA and/or kV according to patient size and/or use of iterative reconstruction technique. COMPARISON:  None Available. FINDINGS: CT HEAD FINDINGS Brain: There is an isodense left convexity subdural hematoma 20 mm in thickness. Likely small acute component anteriorly (axial image 13). There is a hypodense chronic right subdural hematoma, 7 mm. There is rightward midline shift of 4 mm. Vascular: No abnormal hyperdensity of the major intracranial arteries or dural venous sinuses. No intracranial atherosclerosis. Skull: The visualized skull base, calvarium and extracranial soft tissues are normal. Sinuses/Orbits: No fluid levels or advanced mucosal thickening of the visualized paranasal sinuses. No mastoid or middle ear effusion. The orbits are normal. CT CERVICAL SPINE FINDINGS Alignment: Reversal of normal cervical lordosis Skull base and vertebrae: No acute fracture. Soft tissues and spinal canal: No prevertebral fluid or swelling. No visible canal hematoma. Disc levels: Multilevel facet arthrosis Upper chest: No pneumothorax, pulmonary nodule or pleural effusion. Other: Normal visualized paraspinal cervical soft tissues. IMPRESSION: 1. Isodense left convexity subdural hematoma measuring 20 mm in thickness with rightward midline shift of 4 mm. 2. Hypodense chronic right subdural hematoma, 7 mm. 3. No acute fracture or static subluxation of the cervical spine. Critical Value/emergent results were called by telephone at the time of interpretation on 04/20/2022 at 7:44 pm to provider [facility] , who verbally acknowledged these results. Electronically Signed   By: Ulyses Jarred M.D.   On: 04/20/2022 19:44   CT CERVICAL SPINE WO CONTRAST  Result Date: 04/20/2022 CLINICAL DATA:  New onset headache EXAM: CT HEAD WITHOUT CONTRAST CT CERVICAL SPINE WITHOUT CONTRAST TECHNIQUE:  Multidetector CT imaging of the head and cervical spine was performed following the standard protocol without intravenous contrast. Multiplanar CT image reconstructions of the cervical spine were also generated. RADIATION DOSE REDUCTION: This exam was performed according to the departmental dose-optimization program which includes automated exposure control, adjustment of the mA and/or kV according to patient size and/or use of iterative reconstruction technique. COMPARISON:  None Available. FINDINGS: CT HEAD FINDINGS Brain: There is an isodense left convexity subdural hematoma 20 mm in thickness. Likely small acute component anteriorly (axial image 13). There is a hypodense chronic right subdural hematoma, 7 mm. There is rightward midline shift of 4 mm. Vascular: No abnormal hyperdensity of the major intracranial arteries or dural venous sinuses. No intracranial atherosclerosis. Skull: The visualized skull base, calvarium  and extracranial soft tissues are normal. Sinuses/Orbits: No fluid levels or advanced mucosal thickening of the visualized paranasal sinuses. No mastoid or middle ear effusion. The orbits are normal. CT CERVICAL SPINE FINDINGS Alignment: Reversal of normal cervical lordosis Skull base and vertebrae: No acute fracture. Soft tissues and spinal canal: No prevertebral fluid or swelling. No visible canal hematoma. Disc levels: Multilevel facet arthrosis Upper chest: No pneumothorax, pulmonary nodule or pleural effusion. Other: Normal visualized paraspinal cervical soft tissues. IMPRESSION: 1. Isodense left convexity subdural hematoma measuring 20 mm in thickness with rightward midline shift of 4 mm. 2. Hypodense chronic right subdural hematoma, 7 mm. 3. No acute fracture or static subluxation of the cervical spine. Critical Value/emergent results were called by telephone at the time of interpretation on 04/20/2022 at 7:44 pm to provider [facility] , who verbally acknowledged these results.  Electronically Signed   By: Ulyses Jarred M.D.   On: 04/20/2022 19:44   DG Shoulder Right  Result Date: 04/20/2022 CLINICAL DATA:  Fall EXAM: RIGHT SHOULDER - 2+ VIEW COMPARISON:  None Available. FINDINGS: There is a fracture of the humeral head with displacement of the greater tuberosity superiorly and medially. The fracture may also involve the anatomic neck. CT might be helpful for further definition of this finding. IMPRESSION: Humeral head fracture with displaced greater tuberosity. Cannot exclude a fracture involving the anatomic neck, and CT might be helpful. Electronically Signed   By: Sammie Bench M.D.   On: 04/20/2022 19:32    Positive ROS: All other systems have been reviewed and were otherwise negative with the exception of those mentioned in the HPI and as above.  Physical Exam: General: Alert, no acute distress Cardiovascular: No pedal edema Respiratory: No cyanosis, no use of accessory musculature GI: No organomegaly, abdomen is soft and non-tender Skin: No lesions in the area of chief complaint Neurologic: Sensation intact distally Psychiatric: Patient is competent for consent with normal mood and affect Lymphatic: No axillary or cervical lymphadenopathy  MUSCULOSKELETAL:  Right upper extremity: Able to flex and extend appropriately at the elbow wrist and hand, shoulder range of motion deferred, sensory intact the right upper extremity  Assessment: 76 year old right-hand-dominant female admitted with bilateral subdural hematomas now status post bur decompression, also found to have a displaced right shoulder greater tuberosity fracture after fall approximately 3 weeks ago.  There are no surgical indications for her right shoulder.  Plan: -Continue sling use for the right upper extremity.  Okay for elbow wrist and hand range of motion and okay to progress with passive shoulder motion to tolerance -Recommend outpatient follow-up in approximately 3 weeks with repeat  shoulder x-rays    Renee Harder, MD    04/21/2022 8:19 AM

## 2022-04-21 NOTE — Telephone Encounter (Signed)
Patient is currently hospitalized for this issue.

## 2022-04-21 NOTE — Progress Notes (Signed)
NAME:  Tamara Whitehead, MRN:  [id number], DOB:  1946-03-06, LOS: 1 ADMISSION DATE:  04/20/2022, CONSULTATION DATE: 04/20/2022 REFERRING MD: Dr. Charna Archer, CHIEF COMPLAINT:   Weakness  History of Present Illness:  76 year old female presenting to [facility] ED from home on 04/20/2022 with complaints of weakness, headache, and recent falls.  History provided by patient bedside. She reports falling 3 weeks ago and a parking garage on the asphalt.  She recently sold her house and was moving items to a new location.  Son showed me a picture post fall and she had ecchymosis under both eyes and abrasion on her chin and bruising all over her upper right arm with some facial swelling.  She denies losing consciousness and had refused to go to the ED or urgent care to be checked out.  On 04/19/2022 patient and family members noticed some mild balance issues, headache and word finding difficulties.  The symptoms continued to worsen on 04/20/2022.  Her gait and balance became so poor she fell 3 times at home.  She denies hitting her head with any of these recent falls stating she "sat down hard on the floor".  Her headache comes and goes at worst 8/10, with no aggravating or alleviating factors.  She does also admit to intermittent discomfort in the right upper arm. She denies blurred vision, syncope, dizziness, nausea/ vomiting.  She has been taking her Eliquis consistently for a recent diagnosis of a flutter.  ED course: Upon arrival patient is alert and oriented, but reports word finding difficulty.  Vital signs stable with mild hypertension.  Imaging revealed RIGHT humeral head fracture with displaced greater tuberosity.  CT imaging consistent with bilateral subdural hematomas left greater than right with a 4 mm rightward midline shift.  Lab work significant for mild NAGMA, slightly elevated chloride, leukocytosis, mild anemia and elevated INR (consistent with DOAC use). Orthopedics consulted by EDP, see recommendations in  plan of care. Neurosurgery consulted and the plan is to take her directly to the OR for evacuation and drain placement, request for ICU admission. Medications given: Andexxa 900 mg Initial Vitals: 98.3, 20, 77, 156/101 100% on room air Significant labs: (Labs/ Imaging personally reviewed) I, Domingo Pulse Rust[place], AGACNP-BC, personally viewed and interpreted this ECG. EKG Interpretation: Date: 04/20/2022, EKG Time: 20: 08, Rate: 86, Rhythm: NSR, additional evaluation challenging due to artifact Narrative Interpretation: NSR Chemistry: Na+: 141, K+: 3.9, BUN/Cr.:  14/0.93, Serum CO2/ AG: 20/8 Hematology: WBC: 12.7, Hgb: 10.1,  Troponin: 12  CXR 04/20/2022: Chronic right upper lobe scarring, no acute intrathoracic process X-ray right shoulder 04/20/2022: Humeral head fracture with displaced greater tuberosity, cannot exclude fracture involving the anatomic neck-CT may be helpful CT head Wo contrast 04/20/2022: Isodense left convexity subdural hematoma measuring 20 mm in thickness with rightward midline shift of 4 mm.  Hypodense chronic right subdural hematoma, 7 mm.   CT cervical spine without contrast 04/20/2022: no acute fracture or static subluxation of the cervical spine.  PCCM consulted for admission due to bilateral subdural hematomas with rightward 4 mm midline shift requiring urgent neurosurgical intervention and drain placement.  Pertinent  Medical History  Atrial flutter on Eliquis Fibrocystic breast disease HTN Hypothyroidism RA Hypercholesterolemia Glaucoma Cataract Skin cancer  Significant Hospital Events: Including procedures, antibiotic start and stop dates in addition to other pertinent events   04/20/2022: Admit to ICU with bilateral subdural hematomas and rightward 4 mm midline shift requiring urgent neurosurgical intervention and drain placement. 11/30 drains in place, alert and awake  Interim History / Subjective:  +shoulder pain +drains in place Alert and  awake   Objective   Blood pressure 105/71, pulse 63, temperature 97.9 F (36.6 C), temperature source Oral, resp. rate 17, height 5\' 1"  (1.549 m), weight 57.7 kg, SpO2 93 %.        Intake/Output Summary (Last 24 hours) at 04/21/2022 0814 Last data filed at 04/21/2022 0600 Gross per 24 hour  Intake 2180.98 ml  Output 1189 ml  Net 991.98 ml   Filed Weights   04/20/22 1857 04/21/22 0026 04/21/22 0200  Weight: 51 kg 57.7 kg 57.7 kg    Examination: General: Adult female, acutely ill, lying in bed, NAD Neuro: A&O x 4, able to follow commands, R eye: +4/brisk & L eye +3/brisk, MAE, 5/5 LUE and LLE, 4/5 RLE -deferred RUE due to fracture > at least 2/5 per observation CV: s1s2 RRR, NSR on monitor, no r/m/g Pulm: Regular, non labored on room air, breath sounds clear-BUL & clear/diminished-BLL GI: soft, rounded, non tender, bs x 4 Skin: Limited exam-  no rashes/lesions noted Extremities: warm/dry, pulses + 2 R/P, no edema noted    Assessment & Plan:  Traumatic bilateral subdural hematomas REQUIRING ICU S/p  evacuation and drain placement Eliquis reversed in the ED with Andexxa -Neurosurgery following, appreciate input - Q 2 hour neuro checks  - remain flat post-operatively - BP goal < 180 - monitor drain output per neuro recommendations & orders  Traumatic Fracture of the RIGHT humeral head secondary to fall PTA - Orthopedics consulted, appreciate input - shoulder sling ordered  ACUTE ANEMIA- TRANSFUSE AS NEEDED CONSIDER TRANSFUSION  IF HGB<7 DVT PRX with TED/SCD's ONLY Anemia in the setting of Traumatic bilateral hematomas - Monitor for s/s of bleeding - Daily CBC - Transfuse for Hgb <7  Hypertension Chronic / Paroxysmal Atrial Fibrillation/Flutter on Eliquis Hyperlipidemia Currently in sinus rhythm, CHA2DS2-VASc score: 4 - Continuous cardiac monitoring -SCDs overnight, restart Eliquis once cleared by neurosurgery   ENDO - ICU hypoglycemic\Hyperglycemia  protocol -check FSBS per protocol   GI GI PROPHYLAXIS as indicated  NUTRITIONAL STATUS DIET--> as tolerated Constipation protocol as indicated   ELECTROLYTES -follow labs as needed -replace as needed -pharmacy consultation and following   Best Practice (right click and "Reselect all SmartList Selections" daily)  Diet/type: NPO w/ oral meds DVT prophylaxis: SCD GI prophylaxis: PPI Lines: N/A Foley:  N/A Code Status:  full code   Labs   CBC: Recent Labs  Lab 04/20/22 1903 04/21/22 0418  WBC 12.7* 15.5*  HGB 10.1* 9.1*  HCT 32.0* 27.5*  MCV 95.2 90.5  PLT 229 237     Basic Metabolic Panel: Recent Labs  Lab 04/20/22 1903 04/20/22 2027 04/21/22 0418  NA 141  --  141  K 3.9  --  4.2  CL 113*  --  114*  CO2 20*  --  21*  GLUCOSE 111*  --  167*  BUN 14  --  14  CREATININE 0.93  --  0.95  CALCIUM 8.9  --  8.2*  MG  --  2.0 2.0  PHOS  --  2.9 3.4    GFR: Estimated Creatinine Clearance: 40.6 mL/min (by C-G formula based on SCr of 0.95 mg/dL). Recent Labs  Lab 04/20/22 1903 04/20/22 2027 04/21/22 0418  PROCALCITON  --  <0.10 <0.10  WBC 12.7*  --  15.5*     Liver Function Tests: No results for input(s): "AST", "ALT", "ALKPHOS", "BILITOT", "PROT", "ALBUMIN" in the last 168 hours. No results  for input(s): "LIPASE", "AMYLASE" in the last 168 hours. No results for input(s): "AMMONIA" in the last 168 hours.  ABG No results found for: "PHART", "PCO2ART", "PO2ART", "HCO3", "TCO2", "ACIDBASEDEF", "O2SAT"   Coagulation Profile: Recent Labs  Lab 04/20/22 2027  INR 1.3*    Cardiac Enzymes: No results for input(s): "CKTOTAL", "CKMB", "CKMBINDEX", "TROPONINI" in the last 168 hours.  HbA1C: Hgb A1c MFr Bld  Date/Time Value Ref Range Status  12/12/2016 09:46 AM 5.3 4.6 - 6.5 % Final    Comment:    Glycemic Control Guidelines for People with Diabetes:Non Diabetic:  <6%Goal of Therapy: <7%Additional Action Suggested:  >8%     CBG: Recent Labs  Lab  04/21/22 0025  GLUCAP 116*      Allergies Allergies  Allergen Reactions   Codeine Nausea And Vomiting    Can take cough syrups   Diltiazem Rash          Home Medications  Prior to Admission medications   Medication Sig Start Date End Date Taking? Authorizing Provider  acetaminophen (TYLENOL) 500 MG tablet Take 1,000 mg by mouth every 6 (six) hours as needed for moderate pain.    [provider]  Adalimumab 40 MG/0.8ML PNKT Inject 40 mg into the skin every 14 (fourteen) days. 10/24/16   [provider]  Calcium Carbonate (CALCIUM 600 PO) Take 600 mg by mouth every other day.     [provider]  cetirizine (ZYRTEC) 10 MG tablet Take 10 mg by mouth daily.    [provider]  Coenzyme Q-10 100 MG capsule Take 100 mg by mouth daily.    [provider]  ELIQUIS 5 MG TABS tablet TAKE 1 TABLET TWICE DAILY 06/28/21   Dutch Quint B, FNP  hydroxychloroquine (PLAQUENIL) 200 MG tablet  09/06/20   [provider]  levothyroxine (SYNTHROID) 50 MCG tablet TAKE 1 TABLET EVERY DAY BEFORE BREAKFAST 06/28/21   Dutch Quint B, FNP  losartan (COZAAR) 50 MG tablet TAKE 1 TABLET EVERY DAY (NEED MD APPOINTMENT) 01/09/22   Dutch Quint B, FNP  metoprolol tartrate (LOPRESSOR) 25 MG tablet TAKE 1 TABLET TWICE DAILY (NEED MD APPOINTMENT FOR REFILLS) 01/09/22   Dutch Quint B, FNP  Multiple Vitamin (MULTIVITAMIN) tablet Take 1 tablet by mouth daily.    [provider]  rosuvastatin (CRESTOR) 10 MG tablet TAKE 1 TABLET EVERY DAY (NEED MD APPOINTMENT) 01/09/22   Dutch Quint B, FNP  sertraline (ZOLOFT) 50 MG tablet TAKE 2 TABLETS EVERY DAY (NEED TO CALL AND SCHEDULE A FOLLOW UP FOR FURTHER REFILLS) 01/09/22   Kennyth Arnold, FNP       DVT/GI PRX  assessed I Assessed the need for Labs I Assessed the need for Foley I Assessed the need for Central Venous Line Family Discussion when available I Assessed the need for Mobilization I made an Assessment  of medications to be adjusted accordingly Safety Risk assessment completed  CASE DISCUSSED IN MULTIDISCIPLINARY ROUNDS WITH ICU TEAM     Critical Care Time devoted to patient care services described in this note is 43 minutes.  Critical care was necessary to treat /prevent imminent and life-threatening deterioration.    Corrin Parker, M.D.  [facility]  Medical Director [place] Director [facility] Cardio-Pulmonary Department

## 2022-04-21 NOTE — Progress Notes (Addendum)
    Attending Progress Note  History: Tamara Whitehead is here for subdural hematoma  POD1: Doing much better  Physical Exam: Vitals:   04/21/22 0600 04/21/22 0630  BP: 93/70 105/71  Pulse: 77 63  Resp: (!) 24 17  Temp:    SpO2: 94% 93%    AA Ox3 CNI  Strength:5/5 throughout BLE  Data:  Other tests/results: CT this AM - much improved subdural hematomas  Assessment/Plan:  Tamara Whitehead is much improved after drainage of her subdural hematomas.  There is a small amount of residual subdural fluid, but it is much improved compared to prior to surgery and her midline shift has improved.  She no longer has brain compression.  I removed the drains this morning.  She can start mobilizing and work with physical therapy.  She is now able to raise her head of bed.  I recommended every 2 hour neurochecks for the next 2 neurochecks, then switch to every 4 hours neurochecks and transition to stepdown status.  - mobilize - pain control - DVT prophylaxis ok tomorrow - PTOT - R shoulder management per Dr. Teola Bradley MD, [facility] Department of Neurosurgery

## 2022-04-21 NOTE — Telephone Encounter (Signed)
Appts made. Will order CT once she is discharged.

## 2022-04-21 NOTE — Telephone Encounter (Signed)
-----   Message from Berdine Addison, RN sent at 04/21/2022  9:11 AM EST ----- Regarding: post ops Post op in 2 weeks and 4-6 weeks, repeat CT prior to 4-6 week appt per CKY

## 2022-04-22 DIAGNOSIS — S065XAA Traumatic subdural hemorrhage with loss of consciousness status unknown, initial encounter: Secondary | ICD-10-CM | POA: Diagnosis not present

## 2022-04-22 LAB — GLUCOSE, CAPILLARY
Glucose-Capillary: 73 mg/dL (ref 70–99)
Glucose-Capillary: 126 mg/dL — ABNORMAL HIGH (ref 70–99)
Glucose-Capillary: 121 mg/dL — ABNORMAL HIGH (ref 70–99)
Glucose-Capillary: 110 mg/dL — ABNORMAL HIGH (ref 70–99)

## 2022-04-22 LAB — CBC
HCT: 24.6 % — ABNORMAL LOW (ref 36.0–46.0)
Hemoglobin: 8 g/dL — ABNORMAL LOW (ref 12.0–15.0)
MCH: 29.9 pg (ref 26.0–34.0)
MCHC: 32.5 g/dL (ref 30.0–36.0)
MCV: 91.8 fL (ref 80.0–100.0)
Platelets: 170 10*3/uL (ref 150–400)
RBC: 2.68 MIL/uL — ABNORMAL LOW (ref 3.87–5.11)
RDW: 14.7 % (ref 11.5–15.5)
WBC: 10.5 10*3/uL (ref 4.0–10.5)
nRBC: 0 % (ref 0.0–0.2)

## 2022-04-22 LAB — BASIC METABOLIC PANEL
Anion gap: 5 (ref 5–15)
BUN: 19 mg/dL (ref 8–23)
CO2: 25 mmol/L (ref 22–32)
Calcium: 8.4 mg/dL — ABNORMAL LOW (ref 8.9–10.3)
Chloride: 110 mmol/L (ref 98–111)
Creatinine, Ser: 1.01 mg/dL — ABNORMAL HIGH (ref 0.44–1.00)
GFR, Estimated: 57 mL/min — ABNORMAL LOW (ref >60)
Glucose, Bld: 98 mg/dL (ref 70–99)
Potassium: 3.9 mmol/L (ref 3.5–5.1)
Sodium: 140 mmol/L (ref 135–145)

## 2022-04-22 LAB — PHOSPHORUS: Phosphorus: 3.1 mg/dL (ref 2.5–4.6)

## 2022-04-22 LAB — MAGNESIUM: Magnesium: 1.8 mg/dL (ref 1.7–2.4)

## 2022-04-22 LAB — PROCALCITONIN: Procalcitonin: <0.1 ng/mL

## 2022-04-22 MED ORDER — MAGNESIUM SULFATE 2 GM/50ML IV SOLN
2.0000 g | Freq: Once | INTRAVENOUS | Status: AC
  Administered 2022-04-22: 2 g via INTRAVENOUS
  Filled 2022-04-22: qty 50

## 2022-04-22 MED ORDER — SERTRALINE HCL 50 MG PO TABS
50.0000 mg | ORAL_TABLET | Freq: Two times a day (BID) | ORAL | Status: DC
  Administered 2022-04-22 – 2022-04-23 (×3): 50 mg via ORAL
  Filled 2022-04-22 (×2): qty 1

## 2022-04-22 MED ORDER — PANTOPRAZOLE SODIUM 40 MG PO TBEC
40.0000 mg | DELAYED_RELEASE_TABLET | Freq: Every day | ORAL | Status: DC
  Administered 2022-04-22: 40 mg via ORAL
  Filled 2022-04-22: qty 1

## 2022-04-22 NOTE — Progress Notes (Signed)
PHARMACIST - PHYSICIAN COMMUNICATION  CONCERNING: IV to Oral Route Change Policy  RECOMMENDATION: This patient is receiving pantoprazole by the intravenous route.  Based on criteria approved by the Pharmacy and Therapeutics Committee, the intravenous medication(s) is/are being converted to the equivalent oral dose form(s).   DESCRIPTION: These criteria include: The patient is eating (either orally or via tube) and/or has been taking other orally administered medications for a least 24 hours The patient has no evidence of active gastrointestinal bleeding or impaired GI absorption (gastrectomy, short bowel, patient on TNA or NPO).  If you have questions about this conversion, please contact the [place], [facility] 04/22/2022 7:58 AM

## 2022-04-22 NOTE — Progress Notes (Signed)
PROGRESS NOTE  Tamara Whitehead  BSJ:[id number] DOB: [birth year] DOA: 04/20/2022 PCP: Leone Haven, MD   Brief Narrative:  Patient is a 76 year old female with history of a flutter on Eliquis, hypertension, hypothyroidism, hyperlipidemia who presented from home with complaint of weakness, headache, recent falls.  Report of falling 3 weeks ago on parking garage.  She developed ecchymosis under both eyes, abrasion on the chin, bruising on the right arm with facial swelling.  No loss of consciousness.  She refused to go to the ED or urgent care to be checked at that time.  At home, she was then noted to have balance issues, headache, word finding difficulties and mental status started to decline.  Fell 3 times at home.  Complained of severe headache.  On presentation, patient was alert and oriented but had word finding difficulty.   Imaging showed right humeral head fracture with a displaced greater tuberosity.  CT imaging of the head showed bilateral subdural hematoma left greater than right with a 4 mm rightward midline shift.  Lab work showed leukocytosis.  Orthopedics, neurosurgery consulted.  Underwent evaluation of subdural hematoma on 11/30.  Patient was admitted to ICU.  Currently hemodynamically stable.  Transferred to our service on 12/1.  Assessment & Plan:  Principal Problem:   Subdural hematoma (HCC) Active Problems:   Compression of brain [facility])  Traumatic bilateral subdural hematoma: Neurosurgery consulted.  Status post evacuation and drain placement.  Neurosurgery following and cleared for mobilization. Status post removal of drains on 11/30. PT/OT consulted,outpatient follow up recommended.  Continue neurochecks Q4HR.  Right humeral head fracture: Secondary to fall.  Orthopedics already consulted and recommended conservative management.  She has a right shoulder sling.  She will follow-up with orthopedics as an outpatient  Chronic A-fib/flutter: Currently rate is controlled.  On  Eliquis for anticoagulation. Takes metoprolol at home. We will restart Eliquis once cleared by neurosurgery  Hypertension: On metoprolol at home.  Started on midodrine here due to hypotension.  Since patient is normotensive, will DC midodrine and restart home metoprolol on dc  Normocytic anemia: Unclear etiology.  Hemoglobin currently in the range of 8-9.  Likely chronic  Leukocytosis: Most likely reactive.  Resolved.  Procalcitonin not reassuring.  Hypothyroidism: On levothyroxine  Type 2 diabetes: Continue monitoring blood sugars, currently on sliding scale insulin.  Hemoglobin A1c in the range of 5  Hyperlipidemia: On Crestor  History of depression: On Zoloft  History of psoriasis: On Plaquenil, adalimumab      DVT prophylaxis:SCDs Start: 04/20/22 2135     Code Status: Full Code  Family Communication: Son at bedside  Patient status:Inpatient  Patient is from :home  Anticipated discharge TM:LYYT  Estimated DC date:tomorrow   Consultants: Neurosurgery, PCCM, orthopedics  Procedures: Evacuation of hematoma  Antimicrobials:  Anti-infectives (From admission, onward)    None       Subjective: Patient seen and examined at bedside this morning.  Hemodynamically stable.  Blood pressure stable.  She is alert and oriented.  Denies any headache.  She was working with physical therapist during my evaluation.  Objective: Vitals:   04/22/22 0400 04/22/22 0600 04/22/22 0700 04/22/22 0805  BP: (!) 127/50 (!) 165/67 (!) 154/63 (!) 158/127  Pulse: (!) 55 (!) 58 62 (!) 57  Resp: 16 17 16 18   Temp:      TempSrc:      SpO2: 96% 92% 97% 100%  Weight:      Height:        Intake/Output Summary (  Last 24 hours) at 04/22/2022 0856 Last data filed at 04/22/2022 0735 Gross per 24 hour  Intake 2706.89 ml  Output 2120 ml  Net 586.89 ml   Filed Weights   04/21/22 0026 04/21/22 0200 04/22/22 0200  Weight: 57.7 kg 57.7 kg 56.9 kg    Examination:  General exam: Overall  comfortable, not in distress HEENT: PEARRl Respiratory system:  no wheezes or crackles  Cardiovascular system: S1 & S2 heard, RRR.  Gastrointestinal system: Abdomen is nondistended, soft and nontender. Central nervous system: Alert and oriented Extremities: No edema, no clubbing ,no cyanosis Skin: No rashes, no ulcers,no icterus     Data Reviewed: I have personally reviewed following labs and imaging studies  CBC: Recent Labs  Lab 04/20/22 1903 04/21/22 0418 04/22/22 0445  WBC 12.7* 15.5* 10.5  HGB 10.1* 9.1* 8.0*  HCT 32.0* 27.5* 24.6*  MCV 95.2 90.5 91.8  PLT 229 237 299   Basic Metabolic Panel: Recent Labs  Lab 04/20/22 1903 04/20/22 2027 04/21/22 0418 04/22/22 0445  NA 141  --  141 140  K 3.9  --  4.2 3.9  CL 113*  --  114* 110  CO2 20*  --  21* 25  GLUCOSE 111*  --  167* 98  BUN 14  --  14 19  CREATININE 0.93  --  0.95 1.01*  CALCIUM 8.9  --  8.2* 8.4*  MG  --  2.0 2.0 1.8  PHOS  --  2.9 3.4 3.1     Recent Results (from the past 240 hour(s))  MRSA Next Gen by PCR, Nasal     Status: None   Collection Time: 04/21/22 12:38 AM   Specimen: Nasal Mucosa; Nasal Swab  Result Value Ref Range Status   MRSA by PCR Next Gen NOT DETECTED NOT DETECTED Final    Comment: (NOTE) The GeneXpert MRSA Assay (FDA approved for NASAL specimens only), is one component of a comprehensive MRSA colonization surveillance program. It is not intended to diagnose MRSA infection nor to guide or monitor treatment for MRSA infections. Test performance is not FDA approved in patients less than 77 years old. Performed at [facility], [address] [place], [place] [address]      Radiology Studies: CT HEAD WO CONTRAST (5MM)  Result Date: 04/21/2022 CLINICAL DATA:  Subdural hematoma follow-up EXAM: CT HEAD WITHOUT CONTRAST TECHNIQUE: Contiguous axial images were obtained from the base of the skull through the vertex without intravenous contrast. RADIATION DOSE REDUCTION:  This exam was performed according to the departmental dose-optimization program which includes automated exposure control, adjustment of the mA and/or kV according to patient size and/or use of iterative reconstruction technique. COMPARISON:  04/20/2022 FINDINGS: Brain: First scan after burr hole drainage of bilateral subdural hematoma. The dominant remaining pocket is along the high right frontal parietal convexity at 11 mm thickness. Small volume acute blood along the left posterior falx. Bilateral drains, terminating in the scalp on the right and in the subdural space on the left. No complicating infarct or hydrocephalus. Expected pneumocephalus. Vascular: No hyperdense vessel or unexpected calcification. Skull: Unremarkable burr holes. Sinuses/Orbits: No acute finding. IMPRESSION: Evacuated subdural hematomas with resolved cerebral mass effect. The dominant remaining pocket is at the right frontal parietal convexity measuring up to 11 mm in thickness. Small area of acute blood along the posterior left falx. Electronically Signed   By: Jorje Guild M.D.   On: 04/21/2022 05:50   DG Chest Portable 1 View  Result Date: 04/20/2022 CLINICAL  DATA:  Short of breath, weakness, fell 3-4 weeks ago EXAM: PORTABLE CHEST 1 VIEW COMPARISON:  10/16/2019 FINDINGS: Single frontal view of the chest demonstrates an unremarkable cardiac silhouette. Chronic right upper lobe scarring. No airspace disease, effusion, or pneumothorax. No acute displaced fracture. IMPRESSION: 1. No acute intrathoracic process. Electronically Signed   By: Randa Ngo M.D.   On: 04/20/2022 21:00   CT Head Wo Contrast  Result Date: 04/20/2022 CLINICAL DATA:  New onset headache EXAM: CT HEAD WITHOUT CONTRAST CT CERVICAL SPINE WITHOUT CONTRAST TECHNIQUE: Multidetector CT imaging of the head and cervical spine was performed following the standard protocol without intravenous contrast. Multiplanar CT image reconstructions of the cervical spine  were also generated. RADIATION DOSE REDUCTION: This exam was performed according to the departmental dose-optimization program which includes automated exposure control, adjustment of the mA and/or kV according to patient size and/or use of iterative reconstruction technique. COMPARISON:  None Available. FINDINGS: CT HEAD FINDINGS Brain: There is an isodense left convexity subdural hematoma 20 mm in thickness. Likely small acute component anteriorly (axial image 13). There is a hypodense chronic right subdural hematoma, 7 mm. There is rightward midline shift of 4 mm. Vascular: No abnormal hyperdensity of the major intracranial arteries or dural venous sinuses. No intracranial atherosclerosis. Skull: The visualized skull base, calvarium and extracranial soft tissues are normal. Sinuses/Orbits: No fluid levels or advanced mucosal thickening of the visualized paranasal sinuses. No mastoid or middle ear effusion. The orbits are normal. CT CERVICAL SPINE FINDINGS Alignment: Reversal of normal cervical lordosis Skull base and vertebrae: No acute fracture. Soft tissues and spinal canal: No prevertebral fluid or swelling. No visible canal hematoma. Disc levels: Multilevel facet arthrosis Upper chest: No pneumothorax, pulmonary nodule or pleural effusion. Other: Normal visualized paraspinal cervical soft tissues. IMPRESSION: 1. Isodense left convexity subdural hematoma measuring 20 mm in thickness with rightward midline shift of 4 mm. 2. Hypodense chronic right subdural hematoma, 7 mm. 3. No acute fracture or static subluxation of the cervical spine. Critical Value/emergent results were called by telephone at the time of interpretation on 04/20/2022 at 7:44 pm to provider [facility] , who verbally acknowledged these results. Electronically Signed   By: Ulyses Jarred M.D.   On: 04/20/2022 19:44   CT CERVICAL SPINE WO CONTRAST  Result Date: 04/20/2022 CLINICAL DATA:  New onset headache EXAM: CT HEAD WITHOUT CONTRAST  CT CERVICAL SPINE WITHOUT CONTRAST TECHNIQUE: Multidetector CT imaging of the head and cervical spine was performed following the standard protocol without intravenous contrast. Multiplanar CT image reconstructions of the cervical spine were also generated. RADIATION DOSE REDUCTION: This exam was performed according to the departmental dose-optimization program which includes automated exposure control, adjustment of the mA and/or kV according to patient size and/or use of iterative reconstruction technique. COMPARISON:  None Available. FINDINGS: CT HEAD FINDINGS Brain: There is an isodense left convexity subdural hematoma 20 mm in thickness. Likely small acute component anteriorly (axial image 13). There is a hypodense chronic right subdural hematoma, 7 mm. There is rightward midline shift of 4 mm. Vascular: No abnormal hyperdensity of the major intracranial arteries or dural venous sinuses. No intracranial atherosclerosis. Skull: The visualized skull base, calvarium and extracranial soft tissues are normal. Sinuses/Orbits: No fluid levels or advanced mucosal thickening of the visualized paranasal sinuses. No mastoid or middle ear effusion. The orbits are normal. CT CERVICAL SPINE FINDINGS Alignment: Reversal of normal cervical lordosis Skull base and vertebrae: No acute fracture. Soft tissues and spinal canal: No prevertebral fluid  or swelling. No visible canal hematoma. Disc levels: Multilevel facet arthrosis Upper chest: No pneumothorax, pulmonary nodule or pleural effusion. Other: Normal visualized paraspinal cervical soft tissues. IMPRESSION: 1. Isodense left convexity subdural hematoma measuring 20 mm in thickness with rightward midline shift of 4 mm. 2. Hypodense chronic right subdural hematoma, 7 mm. 3. No acute fracture or static subluxation of the cervical spine. Critical Value/emergent results were called by telephone at the time of interpretation on 04/20/2022 at 7:44 pm to provider [facility] , who  verbally acknowledged these results. Electronically Signed   By: Ulyses Jarred M.D.   On: 04/20/2022 19:44   DG Shoulder Right  Result Date: 04/20/2022 CLINICAL DATA:  Fall EXAM: RIGHT SHOULDER - 2+ VIEW COMPARISON:  None Available. FINDINGS: There is a fracture of the humeral head with displacement of the greater tuberosity superiorly and medially. The fracture may also involve the anatomic neck. CT might be helpful for further definition of this finding. IMPRESSION: Humeral head fracture with displaced greater tuberosity. Cannot exclude a fracture involving the anatomic neck, and CT might be helpful. Electronically Signed   By: Sammie Bench M.D.   On: 04/20/2022 19:32    Scheduled Meds:  Chlorhexidine Gluconate Cloth  6 each Topical Daily   insulin aspart  0-5 Units Subcutaneous QHS   insulin aspart  0-9 Units Subcutaneous TID WC   levothyroxine  50 mcg Oral Q0600   midodrine  10 mg Oral TID WC   pantoprazole  40 mg Oral QHS   sertraline  100 mg Oral Daily   Continuous Infusions:  sodium chloride Stopped (04/21/22 0155)   magnesium sulfate bolus IVPB       LOS: 2 days   Shelly Coss, MD [facility] P12/05/2021, 8:56 AM

## 2022-04-22 NOTE — Progress Notes (Signed)
    Attending Progress Note  History: Tamara Whitehead is here for subdural hematoma  POD2: NAEO. Pt states she stood this morning. Some describes some stinging incision pain and right shoulder pain but is otherwise doing well.  POD1: Doing much better  Physical Exam: Vitals:   04/22/22 0805 04/22/22 0900  BP: (!) 158/127 (!) 117/43  Pulse: (!) 57 (!) 59  Resp: 18 16  Temp: (!) 101 F (38.3 C)   SpO2: 100% 96%    AA Ox3 CNI  Strength:5/5 throughout BLE. No pronator drift on the left. Right untested due to shoulder injury.  Data:  Other tests/results: CT 04/21/22 - much improved subdural hematomas  Assessment/Plan:  Tamara Whitehead is much improved after drainage of her subdural hematomas.  There is a small amount of residual subdural fluid, but it is much improved compared to prior to surgery and her midline shift has improved.  She no longer has brain compression.  - drain removed 11/30 - removed dressing this morning. Ok to  leave open to air. - q4 hour neurochecks - mobilize - pain control - DVT prophylaxis ok 04/22/22 - PTOT; ok from neurosurgical standpoint to mobilize and raise [facility] - R shoulder management per Dr. Marinus Maw PA-C Department of Neurosurgery

## 2022-04-22 NOTE — Evaluation (Signed)
Physical Therapy Evaluation Patient Details Name: Tamara Whitehead MRN: [id number] DOB: 07/26/1945 Today's Date: 04/22/2022  History of Present Illness  76 y/o female presented to ED on 04/20/22 for LE weakness and headache. Fall x 3 weeks ago and reports R shoulder pain. Imaging showed R humeral head fracture with displaced greater tuberosity. CT showed bilateral SDH (L>R) with 56mm rightward midline shift. S/p bilateral frontal burr hole for drainage on 11/29. PMH: Aflutter on Eliquis, HTN, RA, glaucoma  Clinical Impression  Patient admitted with the above and s/p above procedure. PTA, patient lives independently and recently moved into apartment with level entry and will have son living with her for a short time period at discharge. Patient presents with weakness, impaired balance, and decreased activity tolerance. Education provided on R shoulder sling and limitations by OT. Required minA for bed mobility and min guard for sit to stand with HHAx1. Ambulated 150' with HHAx1 and min guard with no LOB noted. Encouraged use of cane for mobility for safety and support due to patient reaching for objects during ambulation. Patient will benefit from skilled PT services during acute stay to address listed deficits. Recommend OPPT at discharge to address balance and strength deficits.        Recommendations for follow up therapy are one component of a multi-disciplinary discharge planning process, led by the attending physician.  Recommendations may be updated based on patient status, additional functional criteria and insurance authorization.  Follow Up Recommendations Outpatient PT      Assistance Recommended at Discharge Intermittent Supervision/Assistance  Patient can return home with the following  A little help with walking and/or transfers;A little help with bathing/dressing/bathroom;Assistance with cooking/housework;Assist for transportation;Direct supervision/assist for medications  management;Direct supervision/assist for financial management    Equipment Recommendations BSC/3in1  Recommendations for Other Services       Functional Status Assessment Patient has had a recent decline in their functional status and demonstrates the ability to make significant improvements in function in a reasonable and predictable amount of time.     Precautions / Restrictions Precautions Precautions: Fall Required Braces or Orthoses: Sling Restrictions Weight Bearing Restrictions: Yes RUE Weight Bearing: Non weight bearing Other Position/Activity Restrictions: okay for elbow and hand AROM      Mobility  Bed Mobility Overal bed mobility: Needs Assistance Bed Mobility: Supine to Sit, Sit to Supine     Supine to sit: Min assist Sit to supine: Min assist   General bed mobility comments: assist for trunk elevation to EOB and bringing LEs back into bed    Transfers Overall transfer level: Needs assistance Equipment used: 1 person hand held assist Transfers: Sit to/from Stand Sit to Stand: Min guard           General transfer comment: min guard for safety. Patient reaching for HHA for support    Ambulation/Gait Ambulation/Gait assistance: Min guard Gait Distance (Feet): 150 Feet Assistive device: 1 person hand held assist Gait Pattern/deviations: Step-through pattern, Decreased stride length Gait velocity: decreased     General Gait Details: min guard for safety. No physical assistance required. Reaching for UE support and provided HHAx1  Stairs            Wheelchair Mobility    Modified Rankin (Stroke Patients Only)       Balance Overall balance assessment: Mild deficits observed, not formally tested, History of Falls  Pertinent Vitals/Pain Pain Assessment Pain Assessment: Faces Faces Pain Scale: Hurts little more Pain Location: head - incision site, R UE Pain Descriptors /  Indicators: Discomfort, Grimacing Pain Intervention(s): Monitored during session, Repositioned    Home Living Family/patient expects to be discharged to:: Private residence Living Arrangements: Alone (son plans to stay with patient "for awhile") Available Help at Discharge: Family;Available 24 hours/day Type of Home: Apartment Home Access: Level entry       Home Layout: One level Home Equipment: Cane - single point      Prior Function Prior Level of Function : Independent/Modified Independent;Driving             Mobility Comments: reports x 1 fall which is reason she came in. uses cane occasionally with RA flare up       Hand Dominance        Extremity/Trunk Assessment   Upper Extremity Assessment Upper Extremity Assessment: Defer to OT evaluation    Lower Extremity Assessment Lower Extremity Assessment: Generalized weakness    Cervical / Trunk Assessment Cervical / Trunk Assessment: Normal  Communication   Communication: No difficulties  Cognition Arousal/Alertness: Awake/alert Behavior During Therapy: WFL for tasks assessed/performed Overall Cognitive Status: Within Functional Limits for tasks assessed                                          General Comments      Exercises     Assessment/Plan    PT Assessment Patient needs continued PT services  PT Problem List Decreased strength;Decreased activity tolerance;Decreased balance;Decreased mobility       PT Treatment Interventions DME instruction;Gait training;Functional mobility training;Therapeutic activities;Therapeutic exercise;Balance training;Patient/family education    PT Goals (Current goals can be found in the Care Plan section)  Acute Rehab PT Goals Patient Stated Goal: to go home PT Goal Formulation: With patient Time For Goal Achievement: 05/06/22 Potential to Achieve Goals: Good    Frequency Min 2X/week     Co-evaluation               AM-PAC PT "6  Clicks" Mobility  Outcome Measure Help needed turning from your back to your side while in a flat bed without using bedrails?: A Little Help needed moving from lying on your back to sitting on the side of a flat bed without using bedrails?: A Little Help needed moving to and from a bed to a chair (including a wheelchair)?: A Little Help needed standing up from a chair using your arms (e.g., wheelchair or bedside chair)?: A Little Help needed to walk in hospital room?: A Little Help needed climbing 3-5 steps with a railing? : A Little 6 Click Score: 18    End of Session Equipment Utilized During Treatment: Other (comment) (R shoulder sling) Activity Tolerance: Patient tolerated treatment well Patient left: in bed;with call bell/phone within reach;with family/visitor present Nurse Communication: Mobility status PT Visit Diagnosis: Unsteadiness on feet (R26.81);Muscle weakness (generalized) (M62.81);History of falling (Z91.81)    Time: 1010-1039 PT Time Calculation (min) (ACUTE ONLY): 29 min   Charges:   PT Evaluation $PT Eval Moderate Complexity: 1 Mod          Sanjuanita Condrey A. Gilford Rile PT, DPT [place] [facility] Richa Shor 04/22/2022, 12:32 PM

## 2022-04-22 NOTE — Evaluation (Signed)
Occupational Therapy Evaluation Patient Details Name: Tamara Whitehead MRN: [id number] DOB: 04-19-1946 Today's Date: 04/22/2022   History of Present Illness 76 y/o female presented to ED on 04/20/22 for LE weakness and headache. Fall x 3 weeks ago and reports R shoulder pain. Imaging showed R humeral head fracture with displaced greater tuberosity. CT showed bilateral SDH (L>R) with 86mm rightward midline shift. S/p bilateral frontal burr hole for drainage on 11/29. PMH: Aflutter on Eliquis, HTN, RA, glaucoma   Clinical Impression   Patient presenting with decreased Ind in self care, balance, functional mobility/transfers, endurance, and safety awareness. Patient reports recently moving to an apartment and son will be staying with her at discharge. Pt is independent at baseline with use of cane. OT repositioned end educated pt about sling use and NWB status. OT educated caregiver on how to assist pt with UB self care tasks while maintaining precautions. Patient currently functioning at min guard - min A for functional mobility and toileting needs. Pt able Patient will benefit from acute OT to increase overall independence in the areas of ADLs, functional mobility, and safety awareness in order to safely discharge home with son.     Recommendations for follow up therapy are one component of a multi-disciplinary discharge planning process, led by the attending physician.  Recommendations may be updated based on patient status, additional functional criteria and insurance authorization.   Follow Up Recommendations  Outpatient OT     Assistance Recommended at Discharge Intermittent Supervision/Assistance  Patient can return home with the following A little help with walking and/or transfers;A little help with bathing/dressing/bathroom;Help with stairs or ramp for entrance;Assist for transportation;Direct supervision/assist for financial management    Functional Status Assessment  Patient has had a  recent decline in their functional status and demonstrates the ability to make significant improvements in function in a reasonable and predictable amount of time.  Equipment Recommendations  BSC/3in1       Precautions / Restrictions Precautions Precautions: Fall Required Braces or Orthoses: Sling Restrictions Weight Bearing Restrictions: No RUE Weight Bearing: Non weight bearing Other Position/Activity Restrictions: okay for elbow and hand AROM      Mobility Bed Mobility Overal bed mobility: Needs Assistance Bed Mobility: Supine to Sit, Sit to Supine     Supine to sit: Min assist Sit to supine: Min assist   General bed mobility comments: assist for trunk elevation to EOB and bringing LEs back into bed    Transfers Overall transfer level: Needs assistance Equipment used: 1 person hand held assist Transfers: Sit to/from Stand Sit to Stand: Min guard           General transfer comment: min guard for safety. Patient reaching for HHA for support      Balance Overall balance assessment: Mild deficits observed, not formally tested, History of Falls                                         ADL either performed or assessed with clinical judgement   ADL Overall ADL's : Needs assistance/impaired                         Toilet Transfer: Minimal assistance;Regular Toilet;Ambulation   Toileting- Clothing Manipulation and Hygiene: Minimal assistance;Sit to/from stand       Functional mobility during ADLs: Min guard;Minimal assistance       Vision Patient  Visual Report: No change from baseline              Pertinent Vitals/Pain Pain Assessment Pain Assessment: Faces Faces Pain Scale: Hurts little more Pain Location: head - incision site, R UE Pain Descriptors / Indicators: Discomfort, Grimacing Pain Intervention(s): Monitored during session, Repositioned        Extremity/Trunk Assessment Upper Extremity Assessment Upper Extremity  Assessment: RUE deficits/detail RUE Deficits / Details: fx and in sling RUE: Unable to fully assess due to immobilization   Lower Extremity Assessment Lower Extremity Assessment: Generalized weakness   Cervical / Trunk Assessment Cervical / Trunk Assessment: Normal   Communication Communication Communication: No difficulties   Cognition Arousal/Alertness: Awake/alert Behavior During Therapy: WFL for tasks assessed/performed Overall Cognitive Status: Within Functional Limits for tasks assessed                                                  Home Living Family/patient expects to be discharged to:: Private residence Living Arrangements: Alone (son plans to stay with her at discharge) Available Help at Discharge: Family;Available 24 hours/day Type of Home: Apartment Home Access: Level entry     Home Layout: One level     Bathroom Shower/Tub: Teacher, early years/pre: Standard     Home Equipment: Cane - single point          Prior Functioning/Environment Prior Level of Function : Independent/Modified Independent;Driving             Mobility Comments: reports x 1 fall which is reason she came in. uses cane occasionally with RA flare up ADLs Comments: Pt reports being Ind in self care and IADLs        OT Problem List: Decreased strength;Decreased activity tolerance;Impaired balance (sitting and/or standing);Decreased safety awareness;Decreased knowledge of use of DME or AE      OT Treatment/Interventions: Self-care/ADL training;Therapeutic exercise;Therapeutic activities;DME and/or AE instruction;Manual therapy;Balance training;Patient/family education    OT Goals(Current goals can be found in the care plan section) Acute Rehab OT Goals Patient Stated Goal: to return home OT Goal Formulation: With patient/family Time For Goal Achievement: 05/06/22 Potential to Achieve Goals: Good ADL Goals Pt Will Perform Grooming: with  supervision;standing Pt Will Perform Lower Body Dressing: with min assist;sit to/from stand Pt Will Transfer to Toilet: with min assist;ambulating Pt Will Perform Toileting - Clothing Manipulation and hygiene: with min assist;sit to/from stand  OT Frequency: Min 2X/week       AM-PAC OT "6 Clicks" Daily Activity     Outcome Measure Help from another person eating meals?: None Help from another person taking care of personal grooming?: A Little Help from another person toileting, which includes using toliet, bedpan, or urinal?: A Little Help from another person bathing (including washing, rinsing, drying)?: A Little Help from another person to put on and taking off regular upper body clothing?: A Little Help from another person to put on and taking off regular lower body clothing?: A Little 6 Click Score: 19   End of Session Nurse Communication: Mobility status  Activity Tolerance: Patient tolerated treatment well Patient left: in bed;with call bell/phone within reach;with bed alarm set  OT Visit Diagnosis: Unsteadiness on feet (R26.81);Repeated falls (R29.6);Muscle weakness (generalized) (M62.81)                Time: 1010-1035 OT Time Calculation (min): 25  min Charges:  OT General Charges $OT Visit: 1 Visit OT Evaluation $OT Eval Moderate Complexity: [address] [place], OTR/L , [facility] ascom [phone]  04/22/22, 1:45 PM

## 2022-04-22 NOTE — Consult Note (Signed)
PHARMACY CONSULT NOTE  Pharmacy Consult for Electrolyte Monitoring and Replacement   Recent Labs: Potassium (mmol/L)  Date Value  04/22/2022 3.9   Magnesium (mg/dL)  Date Value  04/22/2022 1.8   Calcium (mg/dL)  Date Value  04/22/2022 8.4 (L)   Albumin (g/dL)  Date Value  08/24/2020 4.5  02/02/2015 4.4   Phosphorus (mg/dL)  Date Value  04/22/2022 3.1   Sodium (mmol/L)  Date Value  04/22/2022 140  02/02/2015 142   Assessment: 76 y/o F with medical history including Aflutter on Eliquis, fibrocystic breast disease, HTN, hypothyroidism, RA, HLD, glaucoma, cataracts, skin cancer who is admitted with traumatic bilateral SDH s/p drainage. Pharmacy consulted to assist with electrolyte monitoring and replacement as indicated.  Goal of Therapy:  Electrolytes within normal limits  Plan:  --Mg 1.8, will give magnesium sulfate 2 g IV x 1 --Patient care transferred from PCCM to [facility]. Will discontinue electrolyte consult at this time. Defer further ordering of labs and electrolyte replacement to primary team  Benita Gutter 04/22/2022 7:54 AM

## 2022-04-23 DIAGNOSIS — S065XAA Traumatic subdural hemorrhage with loss of consciousness status unknown, initial encounter: Secondary | ICD-10-CM | POA: Diagnosis not present

## 2022-04-23 LAB — CBC
HCT: 27.9 % — ABNORMAL LOW (ref 36.0–46.0)
Hemoglobin: 8.7 g/dL — ABNORMAL LOW (ref 12.0–15.0)
MCH: 29.7 pg (ref 26.0–34.0)
MCHC: 31.2 g/dL (ref 30.0–36.0)
MCV: 95.2 fL (ref 80.0–100.0)
Platelets: 179 10*3/uL (ref 150–400)
RBC: 2.93 MIL/uL — ABNORMAL LOW (ref 3.87–5.11)
RDW: 14.5 % (ref 11.5–15.5)
WBC: 9.7 10*3/uL (ref 4.0–10.5)
nRBC: 0 % (ref 0.0–0.2)

## 2022-04-23 LAB — IRON AND TIBC
Iron: 20 ug/dL — ABNORMAL LOW (ref 28–170)
Saturation Ratios: 5 % — ABNORMAL LOW (ref 10.4–31.8)
TIBC: 447 ug/dL (ref 250–450)
UIBC: 427 ug/dL

## 2022-04-23 LAB — GLUCOSE, CAPILLARY
Glucose-Capillary: 87 mg/dL (ref 70–99)
Glucose-Capillary: 114 mg/dL — ABNORMAL HIGH (ref 70–99)

## 2022-04-23 MED ORDER — FERROUS SULFATE 325 (65 FE) MG PO TABS: 325.0000 mg | ORAL_TABLET | Freq: Every day | ORAL | 1 refills | Status: AC

## 2022-04-23 MED ORDER — METOPROLOL TARTRATE 25 MG PO TABS: 12.5000 mg | ORAL_TABLET | Freq: Two times a day (BID) | ORAL | 0 refills | Status: AC

## 2022-04-23 MED ORDER — PANTOPRAZOLE SODIUM 40 MG PO TBEC: 40.0000 mg | DELAYED_RELEASE_TABLET | Freq: Every day | ORAL | 0 refills | Status: AC

## 2022-04-23 MED ORDER — OXYCODONE-ACETAMINOPHEN 5-325 MG PO TABS: 1.0000 | ORAL_TABLET | Freq: Four times a day (QID) | ORAL | 0 refills | Status: AC | PRN

## 2022-04-23 MED ORDER — SODIUM CHLORIDE 0.9 % IV SOLN
510.0000 mg | Freq: Once | INTRAVENOUS | Status: AC
  Administered 2022-04-23: 510 mg via INTRAVENOUS
  Filled 2022-04-23: qty 17

## 2022-04-23 MED ORDER — ADALIMUMAB 40 MG/0.8ML ~~LOC~~ AJKT: 40.0000 mg | AUTO-INJECTOR | SUBCUTANEOUS | Status: AC

## 2022-04-23 NOTE — Discharge Summary (Signed)
Physician Discharge Summary  Tamara Whitehead SWH:[id number] DOB: [birth year] DOA: 04/20/2022  PCP: Leone Haven, MD  Admit date: 04/20/2022 Discharge date: 04/23/2022  Admitted From: Home Disposition:  Home  Discharge Condition:Stable CODE STATUS:FULL Diet recommendation: Heart Healthy   Brief/Interim Summary: Patient is a 76 year old female with history of a flutter on Eliquis, hypertension, hypothyroidism, hyperlipidemia who presented from home with complaint of weakness, headache, recent falls.  Report of falling 3 weeks ago on parking garage.  She developed ecchymosis under both eyes, abrasion on the chin, bruising on the right arm with facial swelling.  No loss of consciousness.  She refused to go to the ED or urgent care to be checked at that time.  At home, she was then noted to have balance issues, headache, word finding difficulties and mental status started to decline.  Fell 3 times at home.  Complained of severe headache.  On presentation, patient was alert and oriented but had word finding difficulty.   Imaging showed right humeral head fracture with a displaced greater tuberosity.  CT imaging of the head showed bilateral subdural hematoma left greater than right with a 4 mm rightward midline shift.  Lab work showed leukocytosis.  Orthopedics, neurosurgery consulted.  Underwent evaluation of subdural hematoma on 11/30.  Patient was admitted to ICU.  Currently hemodynamically stable.  Transferred to our service on 12/1.  Neurosurgery cleared for discharge on 12/2.  She will continue to hold Eliquis until she follows with neurosurgery.  Medically stable for discharge home today.  Following problems were addressed during her hospitalization:  Traumatic bilateral subdural hematoma: Neurosurgery consulted.  Status post evacuation and drain placement. Status post removal of drains on 11/30. PT/OT consulted,outpatient follow up recommended. She will follow-up with neurosurgery as an  outpatient.  Appointment has been scheduled.    Right humeral head fracture: Secondary to fall.  Orthopedics already consulted and recommended conservative management.  She has a right shoulder sling.  She will follow-up with orthopedics as an outpatient   Chronic A-fib/flutter: Currently rate is controlled.  On Eliquis for anticoagulation.  Eliquis will be held until she follows with neurosurgery.  Takes metoprolol at home.  Since her blood pressure is fluctuating, will reduce the dose of metoprolol to 12.5 mg twice daily  Hypertension: On metoprolol, losartan at home.  Started on midodrine here due to hypotension.  Patient is currently normotensive but her blood pressure fluctuates.  Losartan discontinued.  Continue low-dose metoprolol   Normocytic anemia: Unclear etiology.  Denies any hematochezia or melena.  Hemoglobin currently in the range of 8-9.  Her baseline hemoglobin is normal.  Eliquis has been held for now.  Iron checked, found to be low.  Given a dose of IV iron.  Continue oral iron supplementation.  Do a CBC test in a week to check hemoglobin by following with the PCP   leukocytosis: Most likely reactive.  Resolved.  Procalcitonin not reassuring.   Hypothyroidism: On levothyroxine   Hyperlipidemia: On Crestor   History of depression: On Zoloft   History of RA: On Plaquenil, adalimumab    Discharge Diagnoses:  Principal Problem:   Subdural hematoma ([place]) Active Problems:   Compression of brain [facility])    Discharge Instructions  Discharge Instructions     Diet - low sodium heart healthy   Complete by: As directed    Discharge instructions   Complete by: As directed    1)Please take prescribed medications as instructed 2)Hold taking Eliquis until you see neurosurgery 3)You will  be called by neurosurgery for follow-up appointment 4)Monitor your blood pressure at home 5)Follow up with your PCP in a week.  Do a CBC test to check your hemoglobin during the follow-up.    Increase activity slowly   Complete by: As directed    No wound care   Complete by: As directed       Allergies as of 04/23/2022       Reactions   Codeine Nausea And Vomiting   Can take cough syrups   Diltiazem Rash           Medication List     STOP taking these medications    Eliquis 5 MG Tabs tablet Generic drug: apixaban   losartan 50 MG tablet Commonly known as: COZAAR       TAKE these medications    acetaminophen 500 MG tablet Commonly known as: TYLENOL Take 1,000 mg by mouth every 6 (six) hours as needed for moderate pain.   Adalimumab 40 MG/0.8ML Pnkt Inject 40 mg into the skin every 14 (fourteen) days. Hold until you are seen by neurosurgery What changed: additional instructions   CALCIUM 600 PO Take 600 mg by mouth every other day.   cetirizine 10 MG tablet Commonly known as: ZYRTEC Take 10 mg by mouth daily.   Coenzyme Q-10 100 MG capsule Take 100 mg by mouth daily.   ferrous sulfate 325 (65 FE) MG tablet Take 1 tablet (325 mg total) by mouth daily.   hydroxychloroquine 200 MG tablet Commonly known as: PLAQUENIL   levothyroxine 50 MCG tablet Commonly known as: SYNTHROID TAKE 1 TABLET EVERY DAY BEFORE BREAKFAST   metoprolol tartrate 25 MG tablet Commonly known as: LOPRESSOR Take 0.5 tablets (12.5 mg total) by mouth 2 (two) times daily. TAKE 1 TABLET TWICE DAILY (NEED MD APPOINTMENT FOR REFILLS) What changed:  how much to take how to take this when to take this   multivitamin tablet Take 1 tablet by mouth daily.   oxyCODONE-acetaminophen 5-325 MG tablet Commonly known as: PERCOCET/ROXICET Take 1 tablet by mouth every 6 (six) hours as needed for moderate pain.   pantoprazole 40 MG tablet Commonly known as: PROTONIX Take 1 tablet (40 mg total) by mouth at bedtime.   rosuvastatin 10 MG tablet Commonly known as: CRESTOR TAKE 1 TABLET EVERY DAY (NEED MD APPOINTMENT)   sertraline 50 MG tablet Commonly known as: ZOLOFT TAKE 2  TABLETS EVERY DAY (NEED TO CALL AND SCHEDULE A FOLLOW UP FOR FURTHER REFILLS)        Follow-up Information     Leone Haven, MD. Schedule an appointment as soon as possible for a visit in 1 week(s).   Specialty: Family Medicine Contact information: [address] [place] [address] [phone]         Meade Maw, MD. Schedule an appointment as soon as possible for a visit in 1 week(s).   Specialty: Neurosurgery Contact information: [address] [place] [address] [phone]         Skeet Latch, PA-C Follow up.   Specialty: Orthopedic Surgery               Allergies  Allergen Reactions   Codeine Nausea And Vomiting    Can take cough syrups   Diltiazem Rash         Consultations: Neurosurgery, PCCM   Procedures/Studies: CT HEAD WO CONTRAST (5MM)  Result Date: 04/21/2022 CLINICAL DATA:  Subdural hematoma follow-up EXAM: CT HEAD WITHOUT CONTRAST TECHNIQUE:  Contiguous axial images were obtained from the base of the skull through the vertex without intravenous contrast. RADIATION DOSE REDUCTION: This exam was performed according to the departmental dose-optimization program which includes automated exposure control, adjustment of the mA and/or kV according to patient size and/or use of iterative reconstruction technique. COMPARISON:  04/20/2022 FINDINGS: Brain: First scan after burr hole drainage of bilateral subdural hematoma. The dominant remaining pocket is along the high right frontal parietal convexity at 11 mm thickness. Small volume acute blood along the left posterior falx. Bilateral drains, terminating in the scalp on the right and in the subdural space on the left. No complicating infarct or hydrocephalus. Expected pneumocephalus. Vascular: No hyperdense vessel or unexpected calcification. Skull: Unremarkable burr holes. Sinuses/Orbits: No acute finding. IMPRESSION: Evacuated subdural  hematomas with resolved cerebral mass effect. The dominant remaining pocket is at the right frontal parietal convexity measuring up to 11 mm in thickness. Small area of acute blood along the posterior left falx. Electronically Signed   By: Jorje Guild M.D.   On: 04/21/2022 05:50   DG Chest Portable 1 View  Result Date: 04/20/2022 CLINICAL DATA:  Short of breath, weakness, fell 3-4 weeks ago EXAM: PORTABLE CHEST 1 VIEW COMPARISON:  10/16/2019 FINDINGS: Single frontal view of the chest demonstrates an unremarkable cardiac silhouette. Chronic right upper lobe scarring. No airspace disease, effusion, or pneumothorax. No acute displaced fracture. IMPRESSION: 1. No acute intrathoracic process. Electronically Signed   By: Randa Ngo M.D.   On: 04/20/2022 21:00   CT Head Wo Contrast  Result Date: 04/20/2022 CLINICAL DATA:  New onset headache EXAM: CT HEAD WITHOUT CONTRAST CT CERVICAL SPINE WITHOUT CONTRAST TECHNIQUE: Multidetector CT imaging of the head and cervical spine was performed following the standard protocol without intravenous contrast. Multiplanar CT image reconstructions of the cervical spine were also generated. RADIATION DOSE REDUCTION: This exam was performed according to the departmental dose-optimization program which includes automated exposure control, adjustment of the mA and/or kV according to patient size and/or use of iterative reconstruction technique. COMPARISON:  None Available. FINDINGS: CT HEAD FINDINGS Brain: There is an isodense left convexity subdural hematoma 20 mm in thickness. Likely small acute component anteriorly (axial image 13). There is a hypodense chronic right subdural hematoma, 7 mm. There is rightward midline shift of 4 mm. Vascular: No abnormal hyperdensity of the major intracranial arteries or dural venous sinuses. No intracranial atherosclerosis. Skull: The visualized skull base, calvarium and extracranial soft tissues are normal. Sinuses/Orbits: No fluid  levels or advanced mucosal thickening of the visualized paranasal sinuses. No mastoid or middle ear effusion. The orbits are normal. CT CERVICAL SPINE FINDINGS Alignment: Reversal of normal cervical lordosis Skull base and vertebrae: No acute fracture. Soft tissues and spinal canal: No prevertebral fluid or swelling. No visible canal hematoma. Disc levels: Multilevel facet arthrosis Upper chest: No pneumothorax, pulmonary nodule or pleural effusion. Other: Normal visualized paraspinal cervical soft tissues. IMPRESSION: 1. Isodense left convexity subdural hematoma measuring 20 mm in thickness with rightward midline shift of 4 mm. 2. Hypodense chronic right subdural hematoma, 7 mm. 3. No acute fracture or static subluxation of the cervical spine. Critical Value/emergent results were called by telephone at the time of interpretation on 04/20/2022 at 7:44 pm to provider [facility] , who verbally acknowledged these results. Electronically Signed   By: Ulyses Jarred M.D.   On: 04/20/2022 19:44   CT CERVICAL SPINE WO CONTRAST  Result Date: 04/20/2022 CLINICAL DATA:  New onset headache EXAM: CT HEAD WITHOUT  CONTRAST CT CERVICAL SPINE WITHOUT CONTRAST TECHNIQUE: Multidetector CT imaging of the head and cervical spine was performed following the standard protocol without intravenous contrast. Multiplanar CT image reconstructions of the cervical spine were also generated. RADIATION DOSE REDUCTION: This exam was performed according to the departmental dose-optimization program which includes automated exposure control, adjustment of the mA and/or kV according to patient size and/or use of iterative reconstruction technique. COMPARISON:  None Available. FINDINGS: CT HEAD FINDINGS Brain: There is an isodense left convexity subdural hematoma 20 mm in thickness. Likely small acute component anteriorly (axial image 13). There is a hypodense chronic right subdural hematoma, 7 mm. There is rightward midline shift of 4 mm.  Vascular: No abnormal hyperdensity of the major intracranial arteries or dural venous sinuses. No intracranial atherosclerosis. Skull: The visualized skull base, calvarium and extracranial soft tissues are normal. Sinuses/Orbits: No fluid levels or advanced mucosal thickening of the visualized paranasal sinuses. No mastoid or middle ear effusion. The orbits are normal. CT CERVICAL SPINE FINDINGS Alignment: Reversal of normal cervical lordosis Skull base and vertebrae: No acute fracture. Soft tissues and spinal canal: No prevertebral fluid or swelling. No visible canal hematoma. Disc levels: Multilevel facet arthrosis Upper chest: No pneumothorax, pulmonary nodule or pleural effusion. Other: Normal visualized paraspinal cervical soft tissues. IMPRESSION: 1. Isodense left convexity subdural hematoma measuring 20 mm in thickness with rightward midline shift of 4 mm. 2. Hypodense chronic right subdural hematoma, 7 mm. 3. No acute fracture or static subluxation of the cervical spine. Critical Value/emergent results were called by telephone at the time of interpretation on 04/20/2022 at 7:44 pm to provider [facility] , who verbally acknowledged these results. Electronically Signed   By: Ulyses Jarred M.D.   On: 04/20/2022 19:44   DG Shoulder Right  Result Date: 04/20/2022 CLINICAL DATA:  Fall EXAM: RIGHT SHOULDER - 2+ VIEW COMPARISON:  None Available. FINDINGS: There is a fracture of the humeral head with displacement of the greater tuberosity superiorly and medially. The fracture may also involve the anatomic neck. CT might be helpful for further definition of this finding. IMPRESSION: Humeral head fracture with displaced greater tuberosity. Cannot exclude a fracture involving the anatomic neck, and CT might be helpful. Electronically Signed   By: Sammie Bench M.D.   On: 04/20/2022 19:32      Subjective:  Patient seen and examined at bedside today.  Hemodynamically stable for discharge Discharge  Exam: Vitals:   04/23/22 0400 04/23/22 0638  BP: (!) 147/51 (!) 139/51  Pulse: (!) 58 (!) 58  Resp: 14 15  Temp:    SpO2: 92% 92%   Vitals:   04/23/22 0200 04/23/22 0300 04/23/22 0400 04/23/22 0638  BP: (!) 81/60 (!) 114/50 (!) 147/51 (!) 139/51  Pulse: 95 66 (!) 58 (!) 58  Resp: 19 14 14 15   Temp:      TempSrc:      SpO2: 97% 91% 92% 92%  Weight:      Height:        General: Pt is alert, awake, not in acute distress Cardiovascular: RRR, S1/S2 +, no rubs, no gallops Respiratory: CTA bilaterally, no wheezing, no rhonchi Abdominal: Soft, NT, ND, bowel sounds + Extremities: no edema, no cyanosis    The results of significant diagnostics from this hospitalization (including imaging, microbiology, ancillary and laboratory) are listed below for reference.     Microbiology: Recent Results (from the past 240 hour(s))  MRSA Next Gen by PCR, Nasal     Status: None  Collection Time: 04/21/22 12:38 AM   Specimen: Nasal Mucosa; Nasal Swab  Result Value Ref Range Status   MRSA by PCR Next Gen NOT DETECTED NOT DETECTED Final    Comment: (NOTE) The GeneXpert MRSA Assay (FDA approved for NASAL specimens only), is one component of a comprehensive MRSA colonization surveillance program. It is not intended to diagnose MRSA infection nor to guide or monitor treatment for MRSA infections. Test performance is not FDA approved in patients less than 61 years old. Performed at [facility], [address] [place],  [address]      Labs: BNP (last 3 results) No results for input(s): "BNP" in the last 8760 hours. Basic Metabolic Panel: Recent Labs  Lab 04/20/22 1903 04/20/22 2027 04/21/22 0418 04/22/22 0445  NA 141  --  141 140  K 3.9  --  4.2 3.9  CL 113*  --  114* 110  CO2 20*  --  21* 25  GLUCOSE 111*  --  167* 98  BUN 14  --  14 19  CREATININE 0.93  --  0.95 1.01*  CALCIUM 8.9  --  8.2* 8.4*  MG  --  2.0 2.0 1.8  PHOS  --  2.9 3.4 3.1   Liver Function  Tests: No results for input(s): "AST", "ALT", "ALKPHOS", "BILITOT", "PROT", "ALBUMIN" in the last 168 hours. No results for input(s): "LIPASE", "AMYLASE" in the last 168 hours. No results for input(s): "AMMONIA" in the last 168 hours. CBC: Recent Labs  Lab 04/20/22 1903 04/21/22 0418 04/22/22 0445 04/23/22 0928  WBC 12.7* 15.5* 10.5 9.7  HGB 10.1* 9.1* 8.0* 8.7*  HCT 32.0* 27.5* 24.6* 27.9*  MCV 95.2 90.5 91.8 95.2  PLT 229 237 170 179   Cardiac Enzymes: No results for input(s): "CKTOTAL", "CKMB", "CKMBINDEX", "TROPONINI" in the last 168 hours. BNP: Invalid input(s): "POCBNP" CBG: Recent Labs  Lab 04/22/22 0739 04/22/22 1209 04/22/22 1615 04/22/22 2140 04/23/22 0743  GLUCAP 73 126* 121* 110* 87   D-Dimer No results for input(s): "DDIMER" in the last 72 hours. Hgb A1c Recent Labs    04/21/22 0418  HGBA1C 5.7*   Lipid Profile No results for input(s): "CHOL", "HDL", "LDLCALC", "TRIG", "CHOLHDL", "LDLDIRECT" in the last 72 hours. Thyroid function studies No results for input(s): "TSH", "T4TOTAL", "T3FREE", "THYROIDAB" in the last 72 hours.  Invalid input(s): "FREET3" Anemia work up Recent Labs    04/23/22 0928  TIBC 447  IRON 20*   Urinalysis    Component Value Date/Time   COLORURINE YELLOW (A) 04/04/2019 1900   APPEARANCEUR CLOUDY (A) 04/04/2019 1900   LABSPEC 1.021 04/04/2019 1900   PHURINE 5.0 04/04/2019 1900   GLUCOSEU NEGATIVE 04/04/2019 1900   HGBUR SMALL (A) 04/04/2019 1900   BILIRUBINUR NEGATIVE 04/04/2019 1900   KETONESUR NEGATIVE 04/04/2019 1900   PROTEINUR 100 (A) 04/04/2019 1900   NITRITE NEGATIVE 04/04/2019 1900   LEUKOCYTESUR NEGATIVE 04/04/2019 1900   Sepsis Labs Recent Labs  Lab 04/20/22 1903 04/21/22 0418 04/22/22 0445 04/23/22 0928  WBC 12.7* 15.5* 10.5 9.7   Microbiology Recent Results (from the past 240 hour(s))  MRSA Next Gen by PCR, Nasal     Status: None   Collection Time: 04/21/22 12:38 AM   Specimen: Nasal Mucosa;  Nasal Swab  Result Value Ref Range Status   MRSA by PCR Next Gen NOT DETECTED NOT DETECTED Final    Comment: (NOTE) The GeneXpert MRSA Assay (FDA approved for NASAL specimens only), is one component of a comprehensive MRSA colonization  surveillance program. It is not intended to diagnose MRSA infection nor to guide or monitor treatment for MRSA infections. Test performance is not FDA approved in patients less than 61 years old. Performed at [facility], [address] [place], [place] [address]     Please note: You were cared for by a hospitalist during your hospital stay. Once you are discharged, your primary care physician will handle any further medical issues. Please note that NO REFILLS for any discharge medications will be authorized once you are discharged, as it is imperative that you return to your primary care physician (or establish a relationship with a primary care physician if you do not have one) for your post hospital discharge needs so that they can reassess your need for medications and monitor your lab values.    Time coordinating discharge: 40 minutes  SIGNED:   Shelly Coss, MD  [facility] 04/23/2022, 11:58 AM Pager [phone]  If 7PM-7AM, please contact night-coverage [email or web address] Password [email or web address]

## 2022-04-23 NOTE — TOC Transition Note (Signed)
Transition of Care [facility]) - CM/SW Discharge Note   Patient Details  Name: Tamara Whitehead MRN: [id number] Date of Birth: 1946/01/19  [facility]) CM/SW Contact:  [address], [place] Phone Number: 04/23/2022, 1:00 PM   Clinical Narrative:     CSW spoke with pt, she is agreeable to home PT/OT, she states she has no preference for a company. CSW provided Medicare.gov website for ratings and additional information as well as insurance auth process. Pt also requesting a bedside commode, MD made aware, order placed. CSW contacted [employer], spoke with [facility], requested commode, she states it will be delivered within the next two hours. CSW spoke with Tommi Rumps at [facility], agreeable to providing services for pt, referral made.     Barriers to Discharge: Continued Medical Work up   Patient Goals and CMS Choice Patient states their goals for this hospitalization and ongoing recovery are:: Patient would like to get some skilled nursing home rehab CMS Medicare.gov Compare Post Acute Care list provided to:: Patient Choice offered to / list presented to : Patient  Discharge Placement                       Discharge Plan and Services   Discharge Planning Services: CM Consult Post Acute Care Choice: Skilled Nursing Facility                               Social Determinants of Health (SDOH) Interventions     Readmission Risk Interventions     No data to display

## 2022-04-23 NOTE — Progress Notes (Signed)
IV infiltrated with iron infusion. Patient refused new IV start.

## 2022-04-23 NOTE — TOC Initial Note (Signed)
Transition of Care [facility]) - Initial/Assessment Note    Patient Details  Name: Tamara Whitehead MRN: [id number] Date of Birth: 10-01-1945  Transition of Care [facility]) CM/SW Contact:    [address], [place] Phone Number: 04/23/2022, 11:58 AM  Clinical Narrative:                  CSW attempted to contact pt's son in reference to [facility] recommendations, no answer, vm left. CSW attempted to speak with pt via phone twice, no answer. CSW will continue trying to reach pt's son.   Expected Discharge Plan: Skilled Nursing Facility Barriers to Discharge: Continued Medical Work up   Patient Goals and CMS Choice Patient states their goals for this hospitalization and ongoing recovery are:: Patient would like to get some skilled nursing home rehab CMS Medicare.gov Compare Post Acute Care list provided to:: Patient Choice offered to / list presented to : Patient  Expected Discharge Plan and Services Expected Discharge Plan: [place]   Discharge Planning Services: CM Consult Post Acute Care Choice: [place] Living arrangements for the past 2 months: Apartment Expected Discharge Date: 04/23/22                                    Prior Living Arrangements/Services Living arrangements for the past 2 months: Apartment Lives with:: Adult Children Patient language and need for interpreter reviewed:: Yes Do you feel safe going back to the place where you live?: Yes      Need for Family Participation in Patient Care: Yes (Comment) Care giver support system in place?: Yes (comment) Current home services: DME (cane) Criminal Activity/Legal Involvement Pertinent to Current Situation/Hospitalization: No - Comment as needed  Activities of Daily Living      Permission Sought/Granted Permission sought to share information with : Case Manager, Family Supports Permission granted to share information with : Yes, Verbal Permission Granted  Share Information with NAME:  Jakera Beaupre     Permission granted to share info w Relationship: son  Permission granted to share info w Contact Information: [phone]  Emotional Assessment Appearance:: Appears stated age Attitude/Demeanor/Rapport: Engaged Affect (typically observed): Accepting Orientation: : Oriented to Self, Oriented to Place, Oriented to  Time, Oriented to Situation Alcohol / Substance Use: Not Applicable Psych Involvement: No (comment)  Admission diagnosis:  Subdural hematoma ([place]) [S06.5XAA] Right leg weakness [R29.898] Closed fracture of proximal end of right humerus, unspecified fracture morphology, initial encounter [S42.201A] Patient Active Problem List   Diagnosis Date Noted   Subdural hematoma ([place]) 04/20/2022   Compression of brain ([place]) 04/20/2022   Anxiety and depression 08/24/2020   Hypertension    Rash 05/09/2019   Malnutrition of moderate degree 04/08/2019   Atrial flutter ([place])    Immunosuppressed status ([employer])    Skin lesion 11/07/2016   Allergic rhinitis 01/27/2015   Current tobacco use 01/27/2015   Hypercholesteremia 01/27/2015   Adult hypothyroidism 01/27/2015   Osteoarthrosis 01/27/2015   Malignant neoplasm of skin 01/27/2015   Rheumatoid arthritis ([place]) 10/22/2012   PCP:  Leone Haven, MD Pharmacy:   [facility], [address] [place] [address] Phone: [phone] Fax: [phone]  [id number] [place] [address] Phone: [phone] Fax: [phone]  [facility]  [place] [place] [address] Phone: [phone] Fax: [phone]     Social Determinants of Health (SDOH) Interventions    Readmission Risk Interventions     No data to display

## 2022-04-25 NOTE — Telephone Encounter (Signed)
Order placed for follow up CT. Has appt with Dr Izora Ribas on 05/24/22, study needs to be done a few days prior to that.

## 2022-04-25 NOTE — Telephone Encounter (Signed)
Patient called back and I went over her appts. She is unable to write so I mailed out a reminder letter.

## 2022-04-25 NOTE — Telephone Encounter (Signed)
No answer and no v/m to leave message.

## 2022-05-03 ENCOUNTER — Encounter: Payer: Medicare HMO | Admitting: Neurosurgery

## 2022-05-18 ENCOUNTER — Ambulatory Visit
Admission: RE | Admit: 2022-05-18 | Discharge: 2022-05-18 | Disposition: A | Discharge status: Home / Self Care | Payer: Medicare HMO | Source: Ambulatory Visit | Attending: Neurosurgery | Admitting: Neurosurgery

## 2022-05-18 DIAGNOSIS — S065XAA Traumatic subdural hemorrhage with loss of consciousness status unknown, initial encounter: Secondary | ICD-10-CM | POA: Insufficient documentation

## 2022-05-18 DIAGNOSIS — Z87828 Personal history of other (healed) physical injury and trauma: Secondary | ICD-10-CM | POA: Diagnosis not present

## 2022-05-18 DIAGNOSIS — I6200 Nontraumatic subdural hemorrhage, unspecified: Secondary | ICD-10-CM | POA: Diagnosis not present

## 2022-05-24 ENCOUNTER — Encounter: Payer: Medicare HMO | Admitting: Neurosurgery

## 2022-06-05 ENCOUNTER — Other Ambulatory Visit: Payer: Self-pay | Admitting: Family

## 2022-06-05 DIAGNOSIS — F419 Anxiety disorder, unspecified: Secondary | ICD-10-CM

## 2022-06-05 DIAGNOSIS — I4892 Unspecified atrial flutter: Secondary | ICD-10-CM

## 2022-06-05 DIAGNOSIS — E785 Hyperlipidemia, unspecified: Secondary | ICD-10-CM

## 2022-07-25 ENCOUNTER — Telehealth: Payer: Self-pay | Admitting: Family Medicine

## 2022-07-25 NOTE — Telephone Encounter (Signed)
I got a fax on this patient about her needing a bone density scan given her recent fracture.  It looks like she has not been seen by me in about a year and a half.  Can we get her scheduled for follow-up with me and we can discuss bone density scan at that time.

## 2022-07-26 NOTE — Telephone Encounter (Signed)
Noted  

## 2022-07-26 NOTE — Telephone Encounter (Signed)
Patient is scheduled for the fist available which is 08/31/22 at 12:00. I let the Patient know if something opens before that I will try to get her in.

## 2022-08-31 ENCOUNTER — Ambulatory Visit: Payer: Medicare HMO | Admitting: Family Medicine

## 2022-10-13 ENCOUNTER — Encounter: Payer: Self-pay | Admitting: *Deleted

## 2023-01-17 ENCOUNTER — Telehealth: Payer: Self-pay | Admitting: Family Medicine

## 2023-01-17 NOTE — Telephone Encounter (Signed)
Copied from CRM [id number]. Topic: Medicare AWV >> Jan 17, 2023 11:50 AM Payton Doughty wrote: Reason for CRM: Called 01/17/2023 to sched AWV - NO ANSWER  Verlee Rossetti; Care Guide Ambulatory Clinical Support [place] [facility] Direct Dial: [phone]

## 2023-06-07 ENCOUNTER — Telehealth: Payer: Self-pay | Admitting: Family Medicine

## 2023-06-07 NOTE — Telephone Encounter (Signed)
 Copied from CRM [id number]. Topic: Medicare AWV >> Jun 07, 2023 11:14 AM Juliana Ocean wrote: Reason for CRM: Called 06/06/2023 to sched AWV - NO VOICEMAIL  Rosalee Collins; Care Guide Ambulatory Clinical Support [facility] Direct Dial: [phone]

## 2023-08-02 ENCOUNTER — Telehealth: Payer: Self-pay | Admitting: Family Medicine

## 2023-08-02 NOTE — Telephone Encounter (Signed)
 Dr Birdie Sons is no longer at this location. Please call the office to schedule a Transfer of Care to either Dr Charlann Lange, Darleen Crocker or Kara Dies, NP. [id number] please schedule patient for transfer of care appointment.   Thank you

## 2023-11-06 DIAGNOSIS — M069 Rheumatoid arthritis, unspecified: Secondary | ICD-10-CM | POA: Diagnosis not present

## 2024-04-03 ENCOUNTER — Telehealth: Payer: Self-pay

## 2024-04-03 NOTE — Telephone Encounter (Signed)
 [facility] listed as PCP with [employer]. Patient will need an appt to establish care. If still being seen at [facility]- she will need to call and make appt there. She is overdue.

## 2024-04-08 NOTE — Telephone Encounter (Signed)
 Patient is on the [facility] list as not completing a PCP visit for 2025. PCP listed is Reyes Pines, MD with [facility] [place].   Patient declined due to recent move.   Updated patients address in the chart.

## 2024-04-09 NOTE — Telephone Encounter (Signed)
 open in error
